# Patient Record
Sex: Male | Born: 1935
Health system: Southern US, Community
[De-identification: ages and names within clinical notes are randomized; demographics above are authoritative.]

## PROBLEM LIST (undated history)

## (undated) DIAGNOSIS — J449 Chronic obstructive pulmonary disease, unspecified: Secondary | ICD-10-CM

## (undated) DIAGNOSIS — I2510 Atherosclerotic heart disease of native coronary artery without angina pectoris: Secondary | ICD-10-CM

## (undated) DIAGNOSIS — N400 Enlarged prostate without lower urinary tract symptoms: Secondary | ICD-10-CM

## (undated) DIAGNOSIS — H9190 Unspecified hearing loss, unspecified ear: Secondary | ICD-10-CM

## (undated) DIAGNOSIS — C169 Malignant neoplasm of stomach, unspecified: Secondary | ICD-10-CM

## (undated) DIAGNOSIS — B0229 Other postherpetic nervous system involvement: Secondary | ICD-10-CM

## (undated) DIAGNOSIS — M629 Disorder of muscle, unspecified: Secondary | ICD-10-CM

## (undated) DIAGNOSIS — D649 Anemia, unspecified: Secondary | ICD-10-CM

## (undated) HISTORY — DX: Malignant neoplasm of stomach, unspecified: C16.9

## (undated) HISTORY — DX: Anemia, unspecified: D64.9

## (undated) HISTORY — DX: Other postherpetic nervous system involvement: B02.29

## (undated) HISTORY — DX: Benign prostatic hyperplasia without lower urinary tract symptoms: N40.0

## (undated) HISTORY — PX: STOMACH SURGERY: SHX791

## (undated) HISTORY — DX: Chronic obstructive pulmonary disease, unspecified: J44.9

## (undated) HISTORY — DX: Atherosclerotic heart disease of native coronary artery without angina pectoris: I25.10

## (undated) HISTORY — DX: Disorder of muscle, unspecified: M62.9

---

## 1998-10-07 ENCOUNTER — Ambulatory Visit (HOSPITAL_COMMUNITY): Admission: RE | Admit: 1998-10-07 | Discharge: 1998-10-07 | Payer: Self-pay | Admitting: Internal Medicine

## 1998-10-07 ENCOUNTER — Encounter: Payer: Self-pay | Admitting: Internal Medicine

## 2001-05-18 ENCOUNTER — Encounter: Admission: RE | Admit: 2001-05-18 | Discharge: 2001-05-18 | Payer: Self-pay | Admitting: *Deleted

## 2001-05-18 ENCOUNTER — Encounter: Payer: Self-pay | Admitting: Cardiology

## 2001-05-30 ENCOUNTER — Ambulatory Visit: Admission: RE | Admit: 2001-05-30 | Discharge: 2001-05-30 | Payer: Self-pay | Admitting: Cardiology

## 2006-05-13 ENCOUNTER — Ambulatory Visit: Payer: Self-pay | Admitting: Internal Medicine

## 2006-05-24 ENCOUNTER — Ambulatory Visit (HOSPITAL_COMMUNITY): Admission: RE | Admit: 2006-05-24 | Discharge: 2006-05-24 | Payer: Self-pay | Admitting: Internal Medicine

## 2006-05-27 ENCOUNTER — Ambulatory Visit: Payer: Self-pay

## 2007-12-18 ENCOUNTER — Ambulatory Visit: Payer: Self-pay | Admitting: Internal Medicine

## 2007-12-18 DIAGNOSIS — J439 Emphysema, unspecified: Secondary | ICD-10-CM | POA: Insufficient documentation

## 2007-12-19 ENCOUNTER — Encounter: Payer: Self-pay | Admitting: Internal Medicine

## 2007-12-19 LAB — CONVERTED CEMR LAB
Glucose, Bld: 93 mg/dL (ref 70–99)
PSA: 1.96 ng/mL (ref 0.10–4.00)

## 2008-05-27 ENCOUNTER — Ambulatory Visit: Payer: Self-pay | Admitting: Internal Medicine

## 2008-07-07 ENCOUNTER — Telehealth: Payer: Self-pay | Admitting: Internal Medicine

## 2008-07-22 ENCOUNTER — Ambulatory Visit: Payer: Self-pay | Admitting: Internal Medicine

## 2008-07-22 DIAGNOSIS — B0229 Other postherpetic nervous system involvement: Secondary | ICD-10-CM

## 2008-08-08 ENCOUNTER — Telehealth: Payer: Self-pay | Admitting: Internal Medicine

## 2008-08-09 ENCOUNTER — Ambulatory Visit: Payer: Self-pay | Admitting: Internal Medicine

## 2008-08-15 ENCOUNTER — Telehealth: Payer: Self-pay | Admitting: Internal Medicine

## 2008-08-26 ENCOUNTER — Telehealth: Payer: Self-pay | Admitting: Internal Medicine

## 2008-09-24 ENCOUNTER — Telehealth: Payer: Self-pay | Admitting: Internal Medicine

## 2008-10-21 ENCOUNTER — Ambulatory Visit: Payer: Self-pay | Admitting: Internal Medicine

## 2008-10-30 ENCOUNTER — Telehealth (INDEPENDENT_AMBULATORY_CARE_PROVIDER_SITE_OTHER): Payer: Self-pay | Admitting: *Deleted

## 2008-11-29 ENCOUNTER — Ambulatory Visit: Payer: Self-pay | Admitting: Internal Medicine

## 2008-12-24 ENCOUNTER — Ambulatory Visit: Payer: Self-pay | Admitting: Internal Medicine

## 2009-07-01 ENCOUNTER — Ambulatory Visit: Payer: Self-pay | Admitting: Internal Medicine

## 2009-07-02 LAB — CONVERTED CEMR LAB
Albumin: 4.3 g/dL (ref 3.5–5.2)
BUN: 14 mg/dL (ref 6–23)
Basophils Absolute: 0 10*3/uL (ref 0.0–0.1)
Basophils Relative: 0.3 % (ref 0.0–3.0)
CO2: 31 meq/L (ref 19–32)
Calcium: 9.4 mg/dL (ref 8.4–10.5)
Chloride: 101 meq/L (ref 96–112)
Creatinine, Ser: 1 mg/dL (ref 0.4–1.5)
Eosinophils Absolute: 0.1 10*3/uL (ref 0.0–0.7)
Eosinophils Relative: 1.1 % (ref 0.0–5.0)
Glucose, Bld: 98 mg/dL (ref 70–99)
HCT: 44.8 % (ref 39.0–52.0)
Hemoglobin: 15.3 g/dL (ref 13.0–17.0)
Lymphocytes Relative: 29.1 % (ref 12.0–46.0)
Lymphs Abs: 2.2 10*3/uL (ref 0.7–4.0)
MCHC: 34 g/dL (ref 30.0–36.0)
MCV: 88.9 fL (ref 78.0–100.0)
Monocytes Absolute: 0.5 10*3/uL (ref 0.1–1.0)
Monocytes Relative: 6.3 % (ref 3.0–12.0)
Neutro Abs: 4.6 10*3/uL (ref 1.4–7.7)
Neutrophils Relative %: 63.2 % (ref 43.0–77.0)
PSA: 1.36 ng/mL (ref 0.10–4.00)
Phosphorus: 4 mg/dL (ref 2.3–4.6)
Platelets: 187 10*3/uL (ref 150.0–400.0)
Potassium: 4.6 meq/L (ref 3.5–5.1)
RBC: 5.04 M/uL (ref 4.22–5.81)
RDW: 14 % (ref 11.5–14.6)
Sodium: 140 meq/L (ref 135–145)
TSH: 0.47 microintl units/mL (ref 0.35–5.50)
WBC: 7.4 10*3/uL (ref 4.5–10.5)

## 2009-07-23 ENCOUNTER — Encounter: Payer: Self-pay | Admitting: Internal Medicine

## 2009-07-31 ENCOUNTER — Encounter: Payer: Self-pay | Admitting: Internal Medicine

## 2009-09-02 ENCOUNTER — Encounter: Payer: Self-pay | Admitting: Internal Medicine

## 2009-10-14 ENCOUNTER — Encounter: Payer: Self-pay | Admitting: Internal Medicine

## 2010-01-06 ENCOUNTER — Encounter: Payer: Self-pay | Admitting: Internal Medicine

## 2010-07-15 ENCOUNTER — Ambulatory Visit: Payer: Self-pay | Admitting: Internal Medicine

## 2010-11-25 NOTE — Letter (Addendum)
Summary: [facility]  [facility]   Imported By: Lanelle Bal 01/22/2010 12:11:06  _____________________________________________________________________  External Attachment:    Type:   Image     Comment:   External Document  Appended Document: [facility] doing well after medial canthal procedure. Good cosmetic effect

## 2010-11-25 NOTE — Miscellaneous (Addendum)
Summary: Special Procedure  Special Procedure   Imported By: Lester Dennis 07/23/2010 09:32:38  _____________________________________________________________________  External Attachment:    Type:   Image     Comment:   External Document

## 2010-11-25 NOTE — Miscellaneous (Addendum)
Summary: med list update  Clinical Lists Changes  Medications: Added new medication of ATROVENT HFA 17 MCG/ACT  AERS (IPRATROPIUM BROMIDE HFA) 2 sprays 3-4 times a day - Signed Rx of ATROVENT HFA 17 MCG/ACT  AERS (IPRATROPIUM BROMIDE HFA) 2 sprays 3-4 times a day;  #1 x 0;  Signed;  Entered by: Lowella Petties;  Authorized by: Cindee Salt MD;  Method used: Telephoned to [facility]  [address], [place], [place], [place]  [address], Ph: [phone], Fax: [phone]    Prescriptions: ATROVENT HFA 17 MCG/ACT  AERS (IPRATROPIUM BROMIDE HFA) 2 sprays 3-4 times a day  #1 x 0   Entered by:   Lowella Petties   Authorized by:   Cindee Salt MD   Signed by:   Lowella Petties on 05/27/2008   Method used:   Telephoned to ...       [facility]  [address]       [address]       [place], [place]  [address]       Ph: [phone]       Fax: [phone]   RxID:   [id number]    Prior Medications: ASPIRIN EC 325 MG  TBEC (ASPIRIN) Take 1 tablet by mouth once a day VITAMIN B () Take 1 tablet by mouth once a day ATROVENT HFA 17 MCG/ACT  AERS (IPRATROPIUM BROMIDE HFA) 2 sprays 3-4 times a day Current Allergies: No known allergies

## 2010-11-25 NOTE — Assessment & Plan Note (Addendum)
Summary: CHECK LESION ON COLLAR BONE   Vital Signs:  Patient profile:   75 year old male Height:      66.5 inches Weight:      127 pounds BMI:     20.26 Temp:     97.9 degrees F oral Pulse rate:   84 / minute Pulse rhythm:   regular BP sitting:   122 / 60  (left arm) Cuff size:   regular  Vitals Entered By: Delilah Shan CMA Duncan Dull) (July 15, 2010 12:05 PM) CC: Check collar bone   History of Present Illness: Has mass on left collarbone that started about 3 weeks ago Growing very fast No pain No bleeding No injury  Also has wart on right 2nd finger  Foot pain along lateral right foot No known injury No new shoes no swelling just turned red  bite on inner left thigh using rubbing alcohol still red  Allergies: No Known Drug Allergies  Past History:  Past medical, surgical, family and social histories (including risk factors) reviewed for relevance to current acute and chronic problems.  Past Medical History: Reviewed history from 11/29/2008 and no changes required. COPD Post herpetic neuralgia  Past Surgical History: Reviewed history from 12/18/2007 and no changes required. Stomach surgery for ulcers--age 83  Family History: Reviewed history from 12/18/2007 and no changes required. Doesn't know family medical history 2 brothers  Social History: Reviewed history from 12/18/2007 and no changes required. 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 Quit about 2004. Heavy in past  Review of Systems       feels well otherwise no GI problems  Physical Exam  General:  alert and normal appearance.   Msk:  right foot without joint swelling Lifelong crossing of 5th toe over 4th--not tender slight redness over 5th MTP but not tender normal passive ROM Skin:  lobulated mass over medial left clavicle 1.5 x 1.5 x 0.5 cm broad base but doesn't seem to be deep under skin no  inflammation   Impression & Recommendations:  Problem # 1:  NEOPLASMS UNSPEC NATURE BONE SOFT TISSUE&SKIN (ICD-239.2) Assessment New  Procedure sterile prep 2% lido with epi-- 3cc for local anesthesia Shave excision---to path cautery with hyfrecator covered with bandaid discussed home care  Lesion did not seem to be infiltrating the dermis very far  Orders: Shave Skin Lesion 1.1-2.0 cm/trunk/arm/leg ([address])  Problem # 2:  PAIN IN JOINT, ANKLE AND FOOT (ICD-719.47) Assessment: New seems mechanical nothing to point to gout no new shoes discussed supportive care  Problem # 3:  WART, RIGHT HAND (ICD-078.10) Assessment: New  liquid nitrogen Rx for 40 seconds x 2 tolerated well discussed home care  Orders: Wart Destruct <14 ([address])  Complete Medication List: 1)  Aspirin Ec 325 Mg Tbec (Aspirin) .... Take 1 tablet by mouth once a day 2)  Magnebind 400 200-1-400 Mg Tabs (Magnesium-calcium-folic acid) .... Tke one tablet once daily 3)  Spiriva Handihaler 18 Mcg Caps (Tiotropium bromide monohydrate) .Marland Kitchen.. 1 inhalation daily  Patient Instructions: 1)  Please schedule a follow-up appointment as needed .   Current Allergies (reviewed today): No known allergies

## 2010-11-25 NOTE — Letter (Addendum)
Summary: [facility]  [facility]   Imported By: Lanelle Bal 11/22/2009 09:43:55  _____________________________________________________________________  External Attachment:    Type:   Image     Comment:   External Document  Appended Document: [facility] post op may need "flap thinning" as minor procedure to finish up

## 2011-01-06 ENCOUNTER — Ambulatory Visit (INDEPENDENT_AMBULATORY_CARE_PROVIDER_SITE_OTHER): Payer: Medicare Other | Admitting: Internal Medicine

## 2011-01-06 ENCOUNTER — Encounter: Payer: Self-pay | Admitting: Internal Medicine

## 2011-01-06 DIAGNOSIS — J449 Chronic obstructive pulmonary disease, unspecified: Secondary | ICD-10-CM

## 2011-01-12 NOTE — Assessment & Plan Note (Addendum)
Summary: Med refill   Vital Signs:  Patient profile:   76 year old male Weight:      133 pounds O2 Sat:      95 % on Room air Temp:     98.0 degrees F oral Pulse rate:   78 / minute Pulse rhythm:   regular BP sitting:   138 / 68  (left arm) Cuff size:   regular  Vitals Entered By: Mervin Hack CMA Duncan Dull) (January 06, 2011 10:18 AM)  O2 Flow:  Room air CC: med refill   History of Present Illness: Doing okay No new skin problems  still on spiriva Has finally completely stopped cigarettes  Very tough Variable amount---but could be up to 1/4 PPD  No arthritis problems  Preventive Screening-Counseling & Management  Alcohol-Tobacco     Smoking Status: quit  Allergies: No Known Drug Allergies  Past History:  Past medical, surgical, family and social histories (including risk factors) reviewed for relevance to current acute and chronic problems.  Past Medical History: Reviewed history from 11/29/2008 and no changes required. COPD Post herpetic neuralgia  Past Surgical History: Reviewed history from 12/18/2007 and no changes required. Stomach surgery for ulcers--age 72  Family History: Reviewed history from 12/18/2007 and no changes required. 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 Quit about 2004. Heavy in past Former Smoker--now completely off as of 2/12  Review of Systems       sleeps well Tries to stay active but no set exercise appetite is fine weight is stable Voids okay  Contraindications/Deferment of Procedures/Staging:    Treatment: Flu Shot    Contraindication: other     Treatment: Pneumovax    Contraindication: other   Physical Exam  General:  alert and normal appearance.   Neck:  supple, no masses, no carotid bruits, and no cervical lymphadenopathy.  Thyroid palpable but not enlarged Lungs:  normal respiratory effort, no intercostal retractions, no  accessory muscle use, no crackles, and no wheezes.  SLightly decreased breath sounds but clear Heart:  normal rate, regular rhythm, no murmur, and no gallop.   Msk:  no joint tenderness and no joint swelling.   Extremities:  no edema Psych:  normally interactive, good eye contact, not anxious appearing, and not depressed appearing.     Impression & Recommendations:  Problem # 1:  COPD (ICD-496) Assessment Unchanged stable on meds He is interested in trying without the med discussed trial without it  discussed pneumovax and flu vaccine---he will not take  His updated medication list for this problem includes:    Spiriva Handihaler 18 Mcg Caps (Tiotropium bromide monohydrate) .Marland Kitchen... 1 inhalation daily  Complete Medication List: 1)  Aspirin Ec 325 Mg Tbec (Aspirin) .... Take 1 tablet by mouth once a day 2)  Spiriva Handihaler 18 Mcg Caps (Tiotropium bromide monohydrate) .Marland Kitchen.. 1 inhalation daily  Patient Instructions: 1)  Please schedule a follow-up appointment in 1 year.  Prescriptions: SPIRIVA HANDIHALER 18 MCG CAPS (TIOTROPIUM BROMIDE MONOHYDRATE) 1 inhalation daily  #30 x 11   Entered and Authorized by:   Cindee Salt MD   Signed by:   Cindee Salt MD on 01/06/2011   Method used:   Electronically to        [facility]  [address] (retail)       [address], [address]       [place], [place]  [address]       Ph: [phone]       Fax: [phone]   RxID:   [id number]    Orders Added: 1)  Est. Patient Level III [84132]    Current Allergies (reviewed today): No known allergies

## 2011-03-12 NOTE — Assessment & Plan Note (Signed)
[facility]                                   ON-CALL NOTE   NAME:FIEGL, RUDOLPH                        MRN:          [id number]  DATE:05/14/2006                            DOB:          [birth year]    TELEPHONE TRIAGE NOTE:   TIME RECEIVED:  2:15 p.m.   PATIENT NAME:  Danny Page.  The caller is the same.  He sees Dr. Jonny Ruiz.  Telephone [phone].   The patient was seen in Dr. Raphael Gibney office yesterday for shoulder pain.  He  was to have had a prescription for hydrocodone called in to the pharmacy,  but apparently it was not.  The patient checked back this morning and they  had no record of such a prescription.  He has used hydrocodone many times in  the past.  He has no drug allergies.  My answer is to call in Vicodin 5/500  to use as needed for pain, #30, with no refills, to the [facility] in [place]  at [phone].                                   Tera Mater. Clent Ridges, MD   SAF/MedQ  DD:  05/14/2006  DT:  05/14/2006  Job #:  [id number]   cc:   Corwin Levins, MD

## 2012-01-10 ENCOUNTER — Ambulatory Visit: Payer: Medicare Other | Admitting: Internal Medicine

## 2012-01-25 ENCOUNTER — Encounter: Payer: Self-pay | Admitting: Internal Medicine

## 2012-01-26 ENCOUNTER — Encounter: Payer: Self-pay | Admitting: Internal Medicine

## 2012-01-26 ENCOUNTER — Ambulatory Visit (INDEPENDENT_AMBULATORY_CARE_PROVIDER_SITE_OTHER): Payer: Medicare Other | Admitting: Internal Medicine

## 2012-01-26 VITALS — BP 120/70 | HR 100 | Temp 98.2°F | Ht 66.0 in | Wt 154.0 lb

## 2012-01-26 DIAGNOSIS — J449 Chronic obstructive pulmonary disease, unspecified: Secondary | ICD-10-CM

## 2012-01-26 DIAGNOSIS — Z0000 Encounter for general adult medical examination without abnormal findings: Secondary | ICD-10-CM

## 2012-01-26 DIAGNOSIS — B0229 Other postherpetic nervous system involvement: Secondary | ICD-10-CM

## 2012-01-26 LAB — HEPATIC FUNCTION PANEL
AST: 26 U/L (ref 0–37)
Alkaline Phosphatase: 85 U/L (ref 39–117)
Bilirubin, Direct: 0 mg/dL (ref 0.0–0.3)
Total Bilirubin: 0.3 mg/dL (ref 0.3–1.2)

## 2012-01-26 LAB — BASIC METABOLIC PANEL
BUN: 16 mg/dL (ref 6–23)
Calcium: 9.6 mg/dL (ref 8.4–10.5)
Creatinine, Ser: 1 mg/dL (ref 0.4–1.5)
GFR: 77.53 mL/min (ref >60.00)
Glucose, Bld: 127 mg/dL — ABNORMAL HIGH (ref 70–99)

## 2012-01-26 LAB — CBC WITH DIFFERENTIAL/PLATELET
Basophils Absolute: 0.1 10*3/uL (ref 0.0–0.1)
Eosinophils Relative: 1.3 % (ref 0.0–5.0)
Lymphocytes Relative: 31.6 % (ref 12.0–46.0)
Lymphs Abs: 2.1 10*3/uL (ref 0.7–4.0)
Monocytes Relative: 7.3 % (ref 3.0–12.0)
Neutrophils Relative %: 58.4 % (ref 43.0–77.0)
Platelets: 225 10*3/uL (ref 150.0–400.0)
RDW: 17.2 % — ABNORMAL HIGH (ref 11.5–14.6)
WBC: 6.7 10*3/uL (ref 4.5–10.5)

## 2012-01-26 NOTE — Progress Notes (Signed)
Subjective:    Patient ID: Danny Page, male    DOB: [birth year], 77 y.o.   MRN: [id number]  HPI Here for physical Did stop smoking completely No falls, depression or anhedonia Reviewed advanced directives Doesn't want immunizations  Discussed spiriva He doesn't feel it does any good---he has tried to skip several days No regular cough Breathing is stable. Has to rest after walking only 100 feet  Current Outpatient Prescriptions on File Prior to Visit  Medication Sig Dispense Refill  . aspirin 325 MG tablet Take 325 mg by mouth daily.      Marland Kitchen tiotropium (SPIRIVA) 18 MCG inhalation capsule Place 18 mcg into inhaler and inhale daily.        No Known Allergies  Past Medical History  Diagnosis Date  . COPD (chronic obstructive pulmonary disease)   . Post herpetic neuralgia     Past Surgical History  Procedure Date  . Stomach surgery     for ulcers, age 7    No family history on file.  History   Social History  . Marital Status: Single    Spouse Name: N/A    Number of Children: N/A  . Years of Education: N/A   Occupational History  . retired, Art gallery manager    Social History Main Topics  . Smoking status: Former Smoker    Types: Cigarettes  . Smokeless tobacco: Never Used  . Alcohol Use: No     quit about 2004, heavy in the past  . Drug Use: No  . Sexually Active: Not on file   Other Topics Concern  . Not on file   Social History Narrative   No living willRequests wife as health care POAWould accept resuscitation attemptsWould probably accept feeding tube   Review of Systems  Constitutional: Negative for fatigue and unexpected weight change.       Tries to do some exercise--- but not much Wears seat belt  HENT: Negative for hearing loss, congestion, rhinorrhea, dental problem and tinnitus.        Regular with dentist  Eyes: Negative for visual disturbance.       No diplopia or unilateral vision loss  Respiratory: Positive for shortness of breath.  Negative for cough and chest tightness.   Cardiovascular: Negative for chest pain, palpitations and leg swelling.  Gastrointestinal: Negative for nausea, vomiting, abdominal pain, constipation and blood in stool.       No heartburn  Genitourinary: Negative for dysuria, urgency, frequency and difficulty urinating.       Only occ nocturia No sexual problems  Musculoskeletal: Negative for back pain, joint swelling and arthralgias.  Skin: Negative for pallor and rash.  Neurological: Negative for dizziness, syncope, weakness, light-headedness, numbness and headaches.       Still feels mild sensory changes from shingles behind left ear  Hematological: Negative for adenopathy. Does not bruise/bleed easily.  Psychiatric/Behavioral: Negative for sleep disturbance and dysphoric mood. The patient is not nervous/anxious.        Objective:   Physical Exam  Constitutional: He is oriented to person, place, and time. He appears well-developed and well-nourished. No distress.  HENT:  Head: Normocephalic and atraumatic.  Right Ear: External ear normal.  Left Ear: External ear normal.  Mouth/Throat: Oropharynx is clear and moist. No oropharyngeal exudate.  Eyes: Conjunctivae and EOM are normal. Pupils are equal, round, and reactive to light.  Neck: Normal range of motion. Neck supple. No thyromegaly present.  Cardiovascular: Normal rate, regular rhythm and normal heart sounds.  Exam reveals no gallop.   No murmur heard.      Very faint pedal pulses  Pulmonary/Chest: Effort normal. No respiratory distress. He has no wheezes. He has no rales.       Decreased breath sounds but clear  Abdominal: Soft. He exhibits no mass. There is no tenderness.  Musculoskeletal: Normal range of motion. He exhibits no edema and no tenderness.  Lymphadenopathy:    He has no cervical adenopathy.  Neurological: He is alert and oriented to person, place, and time.  Skin: No rash noted. No erythema.       Multiple seb  keratoses  Psychiatric: He has a normal mood and affect. His behavior is normal. Thought content normal.          Assessment & Plan:

## 2012-01-26 NOTE — Assessment & Plan Note (Signed)
Mild persistent pain but much better

## 2012-01-26 NOTE — Patient Instructions (Signed)
Try stopping the spiriva for 2 weeks or so. If you don't notice any difference, it would be okay to stay off it

## 2012-01-26 NOTE — Assessment & Plan Note (Signed)
Mild to moderate disability Not sure spiriva is helping---will try 2 weeks off as trial

## 2012-01-26 NOTE — Assessment & Plan Note (Signed)
Generally healthy Doesn't want immunizations Colon due 2015 No PSA after discussion Will check labs

## 2012-01-27 ENCOUNTER — Encounter: Payer: Self-pay | Admitting: Internal Medicine

## 2012-01-27 ENCOUNTER — Other Ambulatory Visit: Payer: Self-pay | Admitting: Internal Medicine

## 2012-01-27 DIAGNOSIS — D509 Iron deficiency anemia, unspecified: Secondary | ICD-10-CM | POA: Insufficient documentation

## 2012-02-03 ENCOUNTER — Other Ambulatory Visit: Payer: Self-pay | Admitting: Internal Medicine

## 2012-02-14 ENCOUNTER — Encounter: Payer: Self-pay | Admitting: Internal Medicine

## 2012-02-15 ENCOUNTER — Ambulatory Visit: Payer: Medicare Other | Admitting: Internal Medicine

## 2012-02-17 ENCOUNTER — Ambulatory Visit (INDEPENDENT_AMBULATORY_CARE_PROVIDER_SITE_OTHER): Payer: Medicare Other | Admitting: Internal Medicine

## 2012-02-17 ENCOUNTER — Encounter: Payer: Self-pay | Admitting: Internal Medicine

## 2012-02-17 ENCOUNTER — Other Ambulatory Visit (INDEPENDENT_AMBULATORY_CARE_PROVIDER_SITE_OTHER): Payer: Medicare Other

## 2012-02-17 VITALS — BP 142/70 | HR 68 | Ht 66.0 in | Wt 152.6 lb

## 2012-02-17 DIAGNOSIS — D509 Iron deficiency anemia, unspecified: Secondary | ICD-10-CM

## 2012-02-17 DIAGNOSIS — D649 Anemia, unspecified: Secondary | ICD-10-CM

## 2012-02-17 LAB — IBC PANEL: Iron: 16 ug/dL — ABNORMAL LOW (ref 42–165)

## 2012-02-17 LAB — FERRITIN: Ferritin: 4.1 ng/mL — ABNORMAL LOW (ref 22.0–322.0)

## 2012-02-17 MED ORDER — INTEGRA F 125-1 MG PO CAPS: 1.0000 | ORAL_CAPSULE | Freq: Every day | ORAL | Status: DC

## 2012-02-17 NOTE — Patient Instructions (Signed)
Dr. Rhea Belton highly recommends an EGD and a colonoscopy to give a  better diagnosis.  If you reconsider please call our office at [phone]  We have sent the following medications to your pharmacy for you to pick up at your convenience: Integra, please take as prescribed  Your physician has requested that you go to the basement for lab work before leaving today.

## 2012-02-17 NOTE — Progress Notes (Signed)
Subjective:    Patient ID: Danny Page, male    DOB: 1935/04/27, 77 y.o.   MRN: [id number]  HPI Danny Page is a 77 yo male with PMH of COPD in consultation at the request of Dr. Alphonsus Sias for evaluation of a new microcytic anemia. The patient has no GI complaints today. No abdominal pain. No nausea or vomiting. No anorexia or weight change. No heartburn. No dysphagia or odynophagia. No change in bowel habits. No blood in his stool or melena. No fevers or chills. He recalls a prior colonoscopy with Dr. Kinnie Scales, and approximately 2005. We have attempted to obtain this report, but it cannot be tracked down. No other sign of blood loss including no hematuria.  Review of Systems As per history of present illness, otherwise negative  Patient Active Problem List  Diagnoses  . POSTHERPETIC NEURALGIA  . COPD  . Routine general medical examination at a health care facility  . Iron deficiency anemia   Past Surgical History  Procedure Date  . Stomach surgery     for ulcers, age 89   Current Outpatient Prescriptions  Medication Sig Dispense Refill  . aspirin 325 MG tablet Take 325 mg by mouth daily.      Marland Kitchen SPIRIVA HANDIHALER 18 MCG inhalation capsule INHALE ONE DOSE EVERY DAY  30 each  11  . Fe Fum-FePoly-FA-Vit C-Vit B3 (INTEGRA F) 125-1 MG CAPS Take 1 capsule by mouth daily.  30 capsule  4   No Known Allergies  Family History  Problem Relation Age of Onset  . Heart disease Brother    History  Substance Use Topics  . Smoking status: Former Smoker    Types: Cigarettes  . Smokeless tobacco: Never Used  . Alcohol Use: No     quit about 2004, heavy in the past       Objective:   Physical Exam BP 142/70  Pulse 68  Ht 5\' 6"  (1.676 m)  Wt 152 lb 9.6 oz (69.219 kg)  BMI 24.63 kg/m2 Constitutional: Well-developed and well-nourished. No distress. HEENT: Normocephalic and atraumatic. Oropharynx is clear and moist. No oropharyngeal exudate. Conjunctivae are normal. Pupils are equal round  and reactive to light. No scleral icterus. Neck: Neck supple. Trachea midline. Cardiovascular: Normal rate, regular rhythm and intact distal pulses. No M/R/G Pulmonary/chest: Effort normal and breath sounds normal. No wheezing, rales or rhonchi. Abdominal: Soft, nontender, nondistended. Well-healed midline abdominal scar. Bowel sounds active throughout. There are no masses palpable. No hepatosplenomegaly. Extremities: no clubbing, cyanosis, or edema Lymphadenopathy: No cervical adenopathy noted. Neurological: Alert and oriented to person place and time. Skin: Skin is warm and dry. No rashes noted. Psychiatric: Normal mood and affect. Behavior is normal.  CBC    Component Value Date/Time   WBC 6.7 01/26/2012 1307   RBC 4.43 01/26/2012 1307   HGB 10.3* 01/26/2012 1307   HCT 32.6* 01/26/2012 1307   PLT 225.0 01/26/2012 1307   MCV 73.6* 01/26/2012 1307   MCHC 31.5 01/26/2012 1307   RDW 17.2* 01/26/2012 1307   LYMPHSABS 2.1 01/26/2012 1307   MONOABS 0.5 01/26/2012 1307   EOSABS 0.1 01/26/2012 1307   BASOSABS 0.1 01/26/2012 1307    CMP     Component Value Date/Time   NA 150* 01/26/2012 1307   K 4.8 01/26/2012 1307   CL 109 01/26/2012 1307   CO2 28 01/26/2012 1307   GLUCOSE 127* 01/26/2012 1307   BUN 16 01/26/2012 1307   CREATININE 1.0 01/26/2012 1307   CALCIUM 9.6 01/26/2012  1307   PROT 7.1 01/26/2012 1307   ALBUMIN 3.9 01/26/2012 1307   AST 26 01/26/2012 1307   ALT 16 01/26/2012 1307   ALKPHOS 85 01/26/2012 1307   BILITOT 0.3 01/26/2012 1307       Assessment & Plan:  77 yo male with PMH of COPD in consultation at the request of Dr. Alphonsus Sias for evaluation of a new microcytic anemia.   1. Microcytic anemia -- the patient's labs are consistent with an anemia, there is microcytosis with a high RDW all indicative of iron deficiency. I would like to perform iron studies along with a TSH and celiac panel today. I have recommended EGD and colonoscopy for evaluation of iron deficiency anemia and search for a source for possible  chronic GI blood loss. The patient declines these procedures, and would prefer a much more conservative approach. I discussed how this anemia may indicate an occult blood source including malignancy, and he understands this and still declines EGD and colonoscopy. With this in mind, I will start him on Integra 1 tablet daily. I like for you to take this for 2 months, and then I will see him back to repeat a CBC and recheck his iron stores at that time. Of course, should he have overt bleeding, he is instructed to call us or his PCP urgently.

## 2012-02-17 NOTE — Progress Notes (Signed)
Addended by: Adonis Housekeeper A on: 02/17/2012 03:29 PM   Modules accepted: Orders

## 2012-02-18 LAB — IRON AND TIBC
Iron: 15 ug/dL — ABNORMAL LOW (ref 42–165)
UIBC: 412 ug/dL — ABNORMAL HIGH (ref 125–400)

## 2012-03-06 ENCOUNTER — Ambulatory Visit (INDEPENDENT_AMBULATORY_CARE_PROVIDER_SITE_OTHER): Payer: Medicare Other | Admitting: Internal Medicine

## 2012-03-06 ENCOUNTER — Encounter: Payer: Self-pay | Admitting: Internal Medicine

## 2012-03-06 VITALS — BP 120/70 | HR 103 | Temp 97.7°F | Wt 148.0 lb

## 2012-03-06 DIAGNOSIS — K922 Gastrointestinal hemorrhage, unspecified: Secondary | ICD-10-CM

## 2012-03-06 DIAGNOSIS — R42 Dizziness and giddiness: Secondary | ICD-10-CM

## 2012-03-06 NOTE — Assessment & Plan Note (Signed)
No evidence of stroke Classic vestibular symptoms Discussed using meclizine prn Not sure it this occurred due to some GI event

## 2012-03-06 NOTE — Patient Instructions (Addendum)
Please try meclizine 25mg  up to three times a day if the vertigo comes back  Please stop the iron and your aspirin for now

## 2012-03-06 NOTE — Assessment & Plan Note (Signed)
Clearly had change in stool color 3 weeks after starting iron and it is heme positive I discussed that he now needs to be studied to see where the blood is coming from and he agrees Stop the iron for now

## 2012-03-06 NOTE — Progress Notes (Signed)
Subjective:    Patient ID: Danny Page, male    DOB: Oct 15, 1935, 77 y.o.   MRN: [id number]  HPI Went to bathroom yesterday AM When he got up, he developed rotatory vertigo, felt dizzy and scalp was prickly Had sense of shortness of breath with his COPD Had to rush to chair to sit down  Fell asleep on couch after he was able to get Awoke a couple of hours later Ate some food okay. Had to move very slowly Took it easy the rest of the day  Still noticed the vertigo this AM Has been easing up  This AM, noted dark black stool No visible blood This is first time for this color despite starting the iron ~3 weeks ago Some dizziness if he gets up too quick  Chronic tinnitus Hearing is okay  No problems with coordination or balance Chronic knee and back pain  Current Outpatient Prescriptions on File Prior to Visit  Medication Sig Dispense Refill  . aspirin 325 MG tablet Take 325 mg by mouth daily.      . Fe Fum-FePoly-FA-Vit C-Vit B3 (INTEGRA F) 125-1 MG CAPS Take 1 capsule by mouth daily.  30 capsule  4  . SPIRIVA HANDIHALER 18 MCG inhalation capsule INHALE ONE DOSE EVERY DAY  30 each  11    No Known Allergies  Past Medical History  Diagnosis Date  . COPD (chronic obstructive pulmonary disease)   . Post herpetic neuralgia     Past Surgical History  Procedure Date  . Stomach surgery     for ulcers, age 89    Family History  Problem Relation Age of Onset  . Heart disease Brother     History   Social History  . Marital Status: Single    Spouse Name: N/A    Number of Children: N/A  . Years of Education: N/A   Occupational History  . retired, Art gallery manager    Social History Main Topics  . Smoking status: Former Smoker    Types: Cigarettes  . Smokeless tobacco: Never Used  . Alcohol Use: No     quit about 2004, heavy in the past  . Drug Use: No  . Sexually Active: Not on file   Other Topics Concern  . Not on file   Social History Narrative   No living  willRequests wife as health care POAWould accept resuscitation attemptsWould probably accept feeding tube   Review of Systems No fever No sinus problems    Objective:   Physical Exam  Constitutional: He appears well-developed and well-nourished. No distress.  Eyes: Conjunctivae and EOM are normal. Pupils are equal, round, and reactive to light.       Cataracts  Limited fundus view on right, sharp on left  Neck: Normal range of motion. Neck supple. No thyromegaly present.  Cardiovascular: Normal rate, regular rhythm and normal heart sounds.  Exam reveals no gallop.   No murmur heard. Pulmonary/Chest: Effort normal and breath sounds normal. No respiratory distress. He has no wheezes. He has no rales.  Abdominal: Soft. There is no tenderness.  Genitourinary:       Dark black stool which is mildly heme positive  Musculoskeletal: He exhibits no edema and no tenderness.  Lymphadenopathy:    He has no cervical adenopathy.  Neurological: He is alert. He has normal strength. He displays no atrophy and no tremor. No cranial nerve deficit. He exhibits normal muscle tone. He displays a negative Romberg sign. Coordination and gait normal.  Finger to nose normal  Psychiatric: He has a normal mood and affect. His behavior is normal.          Assessment & Plan:

## 2012-03-07 ENCOUNTER — Telehealth: Payer: Self-pay | Admitting: Gastroenterology

## 2012-03-07 ENCOUNTER — Telehealth: Payer: Self-pay | Admitting: *Deleted

## 2012-03-07 NOTE — Telephone Encounter (Signed)
appt already made.

## 2012-03-07 NOTE — Telephone Encounter (Signed)
Spoke to Pt. About scheduling his Endo/Colon, he was hesitant at first then decided to go ahead with them. I offered him 03/09/2012 but he said he would prefer next week if possible. Pt is scheduled for 03/16/2012 @ 3pm with a pre-visit on 03/14/2012

## 2012-03-07 NOTE — Telephone Encounter (Signed)
Message copied by Florene Glen on Tue Mar 07, 2012  3:12 PM ------      Message from: Beverley Fiedler      Created: Mon Mar 06, 2012  4:02 PM       Rich      Yes, we will arrange.            Stacy,       Please arrange for EGD and colonoscopy, same day. Next available.            ----- Message -----         From: Karie Schwalbe, MD         Sent: 03/06/2012   3:14 PM           To: Beverley Fiedler, MD            Vonna Kotyk,            He had a black stool yesterday after a vertigo episode and now has heme positive stools            Can you please set up his endoscopies?            I have told him to stop aspirin and the iron for now            [employer]

## 2012-03-14 ENCOUNTER — Ambulatory Visit (AMBULATORY_SURGERY_CENTER): Payer: Medicare Other

## 2012-03-14 ENCOUNTER — Encounter: Payer: Self-pay | Admitting: Internal Medicine

## 2012-03-14 VITALS — Ht 66.0 in | Wt 150.3 lb

## 2012-03-14 DIAGNOSIS — Z860100 Personal history of colon polyps, unspecified: Secondary | ICD-10-CM

## 2012-03-14 DIAGNOSIS — Z1211 Encounter for screening for malignant neoplasm of colon: Secondary | ICD-10-CM

## 2012-03-14 DIAGNOSIS — K922 Gastrointestinal hemorrhage, unspecified: Secondary | ICD-10-CM

## 2012-03-14 DIAGNOSIS — K921 Melena: Secondary | ICD-10-CM

## 2012-03-14 DIAGNOSIS — R195 Other fecal abnormalities: Secondary | ICD-10-CM

## 2012-03-14 MED ORDER — NA SULFATE-K SULFATE-MG SULF 17.5-3.13-1.6 GM/177ML PO SOLN: 1.0000 | Freq: Once | ORAL | Status: DC

## 2012-03-14 NOTE — Progress Notes (Signed)
Pt came into office today for his pre-visit prior to this colonoscopy with Dr Rhea Belton on 03/16/12. Pt states he had a colonoscopy done 7-8 years ago with Dr Meadows(?first name) A medical release form was filled out and given to [facility]. Ulis Rias RN

## 2012-03-16 ENCOUNTER — Other Ambulatory Visit: Payer: Self-pay | Admitting: *Deleted

## 2012-03-16 ENCOUNTER — Telehealth: Payer: Self-pay | Admitting: Gastroenterology

## 2012-03-16 ENCOUNTER — Telehealth: Payer: Self-pay | Admitting: *Deleted

## 2012-03-16 ENCOUNTER — Ambulatory Visit (AMBULATORY_SURGERY_CENTER): Payer: Medicare Other | Admitting: Internal Medicine

## 2012-03-16 ENCOUNTER — Other Ambulatory Visit (INDEPENDENT_AMBULATORY_CARE_PROVIDER_SITE_OTHER): Payer: Medicare Other

## 2012-03-16 ENCOUNTER — Encounter: Payer: Self-pay | Admitting: Internal Medicine

## 2012-03-16 VITALS — BP 160/73 | HR 86 | Temp 98.0°F | Resp 16 | Ht 66.0 in | Wt 150.0 lb

## 2012-03-16 DIAGNOSIS — K3189 Other diseases of stomach and duodenum: Secondary | ICD-10-CM

## 2012-03-16 DIAGNOSIS — K922 Gastrointestinal hemorrhage, unspecified: Secondary | ICD-10-CM

## 2012-03-16 DIAGNOSIS — Z86010 Personal history of colonic polyps: Secondary | ICD-10-CM

## 2012-03-16 DIAGNOSIS — D509 Iron deficiency anemia, unspecified: Secondary | ICD-10-CM

## 2012-03-16 DIAGNOSIS — D649 Anemia, unspecified: Secondary | ICD-10-CM

## 2012-03-16 DIAGNOSIS — K319 Disease of stomach and duodenum, unspecified: Secondary | ICD-10-CM

## 2012-03-16 DIAGNOSIS — R195 Other fecal abnormalities: Secondary | ICD-10-CM

## 2012-03-16 DIAGNOSIS — Z1211 Encounter for screening for malignant neoplasm of colon: Secondary | ICD-10-CM

## 2012-03-16 LAB — CBC WITH DIFFERENTIAL/PLATELET
Basophils Absolute: 0.1 10*3/uL (ref 0.0–0.1)
Basophils Relative: 1.8 % (ref 0.0–3.0)
HCT: 24.7 % — ABNORMAL LOW (ref 39.0–52.0)
Hemoglobin: 7.8 g/dL — CL (ref 13.0–17.0)
Lymphs Abs: 1.7 10*3/uL (ref 0.7–4.0)
Monocytes Relative: 8.2 % (ref 3.0–12.0)
Neutro Abs: 4.1 10*3/uL (ref 1.4–7.7)
RBC: 3.34 Mil/uL — ABNORMAL LOW (ref 4.22–5.81)
RDW: 22.6 % — ABNORMAL HIGH (ref 11.5–14.6)

## 2012-03-16 LAB — COMPREHENSIVE METABOLIC PANEL
ALT: 14 U/L (ref 0–53)
BUN: 13 mg/dL (ref 6–23)
CO2: 26 mEq/L (ref 19–32)
Calcium: 8.7 mg/dL (ref 8.4–10.5)
Creatinine, Ser: 1 mg/dL (ref 0.4–1.5)
GFR: 79.33 mL/min (ref >60.00)
Glucose, Bld: 108 mg/dL — ABNORMAL HIGH (ref 70–99)
Total Bilirubin: 0.4 mg/dL (ref 0.3–1.2)

## 2012-03-16 MED ORDER — SODIUM CHLORIDE 0.9 % IV SOLN: 500.0000 mL | INTRAVENOUS | Status: DC

## 2012-03-16 MED ORDER — OMEPRAZOLE 40 MG PO CPDR: 40.0000 mg | DELAYED_RELEASE_CAPSULE | Freq: Two times a day (BID) | ORAL | Status: DC

## 2012-03-16 NOTE — Progress Notes (Signed)
Patient did not experience any of the following events: a burn prior to discharge; a fall within the facility; wrong site/side/patient/procedure/implant event; or a hospital transfer or hospital admission upon discharge from the facility. (G8907) Patient did not have preoperative order for IV antibiotic SSI prophylaxis. (G8918)  

## 2012-03-16 NOTE — Op Note (Signed)
[facility] [address] [place], [place]  [address]  ENDOSCOPY PROCEDURE REPORT  PATIENT:  Danny Page, Danny Page  MR#:  [id number] BIRTHDATE:  12-Jan-1935, 76 yrs. old  GENDER:  male ENDOSCOPIST:  Carie Caddy. Janayia Burggraf, MD Referred by:  Tillman Abide, M.D. PROCEDURE DATE:  03/16/2012 PROCEDURE:  EGD with biopsy, 43239 ASA CLASS:  Class III INDICATIONS:  iron deficiency anemia, melena MEDICATIONS:   MAC sedation, administered by CRNA, propofol (Diprivan) 150 mg IV TOPICAL ANESTHETIC:  Cetacaine Spray  DESCRIPTION OF PROCEDURE:   After the risks benefits and alternatives of the procedure were thoroughly explained, informed consent was obtained.  The LB GIF-H180 [id number] endoscope was introduced through the mouth and advanced to the proximal jejunum, without limitations.  The instrument was slowly withdrawn as the mucosa was fully examined. <<PROCEDUREIMAGES>>  The esophagus and gastroesophageal junction were completely normal in appearance.  S/P partial gastrectomy with Billroth II anatomy was found. The gastrojejunal anastomosis was intact.  There were a few shallow erosions near the anastomosis. A mass, firm and friable, was found in the body of the stomach, extending from the anastomosis into the gastric cardia. Multiple biopsies were obtained and sent to pathology.    Retroflexed views revealed findings as previously described and a hiatal hernia.    The scope was then withdrawn from the patient and the procedure completed.  COMPLICATIONS:  None  ENDOSCOPIC IMPRESSION: 1) Normal esophagus 2) S/p partial gastrectomy with Billroth II anatomy. 3) Mass in the body of the stomach, extending from near the anastomosis into the gastric cardia.  Multiple biopsies taken. 4) A few gastric erosions 5) Small hiatus hernia  RECOMMENDATIONS: 1) Await pathology results, sent rush. 2) Avoid NSAIDs 3) CBC today 4) Begin omeprazole 40 mg twice daily.  Carie Caddy. Rhea Belton, MD  CC:  The  Patient Karie Schwalbe, MD  n. Rosalie DoctorCarie Caddy. Jahseh Lucchese at 03/16/2012 03:20 PM  Vella Kohler, [phone]

## 2012-03-16 NOTE — Op Note (Signed)
[facility] [address] [place], [place]  [address]  COLONOSCOPY PROCEDURE REPORT  PATIENT:  Danny, Page  MR#:  [id number] BIRTHDATE:  September 06, 1935, 76 yrs. old  GENDER:  male ENDOSCOPIST:  Carie Caddy. Amiliana Foutz, MD REF. BY:  Tillman Abide, M.D. PROCEDURE DATE:  03/16/2012 PROCEDURE:  Incomplete colonoscopy ASA CLASS:  Class III INDICATIONS:  melena, Iron deficiency anemia MEDICATIONS:   MAC sedation, administered by CRNA, propofol (Diprivan) 50 mg IV  DESCRIPTION OF PROCEDURE:   After the risks benefits and alternatives of the procedure were thoroughly explained, informed consent was obtained.  Digital rectal exam was performed and revealed Stool palpable on digital rectal examination and no rectal masses.   The LB CF-H180AL [id number] endoscope was introduced through the anus and advanced to the sigmoid colon, limited by poor preparation.    The quality of the prep was Suprep poor.  The instrument was then slowly withdrawn as the colon was fully examined. <<PROCEDUREIMAGES>>  FINDINGS:  Poor prep limited this examination and exam aborted in distal sigmoid due to inadequate bowel preparation.   Retroflexion was nor performed. The scope was then withdrawn from the sigmoid and the procedure completed.  COMPLICATIONS:  None  ENDOSCOPIC IMPRESSION: 1) Poor prep with thick black stool in the rectum and distal sigmoid.  RECOMMENDATIONS: 1) Avoid all NSAIDS. 2) CBC today 3) Await pathology from EGD. Repeat colonoscopy with better preparation.  Carie Caddy. Rhea Belton, MD  CC:  Karie Schwalbe, MD The Patient  n. eSIGNED:   Carie Caddy. Felipe Cabell at 03/16/2012 03:25 PM  Vella Kohler, [phone]

## 2012-03-16 NOTE — Telephone Encounter (Signed)
[place] lab called said pt's Hemoglobin is 7.8  Hematocrit is 24.7, I have made Dr. Rhea Belton aware.

## 2012-03-16 NOTE — Telephone Encounter (Signed)
Ordered CMET for CT scan tomorrow at [facility] at 09:30am. Instructions left with [facility] nurse.

## 2012-03-16 NOTE — Progress Notes (Signed)
1510 INCOMPLETE EXAM RELATED TO POOR PREP.

## 2012-03-16 NOTE — Patient Instructions (Signed)
Discharge instructions given with verbal understanding. Handouts on hiatal hernia given. Resume previous medications. Labs ordered for today. Dr. Margretta Sidle office will schedule repeat colonoscopy after biopsies result.YOU HAD AN ENDOSCOPIC PROCEDURE TODAY AT THE [facility]: Refer to the procedure report that was given to you for any specific questions about what was found during the examination.  If the procedure report does not answer your questions, please call your gastroenterologist to clarify.  If you requested that your care partner not be given the details of your procedure findings, then the procedure report has been included in a sealed envelope for you to review at your convenience later.  YOU SHOULD EXPECT: Some feelings of bloating in the abdomen. Passage of more gas than usual.  Walking can help get rid of the air that was put into your GI tract during the procedure and reduce the bloating. If you had a lower endoscopy (such as a colonoscopy or flexible sigmoidoscopy) you may notice spotting of blood in your stool or on the toilet paper. If you underwent a bowel prep for your procedure, then you may not have a normal bowel movement for a few days.  DIET: Your first meal following the procedure should be a light meal and then it is ok to progress to your normal diet.  A half-sandwich or bowl of soup is an example of a good first meal.  Heavy or fried foods are harder to digest and may make you feel nauseous or bloated.  Likewise meals heavy in dairy and vegetables can cause extra gas to form and this can also increase the bloating.  Drink plenty of fluids but you should avoid alcoholic beverages for 24 hours.  ACTIVITY: Your care partner should take you home directly after the procedure.  You should plan to take it easy, moving slowly for the rest of the day.  You can resume normal activity the day after the procedure however you should NOT DRIVE or use heavy machinery for 24 hours  (because of the sedation medicines used during the test).    SYMPTOMS TO REPORT IMMEDIATELY: A gastroenterologist can be reached at any hour.  During normal business hours, 8:30 AM to 5:00 PM Monday through Friday, call [phone].  After hours and on weekends, please call the GI answering service at [phone] who will take a message and have the physician on call contact you.   Following lower endoscopy (colonoscopy or flexible sigmoidoscopy):  Excessive amounts of blood in the stool  Significant tenderness or worsening of abdominal pains  Swelling of the abdomen that is new, acute  Fever of 100F or higher  Following upper endoscopy (EGD)  Vomiting of blood or coffee ground material  New chest pain or pain under the shoulder blades  Painful or persistently difficult swallowing  New shortness of breath  Fever of 100F or higher  Black, tarry-looking stools  FOLLOW UP: If any biopsies were taken you will be contacted by phone or by letter within the next 1-3 weeks.  Call your gastroenterologist if you have not heard about the biopsies in 3 weeks.  Our staff will call the home number listed on your records the next business day following your procedure to check on you and address any questions or concerns that you may have at that time regarding the information given to you following your procedure. This is a courtesy call and so if there is no answer at the home number and we have not heard from  you through the emergency physician on call, we will assume that you have returned to your regular daily activities without incident.  SIGNATURES/CONFIDENTIALITY: You and/or your care partner have signed paperwork which will be entered into your electronic medical record.  These signatures attest to the fact that that the information above on your After Visit Summary has been reviewed and is understood.  Full responsibility of the confidentiality of this discharge information lies with you  and/or your care-partner.

## 2012-03-17 ENCOUNTER — Telehealth: Payer: Self-pay | Admitting: *Deleted

## 2012-03-17 ENCOUNTER — Other Ambulatory Visit: Payer: Self-pay | Admitting: Internal Medicine

## 2012-03-17 ENCOUNTER — Inpatient Hospital Stay (HOSPITAL_COMMUNITY)
Admission: AD | Admit: 2012-03-17 | Discharge: 2012-03-17 | DRG: 812 | Disposition: A | Discharge status: Home / Self Care | Payer: Medicare Other | Source: Ambulatory Visit | Attending: Internal Medicine | Admitting: Internal Medicine

## 2012-03-17 ENCOUNTER — Encounter (HOSPITAL_COMMUNITY): Payer: Self-pay | Admitting: Nurse Practitioner

## 2012-03-17 ENCOUNTER — Ambulatory Visit (INDEPENDENT_AMBULATORY_CARE_PROVIDER_SITE_OTHER)
Admission: RE | Admit: 2012-03-17 | Discharge: 2012-03-17 | Disposition: A | Discharge status: Home / Self Care | Payer: Medicare Other | Source: Ambulatory Visit | Attending: Internal Medicine | Admitting: Internal Medicine

## 2012-03-17 DIAGNOSIS — J4489 Other specified chronic obstructive pulmonary disease: Secondary | ICD-10-CM | POA: Diagnosis present

## 2012-03-17 DIAGNOSIS — Z7982 Long term (current) use of aspirin: Secondary | ICD-10-CM

## 2012-03-17 DIAGNOSIS — D509 Iron deficiency anemia, unspecified: Principal | ICD-10-CM | POA: Diagnosis present

## 2012-03-17 DIAGNOSIS — K922 Gastrointestinal hemorrhage, unspecified: Secondary | ICD-10-CM

## 2012-03-17 DIAGNOSIS — J449 Chronic obstructive pulmonary disease, unspecified: Secondary | ICD-10-CM | POA: Diagnosis present

## 2012-03-17 DIAGNOSIS — C162 Malignant neoplasm of body of stomach: Secondary | ICD-10-CM | POA: Diagnosis present

## 2012-03-17 DIAGNOSIS — Z903 Acquired absence of stomach [part of]: Secondary | ICD-10-CM

## 2012-03-17 DIAGNOSIS — Z8711 Personal history of peptic ulcer disease: Secondary | ICD-10-CM

## 2012-03-17 DIAGNOSIS — D649 Anemia, unspecified: Secondary | ICD-10-CM

## 2012-03-17 DIAGNOSIS — C169 Malignant neoplasm of stomach, unspecified: Secondary | ICD-10-CM

## 2012-03-17 DIAGNOSIS — B0229 Other postherpetic nervous system involvement: Secondary | ICD-10-CM | POA: Diagnosis present

## 2012-03-17 LAB — ABO/RH: ABO/RH(D): A POS

## 2012-03-17 LAB — COMPREHENSIVE METABOLIC PANEL
AST: 13 U/L (ref 0–37)
Albumin: 3.4 g/dL — ABNORMAL LOW (ref 3.5–5.2)
Calcium: 8.1 mg/dL — ABNORMAL LOW (ref 8.4–10.5)
Creatinine, Ser: 1 mg/dL (ref 0.50–1.35)
Sodium: 134 mEq/L — ABNORMAL LOW (ref 135–145)
Total Protein: 6.4 g/dL (ref 6.0–8.3)

## 2012-03-17 LAB — CBC
MCV: 74.5 fL — ABNORMAL LOW (ref 78.0–100.0)
Platelets: 331 10*3/uL (ref 150–400)
RDW: 19.8 % — ABNORMAL HIGH (ref 11.5–15.5)
WBC: 6.7 10*3/uL (ref 4.0–10.5)

## 2012-03-17 LAB — PREPARE RBC (CROSSMATCH)

## 2012-03-17 LAB — DIFFERENTIAL
Basophils Absolute: 0.1 10*3/uL (ref 0.0–0.1)
Eosinophils Relative: 1 % (ref 0–5)
Lymphocytes Relative: 25 % (ref 12–46)

## 2012-03-17 MED ORDER — ONDANSETRON HCL 4 MG PO TABS: 4.0000 mg | ORAL_TABLET | Freq: Four times a day (QID) | ORAL | Status: DC | PRN

## 2012-03-17 MED ORDER — IOHEXOL 300 MG/ML  SOLN
100.0000 mL | Freq: Once | INTRAMUSCULAR | Status: AC | PRN
  Administered 2012-03-17: 100 mL via INTRAVENOUS

## 2012-03-17 MED ORDER — SODIUM CHLORIDE 0.9 % IJ SOLN: 3.0000 mL | Freq: Two times a day (BID) | INTRAMUSCULAR | Status: DC

## 2012-03-17 MED ORDER — SODIUM CHLORIDE 0.9 % IJ SOLN: 3.0000 mL | INTRAMUSCULAR | Status: DC | PRN

## 2012-03-17 MED ORDER — SODIUM CHLORIDE 0.9 % IV SOLN: 250.0000 mL | INTRAVENOUS | Status: DC | PRN

## 2012-03-17 MED ORDER — ONDANSETRON HCL 4 MG/2ML IJ SOLN: 4.0000 mg | Freq: Four times a day (QID) | INTRAMUSCULAR | Status: DC | PRN

## 2012-03-17 NOTE — Telephone Encounter (Signed)
Spoke with Danny Page this am to inform him he needs a blood transfusion. No room at medical day at [facility]. Called [facility] admitting who states there shouldn't be a problem with 24hr obs bed. Danny Page informed and if he has left for his CT, I'm to call his wife's cell, [phone].  Kim at [facility] admitting called back with Room 1502 at [facility], informed Danny Page's wife and Willette Cluster, NP. Transfusion orders in.

## 2012-03-17 NOTE — Telephone Encounter (Signed)
Notified wife of referral to [facility] and the appt. Also informed her someone from the cancer center will be calling for an appt; she stated understanding.

## 2012-03-17 NOTE — H&P (Signed)
Primary Care Physician:  Tillman Abide, MD, MD Primary Gastroenterologist:  Erick Blinks, MD  CHIEF COMPLAINT:  anemia  HPI: Danny Page is a 77 y.o. male seen by Dr. Rhea Belton late April for evaluation of anemia.  evaluation of black stools. Stools were black, heme positive. He subsequently underwent EGD and colonoscopy. He had thick black stool in colon. On EGD he had a firm mass extending from anastomosis into cardia. His anemia has progressed. Hemoglobin 7.8 yesterday, down from 10.3 one month ago. He is being admitted for observation to get a blood transfusion. Patient denies nausea, abdominal pain, or significant weight loss.   Past Medical History  Diagnosis Date  . COPD (chronic obstructive pulmonary disease)   . Post herpetic neuralgia   . Hemorrhoids     Past Surgical History  Procedure Date  . Bilroth II     for ulcers, age 33    Prior to Admission medications   Medication Sig Start Date End Date Taking? Authorizing Provider  aspirin 325 MG tablet Take 325 mg by mouth daily.    Historical Provider, MD  Fe Fum-FePoly-FA-Vit C-Vit B3 (INTEGRA F) 125-1 MG CAPS Take 1 capsule by mouth daily. 02/17/12   Beverley Fiedler, MD  omeprazole (PRILOSEC) 40 MG capsule Take 1 capsule (40 mg total) by mouth 2 (two) times daily. Please take 30 minutes before your first and last meal of the day. 03/16/12 03/16/13  Beverley Fiedler, MD  SPIRIVA HANDIHALER 18 MCG inhalation capsule INHALE ONE DOSE EVERY DAY 02/03/12   Karie Schwalbe, MD    Current Facility-Administered Medications  Medication Dose Route Frequency Provider Last Rate Last Dose  . ondansetron (ZOFRAN) tablet 4 mg  4 mg Oral Q6H PRN Meredith Pel, NP       Or  . ondansetron [facility]) injection 4 mg  4 mg Intravenous Q6H PRN Meredith Pel, NP      . DISCONTD: 0.9 %  sodium chloride infusion  250 mL Intravenous PRN Meredith Pel, NP      . DISCONTD: ondansetron (ZOFRAN) injection 4 mg  4 mg Intravenous Q6H PRN Meredith Pel, NP       . DISCONTD: ondansetron (ZOFRAN) tablet 4 mg  4 mg Oral Q6H PRN Meredith Pel, NP      . DISCONTD: sodium chloride 0.9 % injection 3 mL  3 mL Intravenous Q12H Meredith Pel, NP      . DISCONTD: sodium chloride 0.9 % injection 3 mL  3 mL Intravenous PRN Meredith Pel, NP       Facility-Administered Medications Ordered in Other Encounters  Medication Dose Route Frequency Provider Last Rate Last Dose  . iohexol (OMNIPAQUE) 300 MG/ML solution 100 mL  100 mL Intravenous Once PRN Medication Radiologist, MD   100 mL at 03/17/12 1003    Allergies as of 03/17/2012  . (No Known Allergies)    Family History  Problem Relation Age of Onset  . Heart disease Brother     History   Social History  . Marital Status: Married    Spouse Name: N/A    Number of Children: N/A  . Years of Education: N/A   Occupational History  . retired, Art gallery manager    Social History Main Topics  . Smoking status: Former Smoker -- 45 years    Types: Cigarettes    Quit date: 07/14/2008  . Smokeless tobacco: Never Used  . Alcohol Use: No     quit about 2004,  heavy in the past  . Drug Use: No  . Sexually Active: Not on file    Social History Narrative   No living willRequests wife as health care POAWould accept resuscitation attemptsWould probably accept feeding tube    Review of Systems: Positive for dizziness. All other systems reviewed and negative except where noted in HPI  Physical Exam: Vital signs in last 24 hours: Temp:  [97.5 F (36.4 C)-98 F (36.7 C)] 97.5 F (36.4 C) (05/24 1128) Pulse Rate:  [86-93] 93  (05/24 1128) Resp:  [16-17] 17  (05/24 1128) BP: (138-160)/(68-73) 138/68 mmHg (05/24 1128) SpO2:  [99 %] 99 % (05/24 1128) Weight:  [146 lb 6.2 oz (66.4 kg)-150 lb (68.04 kg)] 146 lb 6.2 oz (66.4 kg) (05/24 1128)   General:   Well-developed,white male in NAD Head:  Normocephalic and atraumatic. Eyes:  Sclera clear, no icterus.   Conjunctiva pale. Neck:  Supple; no  masses. Lungs:  Clear throughout to auscultation.   No wheezes, crackles, or rhonchi. No acute distress. Heart:  Regular rate and rhythm Abdomen:  Soft, nontender and nondistended. No masses, hepatosplenomegaly or hernias noted. Normal bowel sounds, without guarding, and without rebound.   Rectal:  Not done.   Msk:  Symmetrical without gross deformities. Pulses:  Normal pulses noted. Extremities:  Without clubbing or edema. Neurologic:  Alert and  oriented;  grossly normal neurologically. Skin:  Intact without significant lesions or rashes. Cervical Nodes:  No significant cervical adenopathy. Psych:  Alert and cooperative. Normal mood and affect.  Lab Results:  [facility] 03/16/12 1603  WBC 6.5  HGB 7.8 cL*  HCT 24.7 aL*  PLT 371.0   BMET  Basename 03/16/12 1603  NA 137  K 5.0  CL 102  CO2 26  GLUCOSE 108*  BUN 13  CREATININE 1.0  CALCIUM 8.7   LFT  Basename 03/16/12 1603  PROT 6.7  ALBUMIN 3.7  AST 18  ALT 14  ALKPHOS 76  BILITOT 0.4  BILIDIR --  IBILI --    Impression / Plan:   21. 77 year old male with newly diagnosed gastric cancer (just spoke with pathologist). Our office is arranging outpatient consultations with surgery and oncology. I did discuss diagnosis and management plans with patient and his wife.   2. Progressive iron deficiency anemia. Hemoglobin down almost 3 grams in one month. Patient to get transfused. No availability in short stay so will be admitted for observational stay. Hopefully home following transfusion.  3. History of Bilroth II age 65 for ulcer disease.   4. COPD, mild to moderate per PCP.    LOS: 0 days   Willette Cluster  03/17/2012, 11:52 AM

## 2012-03-17 NOTE — Telephone Encounter (Signed)
Message copied by Florene Glen on Fri Mar 17, 2012  2:49 PM ------      Message from: Marnette Burgess      Created: Fri Mar 17, 2012  2:17 PM      Regarding: Referral       Patient is scheduled for 03/21/12 @ 4:00pm, arrive @ 3:30pm.  Patient has been notified via voicemail.  If you have any questions please call [phone].            Thank You,      Elane Fritz      ----- Message -----         From: Linna Hoff, RN         Sent: 03/17/2012   1:59 PM           To: Rollen Sox, can you look at referral and give me an appt please. He has had a partial gastrectomy and Billroth II procedure if that makes any difference. Thanks.

## 2012-03-17 NOTE — Progress Notes (Signed)
UR completed 

## 2012-03-17 NOTE — Telephone Encounter (Signed)
No answer at telephone number given during admission. No answering machine.

## 2012-03-17 NOTE — Telephone Encounter (Signed)
Made referrals to [facility] and [facility] per Dr Rhea Belton

## 2012-03-17 NOTE — H&P (Signed)
Chart was reviewed and patient was examined. X-rays were reviewed.    I agree with management and plans.  Star Cheese D. Lawayne Hartig, M.D., FACG  

## 2012-03-18 LAB — TYPE AND SCREEN
Antibody Screen: NEGATIVE
Unit division: 0

## 2012-03-21 ENCOUNTER — Ambulatory Visit (INDEPENDENT_AMBULATORY_CARE_PROVIDER_SITE_OTHER): Payer: Medicare Other | Admitting: Surgery

## 2012-03-21 ENCOUNTER — Telehealth: Payer: Self-pay | Admitting: Oncology

## 2012-03-21 ENCOUNTER — Telehealth: Payer: Self-pay | Admitting: *Deleted

## 2012-03-21 ENCOUNTER — Encounter (INDEPENDENT_AMBULATORY_CARE_PROVIDER_SITE_OTHER): Payer: Self-pay | Admitting: Surgery

## 2012-03-21 VITALS — BP 108/62 | HR 88 | Temp 97.4°F | Resp 14 | Ht 66.0 in | Wt 147.4 lb

## 2012-03-21 DIAGNOSIS — C169 Malignant neoplasm of stomach, unspecified: Secondary | ICD-10-CM

## 2012-03-21 NOTE — Telephone Encounter (Signed)
lmonvm adviisng the pt of a new pt appt with dr Truett Perna for this Friday if he is available. Pt will need to call back to confirm

## 2012-03-21 NOTE — Progress Notes (Signed)
Patient ID: Danny Page, male   DOB: 06/11/1935, 77 y.o.   MRN: [id number]  Chief Complaint  Patient presents with  . New Evaluation    New Pt.     HPI Danny Page is a 77 y.o. male.   HPIPatient sent request of Dr. Rhea Belton for gastric cancer. He was found to have anemia and upper endoscopy showed a large mass in the cardia of the stomach which extended down to a Billroth II anastomosis. He had an ulcer he was 77 years old and this was done in [place] post World War II.  He denies any nausea, or vomiting or abdominal pain. Anemia was the only symptom.  Past Medical History  Diagnosis Date  . COPD (chronic obstructive pulmonary disease)   . Post herpetic neuralgia   . Hemorrhoids     Past Surgical History  Procedure Date  . Stomach surgery     for ulcers, age 27    Family History  Problem Relation Age of Onset  . Heart disease Brother     Social History History  Substance Use Topics  . Smoking status: Former Smoker -- 45 years    Types: Cigarettes    Quit date: 07/14/2008  . Smokeless tobacco: Never Used  . Alcohol Use: No     quit about 2004, heavy in the past    No Known Allergies  Current Outpatient Prescriptions  Medication Sig Dispense Refill  . SPIRIVA HANDIHALER 18 MCG inhalation capsule INHALE ONE DOSE EVERY DAY  30 each  11    Review of Systems Review of Systems  Constitutional: Negative.   HENT: Negative.   Eyes: Negative.   Respiratory: Negative.   Cardiovascular: Negative.   Gastrointestinal: Negative.   Genitourinary: Negative.   Musculoskeletal: Negative.   Neurological: Negative.   Hematological: Negative.   Psychiatric/Behavioral: Negative.     Blood pressure 108/62, pulse 88, temperature 97.4 F (36.3 C), temperature source Temporal, resp. rate 14, height 5\' 6"  (1.676 m), weight 147 lb 6.4 oz (66.86 kg).  Physical Exam Physical Exam  Constitutional: He is oriented to person, place, and time. He appears well-developed and  well-nourished.  HENT:  Head: Normocephalic and atraumatic.  Eyes: EOM are normal. Pupils are equal, round, and reactive to light.  Neck: Normal range of motion. Neck supple.  Cardiovascular: Normal rate and regular rhythm.   Pulmonary/Chest: Effort normal. He has wheezes.  Abdominal: Soft. Bowel sounds are normal. There is no tenderness.    Musculoskeletal: Normal range of motion.  Neurological: He is alert and oriented to person, place, and time.  Skin: Skin is warm and dry.  Psychiatric: He has a normal mood and affect. His behavior is normal. Judgment and thought content normal.    Data Reviewed CT scan  Abdomen pelvis no evidence of metastatic disease Endoscopy   Tumor from B2 to cardia of stomach.  Path invasive adenocarcinoma   Assessment    Gastric cancer Anemia Previous Bilroth 2 reconstruction.    Plan    I have asked Dr Donell Beers to see him due to location of tumor and he will probably  need completion gastrectomy and complex reconstruction.  He is not convinced he needs a tototal gastrectomy.  I drew him a picture to help explain the process.        Danny Page A. 03/21/2012, 4:46 PM

## 2012-03-21 NOTE — Telephone Encounter (Signed)
Received Oncology referral today.  Phone call to patient to introduce RN navigator role and give contact phone number.  Patient should receive call soon re: appointment with Oncology for this week.  Patient verbalized understanding.

## 2012-03-21 NOTE — Patient Instructions (Signed)
Gastric Cancer   Gastric cancer is a tumor which starts as a growth in your stomach. Cancer is a group of many related diseases that begin in cells, the building blocks of the body. Normally, cells grow and divide to produce more cells only when the body needs them. Sometimes, cells keep dividing when new cells are not needed. These extra cells may form a mass of tissue called a growth or tumor. Tumors can be either benign (not cancerous) or malignant (cancerous). Cancer can begin in any organ or tissue of the body. The original tumor (where the tumor started out) is called the primary cancer and is usually named for where it begins.   Several types of cancer can occur in the stomach. Adenocarcinoma is the most common, accounting for about 95% of gastric tumors. Other cancer types include carcinoid tumors, lymphoma or gastrointestinal stromal cell tumors (GISTs).   CAUSES   Though the exact cause of gastric cancer is not known, there are several known risk factors:   Age over 72.   Male sex.   Race: more common in Asian, Pacific Islander, Hispanic and African American people.   Diet high in smoked, salted or pickled foods.   Tobacco and alcohol use.   History of stomach surgery, chronic gastritis, gastric polyps or pernicious anemia.   Stomach infection with H. pylori bacteria (which also increases risk for ulcers).   Genetic factors including family history and blood type A.  Note that very few people with risk factors actually develop gastric cancer.  SYMPTOMS    Pain.   Loss of appetite.   Problems swallowing.   Nausea and vomiting.   Vomiting blood.   Abdominal pain.   Excessive gas or belching.   Weight loss.   General health problems.  DIAGNOSIS   Your caregiver may suspect gastric cancer based on your symptoms and your physical exam. Further testing can diagnose gastric cancer. This may include looking for blood in your stool. Gastroscopy (looking at your stomach through an instrument like a  thin flexible telescope; also called endoscopy) may also be done. Biopsies can be done if an abnormal growth is found. This is the removal of a small piece of tissue from your stomach if your caregiver notices abnormalities or growths there. The biopsy is looked at under a microscope by a specialist who can tell if cancer is present. If cancer is confirmed, other tests may be needed to see if the cancer has spread beyond the stomach.  TREATMENT    Surgical removal of the stomach (gastrectomy) is the only curative treatment. Sometimes only part of the stomach needs to be removed, depending on location of the cancer. Gastrectomy can be done if the cancer is found before it has spread beyond the stomach.   Radiation therapy and chemotherapy may be helpful. Chemotherapy and radiation therapy given after surgery may improve cure rates or make you feel better.   Antibiotics are sometimes used for H.pylori infection.   Advanced techniques to remove or destroy cancer without surgery are being researched.   Feeding tubes or bypass surgery may help if food becomes blocked.  The possibility of curing your gastric cancer depends on the type of tumor, the location of the tumor, and whether or not the tumor has spread beyond the stomach. If your cancer cannot be cured, treatment may slow the progression of the disease. Treatments are also available to address pain or other symptoms of cancer.  HOME CARE INSTRUCTIONS      Your caregiver may prescribe a specific type of diet. If you have had surgery, then a dietician may help you with an eating plan. Avoid red meats, processed meats, and salty, smoked or pickled foods.   Take any prescribed medications as directed. Do not use more pain medication than directed.   You do not need to limit your activity unless instructed by your caregiver.   Avoid alcohol and tobacco use.   Keep appointments for tests and with your caregiver or specialists.  SEEK MEDICAL CARE IF:    You have  problems eating.   You have problems tolerating your medications.   You continue to lose weight despite your treatments.  SEEK IMMEDIATE MEDICAL CARE IF:    You have uncontrolled nausea, vomiting or diarrhea.   You have vomiting with blood or coffee-grounds type material.   You have had chemotherapy and you have a fever.   You have uncontrolled pain.  Document Released: 07/15/2004 Document Revised: 09/30/2011 Document Reviewed: 10/22/2008  Exit[employer] Patient Information 2012 [employer].

## 2012-03-22 ENCOUNTER — Telehealth: Payer: Self-pay | Admitting: Oncology

## 2012-03-22 NOTE — Telephone Encounter (Signed)
S/w the pt and he is aware of his new pt appt with dr Truett Perna on 03/24/2012

## 2012-03-24 ENCOUNTER — Ambulatory Visit (HOSPITAL_BASED_OUTPATIENT_CLINIC_OR_DEPARTMENT_OTHER): Payer: Medicare Other | Admitting: Oncology

## 2012-03-24 ENCOUNTER — Ambulatory Visit: Payer: Medicare Other

## 2012-03-24 ENCOUNTER — Telehealth: Payer: Self-pay | Admitting: Oncology

## 2012-03-24 ENCOUNTER — Encounter: Payer: Self-pay | Admitting: Oncology

## 2012-03-24 ENCOUNTER — Ambulatory Visit: Payer: Medicare Other | Admitting: Oncology

## 2012-03-24 VITALS — BP 123/68 | HR 78 | Temp 97.0°F | Ht 66.0 in | Wt 146.2 lb

## 2012-03-24 DIAGNOSIS — D509 Iron deficiency anemia, unspecified: Secondary | ICD-10-CM

## 2012-03-24 DIAGNOSIS — C160 Malignant neoplasm of cardia: Secondary | ICD-10-CM

## 2012-03-24 DIAGNOSIS — L98499 Non-pressure chronic ulcer of skin of other sites with unspecified severity: Secondary | ICD-10-CM

## 2012-03-24 DIAGNOSIS — C169 Malignant neoplasm of stomach, unspecified: Secondary | ICD-10-CM

## 2012-03-24 DIAGNOSIS — J4489 Other specified chronic obstructive pulmonary disease: Secondary | ICD-10-CM

## 2012-03-24 DIAGNOSIS — J449 Chronic obstructive pulmonary disease, unspecified: Secondary | ICD-10-CM

## 2012-03-24 NOTE — Progress Notes (Signed)
[facility] New Patient Consult   Referring MD: Danny Page   Danny Page 76 y.o.  09/07/1935    Reason for Referral: Gastric cancer     HPI: He saw Dr. Letvak for evaluation of malaise and "dizziness ". A CBC on 01/26/2012 was remarkable for a hemoglobin of 10.3 with an MCV of 73.6.  He was referred to Dr. Pyrtle and underwent an upper endoscopy on may 23rd 2013. The esophagus and gastroesophageal junction appeared normal. He is status post a partial gastrectomy with a Billroth II anatomy. The gastrojejunal anastomosis was intact. A few shallow erosions were noted near the anastomosis. A firm mass was found in the body of the stomach extending from the anastomosis into the gastric cardia. Multiple biopsies were obtained. There was a small hiatal hernia. A colonoscopy on the same day was limited by poor preparation.  The biopsy from the stomach mass was positive for adenocarcinoma.  A CT of the abdomen and pelvis on 03/17/2012 revealed an enhancing mass at the medial wall of the stomach extending to the gastrojejunal anastomosis. No findings to suggest involvement of the small bowel or perigastric fat. No enlarged gastrohepatic or celiac nodes. No evidence of hepatic metastatic disease. The pancreas and spleen appeared normal. Small bilateral adrenal nodules were felt to be adenomatous. No mesenteric or retroperitoneal mass or adenopathy. The small bowel and colon were unremarkable. The lung bases were clear.  He was referred to Dr. Cornett. Dr. Cornett has referred the patient to Dr. Byerly for surgical oncology care.  The hemoglobin returned at 7.2 on may 24th 2013. He reports being transfused with 2 units of packed red blood cells. The fatigue and "dizziness "resolved following the transfusion.  Past Medical History  Diagnosis Date  . COPD (chronic obstructive pulmonary disease)       . Hemorrhoids   . Anemia-iron deficiency anemia   April 2013   . Stomach  cancer  may 23rd 2013    .    History of colon polyps  .    Zoster rash over the left face and neck 2-3 years ago Past Surgical History  Procedure Date  . Stomach surgery     for ulcers, age 18   .    Right knee surgery following trauma  .    Hemorrhoid surgery 10-15 years ago  Family History  Problem Relation Age of Onset  . Heart disease Brother     .   Breast cancer                                                                             Mother  Current outpatient prescriptions:SPIRIVA HANDIHALER 18 MCG inhalation capsule, INHALE ONE DOSE EVERY DAY, Disp: 30 each, Rfl: 11  Allergies: No Known Allergies  Social History: He lives in [place]. He previously worked as an engineer. No transfusion history prior to the May 2013 red cell transfusion. He was in the [employer], he denies risk factors for HIV and hepatitis    History  Alcohol Use No    quit about 2004, heavy in the past    History  Smoking status  . Former Smoker -- 45 years  .   Types: Cigarettes  . Quit date: 07/14/2008  Smokeless tobacco  . Never Used     ROS:   Positives include: Chronic exertional dyspnea, fatigue and dizziness prior to the red blood cell transfusion  A complete ROS was otherwise negative.  Physical Exam:  Blood pressure 123/68, pulse 78, temperature 97 F (36.1 C), temperature source Oral, height 5' 6" (1.676 m), weight 146 lb 3.2 oz (66.316 kg).  HEENT: Oropharynx without visible mass, neck without mass Lungs: And inspiratory rhonchi at the right base. No respiratory distress Cardiac: Regular rate and rhythm Abdomen: No mass, no hepatosplenomegaly, nontender, no apparent ascites GU: Testes without mass  Vascular: No leg edema Lymph nodes: No cervical, supraclavicular, axillary, or inguinal nodes Neurologic: Alert and oriented, the motor exam appears intact throughout Skin: Multiple benign appearing moles over the trunk Musculoskeletal: No spine  tenderness   LAB:  CBC  Lab Results  Component Value Date   WBC 6.7 03/17/2012   HGB 7.2* 03/17/2012   HCT 23.1* 03/17/2012   MCV 74.5* 03/17/2012   PLT 331 03/17/2012     CMP      Component Value Date/Time   NA 134* 03/17/2012 1209   K 4.3 03/17/2012 1209   CL 101 03/17/2012 1209   CO2 25 03/17/2012 1209   GLUCOSE 104* 03/17/2012 1209   BUN 15 03/17/2012 1209   CREATININE 1.00 03/17/2012 1209   CALCIUM 8.1* 03/17/2012 1209   PROT 6.4 03/17/2012 1209   ALBUMIN 3.4* 03/17/2012 1209   AST 13 03/17/2012 1209   ALT 8 03/17/2012 1209   ALKPHOS 76 03/17/2012 1209   BILITOT 0.2* 03/17/2012 1209   GFRNONAA 72* 03/17/2012 1209   GFRAA 83* 03/17/2012 1209     Radiology: As per history of present illness    Assessment/Plan:   1. Adenocarcinoma of the stomach-adenocarcinoma found in the gastric remnant, status post a previous partial gastrectomy/Billroth II anastomosis. No evidence of metastatic disease by physical exam or CT of the abdomen/pelvis  2. Remote partial gastrectomy/Billroth II anastomosis secondary to ulcer disease  3. Iron deficiency anemia secondary to the gastric mass, status post a red cell transfusion may 24th 2013  4. COPD  5. History of colon polyps     Disposition:   Danny Page has been diagnosed with adenocarcinoma of the stomach. I discussed the diagnosis, prognosis, and treatment options with the patient and his wife. He understands that most patients with "resectable "gastric cancer are treated with surgery, chemotherapy, and radiation. The sequencing of these modalities varies based on the clinical stage at presentation.   Surgery would potentially require a complete gastrectomy in his case. He stated that he is not interested in surgery if he has to undergo a complete gastrectomy. He has been referred to Dr. Byerly to make this determination.  We will make a decision on primary chemotherapy/radiation, neoadjuvant chemotherapy/radiation, or upfront surgery  after he is evaluated by Dr. Byerly and discussed at the GI tumor conference.  I referred him to Dr. Jacobs for a staging endoscopic ultrasound. He will also be scheduled for a CT of the chest.  Danny Page will return for an office visit in 2 weeks. I will recommend he begin iron replacement therapy.   Lydie Stammen 03/24/2012, 6:25 PM     

## 2012-03-24 NOTE — Telephone Encounter (Signed)
lmonvm adviisng the pt of his nutrition appt on 03/31/2012

## 2012-03-27 ENCOUNTER — Telehealth: Payer: Self-pay

## 2012-03-27 ENCOUNTER — Telehealth: Payer: Self-pay | Admitting: *Deleted

## 2012-03-27 ENCOUNTER — Other Ambulatory Visit: Payer: Self-pay

## 2012-03-27 ENCOUNTER — Telehealth (INDEPENDENT_AMBULATORY_CARE_PROVIDER_SITE_OTHER): Payer: Self-pay

## 2012-03-27 DIAGNOSIS — C169 Malignant neoplasm of stomach, unspecified: Secondary | ICD-10-CM

## 2012-03-27 NOTE — Telephone Encounter (Signed)
  patty Okay, let's set him up with upper endoscopic ultrasound radial plus minus linear scope. 60 minutes. Diagnosis gastric cancer staging. This Thursday morning. Does not need propofol.   Juleen Starr set this up. thanks

## 2012-03-27 NOTE — Telephone Encounter (Signed)
Danny Page from the cancer center called and advised that Dr Truett Perna the pt needs to have EUS this week if possible surgeon appt on Friday of this week.

## 2012-03-27 NOTE — Telephone Encounter (Signed)
Phone call to patient per request of Dr.Sherrill.  Dr. Truett Perna would like patient to begin Ferrous Sulfate 325 mg three times a day by mouth if not taking Iron already.  Patient stated he was not taking Ferrous Sulfate at present time.  Patient verbalized comprehension to begin the Ferrous Sulfate 325 mg three times a day.

## 2012-03-27 NOTE — Telephone Encounter (Signed)
Pt has been instructed and meds reviewed Instructions mailed to the home All the pts questions were answered

## 2012-03-27 NOTE — Telephone Encounter (Signed)
Pt on schedule to see Dr. Donell Beers 03/31/12 at 8:30 am.  Unable to reach him.  Will try again.

## 2012-03-28 ENCOUNTER — Telehealth: Payer: Self-pay | Admitting: Oncology

## 2012-03-28 NOTE — Telephone Encounter (Signed)
called pt and provided appt for ct scan on 06/06 @ [facility] and nutri and md vists for 06/12.

## 2012-03-28 NOTE — Discharge Summary (Signed)
[facility] Discharge Summary  Name: Danny Page MRN: [id number] DOB: 21-Apr-1935 77 y.o. PCP:  Tillman Abide, MD, MD  Date of Admission: 03/17/2012 10:42 AM Date of Discharge: 03/17/2012 Attending Physician: Melvia Heaps, MD Primary Gastroenterologist:  Erick Blinks, MD  Discharge Diagnosis: 1. Iron deficiency anemia 2. Gastric cancer, newly diagnosed 3. COPD  Consultations:  None  Procedures Performed:  Ct Abdomen Pelvis W Contrast  03/17/2012  *RADIOLOGY REPORT*  Clinical Data: GI bleed.  Gastric mass at endoscopy.  CT ABDOMEN AND PELVIS WITH CONTRAST  Technique:  Multidetector CT imaging of the abdomen and pelvis was performed following the standard protocol during bolus administration of intravenous contrast.  Contrast: OMNIPAQUE IOHEXOL 300 MG/ML  SOLN  Comparison: Recent endoscopy.  Findings: There is a enhancing mass along the medial wall of the stomach beginning just below the GE junction and extending down to the gastroc jejunostomy anastomoses.  It measures approximately 4.7 x 4.5 x 2.2 cm.  I do not see any findings to suggest involvement of the small bowel or perigastric fat.  There are no enlarged gastrohepatic were celiac axis lymph nodes.  No findings for hepatic metastatic disease.  Diffuse fatty infiltration of the liver is noted.  The pancreas is normal.  The spleen is normal.  Small bilateral adrenal gland nodules are likely benign adenomas.  No mesenteric or retroperitoneal mass or adenopathy.  The aorta demonstrates moderate atherosclerotic calcifications but no dissection or aneurysm.  The gallbladder is normal.  No common bile duct dilatation.  The small bowel and colon demonstrate no significant findings.  No mass lesions or inflammatory changes.  Scattered colonic diverticuli.  The appendix is normal.  The bladder, prostate gland and seminal vesicles are normal.  No pelvic mass, adenopathy or free pelvic fluid collections.  No inguinal mass or hernia.   The bony structures are intact.  The lung bases are clear.  IMPRESSION:  1.  5 cm gastric mass without findings for locally invasive disease, adenopathy or metastasis. 2.  Surgical changes from a partial gastrectomy and Billroth II. 3.  Diffuse fatty infiltration of the liver. 4.  Small bilateral adrenal gland nodules are likely benign adenomas. 5.  Atherosclerotic calcifications involving the aorta and branch vessels but no aneurysm.  Original Report Authenticated By: P. Loralie Champagne, M.D.    GI Procedures: none. EGD and colonoscopy done day prior to admission  History/Physical Exam:  See Admission H&P  Admission HPI:  Danny Page is a 77 y.o. male seen by Dr. Rhea Belton late April for evaluation of anemia. evaluation of black stools. Stools were black, heme positive. He subsequently underwent EGD and colonoscopy. He had thick black stool in colon. On EGD he had a firm mass extending from anastomosis into cardia. His anemia has progressed. Hemoglobin 7.8 yesterday, down from 10.3 one month ago. He is being admitted for observation to get a blood transfusion. Patient denies nausea, abdominal pain, or significant weight loss   Hospital Course by problem list: 1. Iron deficiency anemia secondary to #2.Marland Kitchen Hemoglobin 7.2 on admission, down from 10.3 in April. MCV 74. No availability in Short Stay, patient therefore admitted to medical floor blood transfusion. Patient was uneventfully transfused 2 units of PRBCs. He was discharged home on the evening of admission.   2. Gastric adenocarcinoma, newly diagnosed on EGD with biopsy 03/16/12. Dr.Pyrtle, patient's primary GI physician, is arranging for outpatient evaluations with surgery and oncology. CTscan of abdomen and pelvis this admission done to look for metastatic disease.  There was no evidence for locally invasive disease, adenopathy or metastasis.    3. COPD, no active issues this admission   Discharge Vitals:  BP 132/60  Pulse 88  Temp(Src) 98.6  F (37 C) (Oral)  Resp 18  Ht 5\' 6"  (1.676 m)  Wt 146 lb 6.2 oz (66.4 kg)  BMI 23.63 kg/m2  SpO2 96%  Discharge Labs: No results found for this or any previous visit (from the past 24 hour(s)).  Disposition and follow-up:   Danny Page was discharged from [facility] in stable condition.    Follow-up Appointments: Discharge Orders    Future Appointments: Provider: Department: Dept Phone: Center:   03/30/2012 2:30 PM [facility] [phone] [place]   03/31/2012 8:30 AM Almond Lint, MD [facility] [place] [phone] None   04/05/2012 11:15 AM Anabel Bene, RD [facility] [phone] None   04/05/2012 12:00 PM Ladene Artist, MD [facility] [phone] None   04/17/2012 11:00 AM Beverley Fiedler, MD [facility] Office [phone] [facility]      Discharge Medications: Medication List  As of 03/28/2012  3:28 PM   ASK your doctor about these medications         SPIRIVA HANDIHALER 18 MCG inhalation capsule   Generic drug: tiotropium   INHALE ONE DOSE EVERY DAY            Signed: Willette Cluster 03/28/2012, 3:28 PM

## 2012-03-28 NOTE — Telephone Encounter (Signed)
called pt and informed him that his appt on 06/07 was moved to 06/10

## 2012-03-30 ENCOUNTER — Ambulatory Visit (HOSPITAL_COMMUNITY)
Admission: RE | Admit: 2012-03-30 | Discharge: 2012-03-30 | Disposition: A | Discharge status: Home / Self Care | Payer: Medicare Other | Source: Ambulatory Visit | Attending: Oncology | Admitting: Oncology

## 2012-03-30 ENCOUNTER — Encounter (HOSPITAL_COMMUNITY): Payer: Self-pay | Admitting: *Deleted

## 2012-03-30 ENCOUNTER — Ambulatory Visit (HOSPITAL_COMMUNITY)
Admission: RE | Admit: 2012-03-30 | Discharge: 2012-03-30 | Disposition: A | Discharge status: Home / Self Care | Payer: Medicare Other | Source: Ambulatory Visit | Attending: Gastroenterology | Admitting: Gastroenterology

## 2012-03-30 ENCOUNTER — Encounter (HOSPITAL_COMMUNITY)
Admission: RE | Disposition: A | Discharge status: Home / Self Care | Payer: Self-pay | Source: Ambulatory Visit | Attending: Gastroenterology

## 2012-03-30 DIAGNOSIS — J4489 Other specified chronic obstructive pulmonary disease: Secondary | ICD-10-CM | POA: Insufficient documentation

## 2012-03-30 DIAGNOSIS — Z860100 Personal history of colon polyps, unspecified: Secondary | ICD-10-CM | POA: Insufficient documentation

## 2012-03-30 DIAGNOSIS — J449 Chronic obstructive pulmonary disease, unspecified: Secondary | ICD-10-CM | POA: Insufficient documentation

## 2012-03-30 DIAGNOSIS — C169 Malignant neoplasm of stomach, unspecified: Secondary | ICD-10-CM

## 2012-03-30 DIAGNOSIS — Z980 Intestinal bypass and anastomosis status: Secondary | ICD-10-CM | POA: Insufficient documentation

## 2012-03-30 DIAGNOSIS — D509 Iron deficiency anemia, unspecified: Secondary | ICD-10-CM | POA: Insufficient documentation

## 2012-03-30 DIAGNOSIS — Z8711 Personal history of peptic ulcer disease: Secondary | ICD-10-CM | POA: Insufficient documentation

## 2012-03-30 HISTORY — PX: EUS: SHX5427

## 2012-03-30 SURGERY — UPPER ENDOSCOPIC ULTRASOUND (EUS) LINEAR
Anesthesia: Moderate Sedation

## 2012-03-30 MED ORDER — SODIUM CHLORIDE 0.9 % IV SOLN
INTRAVENOUS | Status: DC
  Administered 2012-03-30: 500 mL via INTRAVENOUS

## 2012-03-30 MED ORDER — MIDAZOLAM HCL 10 MG/2ML IJ SOLN
INTRAMUSCULAR | Status: AC
  Filled 2012-03-30: qty 4

## 2012-03-30 MED ORDER — MIDAZOLAM HCL 10 MG/2ML IJ SOLN
INTRAMUSCULAR | Status: DC | PRN
  Administered 2012-03-30 (×2): 1 mg via INTRAVENOUS
  Administered 2012-03-30: 2 mg via INTRAVENOUS
  Administered 2012-03-30: 1 mg via INTRAVENOUS
  Administered 2012-03-30: 2 mg via INTRAVENOUS

## 2012-03-30 MED ORDER — DIPHENHYDRAMINE HCL 50 MG/ML IJ SOLN
INTRAMUSCULAR | Status: AC
  Filled 2012-03-30: qty 1

## 2012-03-30 MED ORDER — FENTANYL CITRATE 0.05 MG/ML IJ SOLN
INTRAMUSCULAR | Status: DC | PRN
  Administered 2012-03-30 (×3): 25 ug via INTRAVENOUS

## 2012-03-30 MED ORDER — FENTANYL CITRATE 0.05 MG/ML IJ SOLN
INTRAMUSCULAR | Status: AC
  Filled 2012-03-30: qty 4

## 2012-03-30 NOTE — Discharge Instructions (Signed)

## 2012-03-30 NOTE — Op Note (Signed)
[facility] [address] [place], [place]  [address]  ENDOSCOPIC ULTRASOUND PROCEDURE REPORT  PATIENT:  Danny Page, Danny Page  MR#:  [id number] BIRTHDATE:  1935-04-30  GENDER:  male ENDOSCOPIST:  Rachael Fee, MD REFERRED BY:  Lavada Mesi. Truett Perna, M.D. PROCEDURE DATE:  03/30/2012 PROCEDURE:  Upper EUS ASA CLASS:  Class III INDICATIONS:  recently diagnosed gastric adenocarcinoma, remote Bilroth I for ulcer disease ([place] 1950s) MEDICATIONS:   Fentanyl 75 mcg IV, These medications were titrated to patient response per physician's verbal order, Versed 7 mg IV  DESCRIPTION OF PROCEDURE:   After the risks benefits and alternatives of the procedure were  explained, informed consent was obtained. The patient was then placed in the left, lateral, decubitus postion and IV sedation was administered. Throughout the procedure, the patient's blood pressure, pulse and oxygen saturations were monitored continuously.  Under direct visualization, the 110036 endoscope was introduced through the mouth and advanced to the proximal jejunum.  Water was used as necessary to provide an acoustic interface.  Upon completion of the imaging, water was removed and the patient was sent to the recovery room in satisfactory condition. <<PROCEDUREIMAGES>>  Endoscopic findings: 1. Normal esophagus 2. Malignant mass in remnant stomach along posterior wall. The mass is approximately 4cm endoscopically, non-circumferential, non-obstrucing, extends from just below the GE junction to within 1-2cm of Bilroth I anastomosis.  EUS findings: 1. The mass above correlated with a 4.2cm length, 9mm thick, hypochoic mass that clearly passes into and through the muscularis propria layer of the gastric wall (T3). 2.  No perigastric adenopathy (N0)  Impression: uT3N0 (stage IIa) 4.2cm long, 9mm thick gastric adenocarcinoma in remnant stomach following remote Bilroth I surgery for ulcer disease ([place] in 48s).   He understands that best chance for cure is a completion gastrectomy  (perhaps after neoadjuvant chemo/XRT?) but is not sure if he wants to undergo the surgery due to likely morbidities.  He is meeting with Dr. Donell Beers tomorrow to discuss further.  ______________________________ Rachael Fee, MD  n. eSIGNED:   Rachael Fee at 03/30/2012 10:08 AM  Vella Kohler, [phone]

## 2012-03-30 NOTE — H&P (View-Only) (Signed)
[facility] New Patient Consult   Referring MD: Bing Duffey 77 y.o.  06/29/1935    Reason for Referral: Gastric cancer     HPI: He saw Dr. Alphonsus Sias for evaluation of malaise and "dizziness ". A CBC on 01/26/2012 was remarkable for a hemoglobin of 10.3 with an MCV of 73.6.  He was referred to Dr. Rhea Belton and underwent an upper endoscopy on may 23rd 2013. The esophagus and gastroesophageal junction appeared normal. He is status post a partial gastrectomy with a Billroth II anatomy. The gastrojejunal anastomosis was intact. A few shallow erosions were noted near the anastomosis. A firm mass was found in the body of the stomach extending from the anastomosis into the gastric cardia. Multiple biopsies were obtained. There was a small hiatal hernia. A colonoscopy on the same day was limited by poor preparation.  The biopsy from the stomach mass was positive for adenocarcinoma.  A CT of the abdomen and pelvis on 03/17/2012 revealed an enhancing mass at the medial wall of the stomach extending to the gastrojejunal anastomosis. No findings to suggest involvement of the small bowel or perigastric fat. No enlarged gastrohepatic or celiac nodes. No evidence of hepatic metastatic disease. The pancreas and spleen appeared normal. Small bilateral adrenal nodules were felt to be adenomatous. No mesenteric or retroperitoneal mass or adenopathy. The small bowel and colon were unremarkable. The lung bases were clear.  He was referred to Dr. Luisa Hart. Dr. Luisa Hart has referred the patient to Dr. Donell Beers for surgical oncology care.  The hemoglobin returned at 7.2 on may 24th 2013. He reports being transfused with 2 units of packed red blood cells. The fatigue and "dizziness "resolved following the transfusion.  Past Medical History  Diagnosis Date  . COPD (chronic obstructive pulmonary disease)       . Hemorrhoids   . Anemia-iron deficiency anemia   April 2013   . Stomach  cancer  may 23rd 2013    .    History of colon polyps  .    Zoster rash over the left face and neck 2-3 years ago Past Surgical History  Procedure Date  . Stomach surgery     for ulcers, age 84   .    Right knee surgery following trauma  .    Hemorrhoid surgery 10-15 years ago  Family History  Problem Relation Age of Onset  . Heart disease Brother     .   Breast cancer                                                                             Mother  Current outpatient prescriptions:SPIRIVA HANDIHALER 18 MCG inhalation capsule, INHALE ONE DOSE EVERY DAY, Disp: 30 each, Rfl: 11  Allergies: No Known Allergies  Social History: He lives in [place]. He previously worked as an Art gallery manager. No transfusion history prior to the May 2013 red cell transfusion. He was in the [employer], he denies risk factors for HIV and hepatitis    History  Alcohol Use No    quit about 2004, heavy in the past    History  Smoking status  . Former Smoker -- 45 years  .  Types: Cigarettes  . Quit date: 07/14/2008  Smokeless tobacco  . Never Used     ROS:   Positives include: Chronic exertional dyspnea, fatigue and dizziness prior to the red blood cell transfusion  A complete ROS was otherwise negative.  Physical Exam:  Blood pressure 123/68, pulse 78, temperature 97 F (36.1 C), temperature source Oral, height 5\' 6"  (1.676 m), weight 146 lb 3.2 oz (66.316 kg).  HEENT: Oropharynx without visible mass, neck without mass Lungs: And inspiratory rhonchi at the right base. No respiratory distress Cardiac: Regular rate and rhythm Abdomen: No mass, no hepatosplenomegaly, nontender, no apparent ascites GU: Testes without mass  Vascular: No leg edema Lymph nodes: No cervical, supraclavicular, axillary, or inguinal nodes Neurologic: Alert and oriented, the motor exam appears intact throughout Skin: Multiple benign appearing moles over the trunk Musculoskeletal: No spine  tenderness   LAB:  CBC  Lab Results  Component Value Date   WBC 6.7 03/17/2012   HGB 7.2* 03/17/2012   HCT 23.1* 03/17/2012   MCV 74.5* 03/17/2012   PLT 331 03/17/2012     CMP      Component Value Date/Time   NA 134* 03/17/2012 1209   K 4.3 03/17/2012 1209   CL 101 03/17/2012 1209   CO2 25 03/17/2012 1209   GLUCOSE 104* 03/17/2012 1209   BUN 15 03/17/2012 1209   CREATININE 1.00 03/17/2012 1209   CALCIUM 8.1* 03/17/2012 1209   PROT 6.4 03/17/2012 1209   ALBUMIN 3.4* 03/17/2012 1209   AST 13 03/17/2012 1209   ALT 8 03/17/2012 1209   ALKPHOS 76 03/17/2012 1209   BILITOT 0.2* 03/17/2012 1209   GFRNONAA 72* 03/17/2012 1209   GFRAA 83* 03/17/2012 1209     Radiology: As per history of present illness    Assessment/Plan:   1. Adenocarcinoma of the stomach-adenocarcinoma found in the gastric remnant, status post a previous partial gastrectomy/Billroth II anastomosis. No evidence of metastatic disease by physical exam or CT of the abdomen/pelvis  2. Remote partial gastrectomy/Billroth II anastomosis secondary to ulcer disease  3. Iron deficiency anemia secondary to the gastric mass, status post a red cell transfusion may 24th 2013  4. COPD  5. History of colon polyps     Disposition:   Mr. Thayne has been diagnosed with adenocarcinoma of the stomach. I discussed the diagnosis, prognosis, and treatment options with the patient and his wife. He understands that most patients with "resectable "gastric cancer are treated with surgery, chemotherapy, and radiation. The sequencing of these modalities varies based on the clinical stage at presentation.   Surgery would potentially require a complete gastrectomy in his case. He stated that he is not interested in surgery if he has to undergo a complete gastrectomy. He has been referred to Dr. Donell Beers to make this determination.  We will make a decision on primary chemotherapy/radiation, neoadjuvant chemotherapy/radiation, or upfront surgery  after he is evaluated by Dr. Donell Beers and discussed at the GI tumor conference.  I referred him to Dr. Christella Hartigan for a staging endoscopic ultrasound. He will also be scheduled for a CT of the chest.  Mr.Louischarles will return for an office visit in 2 weeks. I will recommend he begin iron replacement therapy.   Theophil Thivierge 03/24/2012, 6:25 PM

## 2012-03-30 NOTE — Interval H&P Note (Signed)
History and Physical Interval Note:  03/30/2012 8:24 AM  Danny Page  has presented today for surgery, with the diagnosis of Gastric cancer [151.9]  The various methods of treatment have been discussed with the patient and family. After consideration of risks, benefits and other options for treatment, the patient has consented to  Procedure(s) (LRB): UPPER ENDOSCOPIC ULTRASOUND (EUS) LINEAR (N/A) as a surgical intervention .  The patients' history has been reviewed, patient examined, no change in status, stable for surgery.  I have reviewed the patients' chart and labs.  Questions were answered to the patient's satisfaction.     Rob Bunting

## 2012-03-31 ENCOUNTER — Encounter (HOSPITAL_COMMUNITY): Payer: Self-pay | Admitting: Gastroenterology

## 2012-03-31 ENCOUNTER — Ambulatory Visit (INDEPENDENT_AMBULATORY_CARE_PROVIDER_SITE_OTHER): Payer: Medicare Other | Admitting: General Surgery

## 2012-03-31 ENCOUNTER — Encounter: Payer: Medicare Other | Admitting: Nutrition

## 2012-03-31 VITALS — BP 132/68 | HR 103 | Temp 98.3°F | Ht 66.0 in | Wt 146.2 lb

## 2012-03-31 DIAGNOSIS — C169 Malignant neoplasm of stomach, unspecified: Secondary | ICD-10-CM

## 2012-03-31 NOTE — Progress Notes (Signed)
Chief Complaint  Patient presents with  . Pre-op Exam    eval stomach cancer    HISTORY: Patient is a 77 year old male who presents with a new diagnosis of gastric cancer. He presented to his primary care physician, Dr. Alphonsus Sias, with anemia. He was sent to Dr. Rhea Belton for a colonoscopy and EGD. He was unable to complete all the colonoscopy prep. He started having significant vomiting and diarrhea. However, the EGD was positive for a large gastric mass on once her curve. This was biopsied as adenocarcinoma. Of note, he had a antrectomy with Billroth II anastomosis in the 1950s in [place] for ulcer disease.  Overall he feels in good health. He has shortness of breath with significant exertion but is able to do his daily activities and moderate exercise without significant difficulty. He takes one inhaler for his COPD. He gets around reasonably well.  He has not noticed any early satiety, weight loss, nausea, vomiting, or blood in his stool. He was referred to me for consideration of completion gastrectomy.  Past Medical History  Diagnosis Date  . COPD (chronic obstructive pulmonary disease)   . Post herpetic neuralgia   . Hemorrhoids   . Anemia   . Stomach cancer     Past Surgical History  Procedure Date  . Stomach surgery     for ulcers, age 77  . Eus 03/30/2012    Procedure: UPPER ENDOSCOPIC ULTRASOUND (EUS) LINEAR;  Surgeon: Rachael Fee, MD;  Location: [facility];  Service: Endoscopy;  Laterality: N/A;    Current Outpatient Prescriptions  Medication Sig Dispense Refill  . SPIRIVA HANDIHALER 18 MCG inhalation capsule INHALE ONE DOSE EVERY DAY  30 each  11   No current facility-administered medications for this visit.   Facility-Administered Medications Ordered in Other Visits  Medication Dose Route Frequency Provider Last Rate Last Dose  . DISCONTD: 0.9 %  sodium chloride infusion   Intravenous Continuous Rachael Fee, MD 20 mL/hr at 03/30/12 0838 500 mL at 03/30/12 0838    . DISCONTD: fentaNYL (SUBLIMAZE) injection    PRN Rachael Fee, MD   25 mcg at 03/30/12 [phone]  . DISCONTD: midazolam (VERSED) injection    PRN Rachael Fee, MD   1 mg at 03/30/12 0948     No Known Allergies   Family History  Problem Relation Age of Onset  . Heart disease Brother      History   Social History  . Marital Status: Married    Spouse Name: N/A    Number of Children: N/A  . Years of Education: N/A   Occupational History  . retired, Art gallery manager    Social History Main Topics  . Smoking status: Former Smoker -- 45 years    Types: Cigarettes    Quit date: 07/14/2008  . Smokeless tobacco: Never Used  . Alcohol Use: No     quit about 2004, heavy in the past  . Drug Use: No  . Sexually Active: None   Other Topics Concern  . None   Social History Narrative   No living willRequests wife as health care POAWould accept resuscitation attemptsWould probably accept feeding tube     REVIEW OF SYSTEMS - PERTINENT POSITIVES ONLY: 12 point review of systems negative other than HPI and PMH except for HPI  EXAM: Filed Vitals:   03/31/12 0842  BP: 132/68  Pulse: 103  Temp: 98.3 F (36.8 C)    Gen:  No acute distress.  Well nourished and  well groomed.  Thin.   Neurological: Alert and oriented to person, place, and time. Coordination normal.  Head: Normocephalic and atraumatic.  Eyes: Conjunctivae are normal. Pupils are equal, round, and reactive to light. No scleral icterus.  Neck: Normal range of motion. Neck supple. No tracheal deviation or thyromegaly present.  Cardiovascular: Normal rate, regular rhythm, normal heart sounds and intact distal pulses.  Exam reveals no gallop and no friction rub.  No murmur heard. Respiratory: Effort normal.  No respiratory distress. No chest wall tenderness. Breath sounds normal.  No wheezes, rales or rhonchi.  GI: Soft. Bowel sounds are normal. The abdomen is soft and nontender.  There is no rebound and no guarding.   Musculoskeletal: Normal range of motion. Extremities are nontender.  Lymphadenopathy: No cervical, preauricular, postauricular or axillary adenopathy is present Skin: Skin is warm and dry. No rash noted. No diaphoresis. No erythema. Pale. No clubbing, cyanosis, or edema.   Psychiatric: Normal mood and affect. Behavior is normal. Judgment and thought content normal.    LABORATORY RESULTS: LFTs normal, CBC with HCT 23.1 Pathology   Stomach, biopsy, mass - POSITIVE FOR ADENOCARCINOMA.   RADIOLOGY RESULTS: CT chest/abd/pelvis IMPRESSION:  1. No evidence of metastatic disease in the chest.  2. A few scattered tiny pulmonary nodules are nonspecific.  Continued attention on follow-up exams is warranted.  3. Clustered peribronchovascular nodularity in the lingula is  likely infectious or post infectious etiology.  4. Bilateral adrenal adenomas.     ASSESSMENT AND PLAN: Gastric cancer Patient does not desire to proceed with surgery for curative intent. For him, surgical treatment would require a total gastrectomy considering that he has already had an antrectomy with BII for ulcer disease. He does not wish to have surgery, recover from surgery, or deal with issues of a feeding tube at this point.  We discussed what surgery would entail. We discussed complications of surgery. We discussed the progression of gastric cancer without surgery. He expresses understanding. I will follow him up on an as-needed basis. Dr. Truett Perna I. me know if he needs a Port-A-Cath. I did briefly discuss Port-A-Cath placement in the event that he may need one.   30 minutes were spent in counseling with this patient.     Maudry Diego MD Surgical Oncology, General and Endocrine Surgery [facility]      Visit Diagnoses: 1. Gastric cancer     Primary Care Physician: Tillman Abide, MD, MD

## 2012-03-31 NOTE — Patient Instructions (Signed)
Follow up with Dr. Truett Perna as scheduled to discuss chemotherapy.  Follow up with me as needed.  Dr. Truett Perna will let us know if we need to put in port a cath placement for chemo.

## 2012-03-31 NOTE — Assessment & Plan Note (Signed)
Patient does not desire to proceed with surgery for curative intent. For him, surgical treatment would require a total gastrectomy considering that he has already had an antrectomy with BII for ulcer disease. He does not wish to have surgery, recover from surgery, or deal with issues of a feeding tube at this point.  We discussed what surgery would entail. We discussed complications of surgery. We discussed the progression of gastric cancer without surgery. He expresses understanding. I will follow him up on an as-needed basis. Dr. Truett Perna I. me know if he needs a Port-A-Cath. I did briefly discuss Port-A-Cath placement in the event that he may need one.

## 2012-04-03 ENCOUNTER — Encounter: Payer: Medicare Other | Admitting: Nutrition

## 2012-04-05 ENCOUNTER — Ambulatory Visit: Payer: Medicare Other | Admitting: Nutrition

## 2012-04-05 ENCOUNTER — Telehealth: Payer: Self-pay | Admitting: Oncology

## 2012-04-05 ENCOUNTER — Ambulatory Visit (HOSPITAL_BASED_OUTPATIENT_CLINIC_OR_DEPARTMENT_OTHER): Payer: Medicare Other | Admitting: Oncology

## 2012-04-05 VITALS — BP 124/73 | HR 78 | Temp 97.8°F | Ht 66.0 in | Wt 145.1 lb

## 2012-04-05 DIAGNOSIS — D509 Iron deficiency anemia, unspecified: Secondary | ICD-10-CM

## 2012-04-05 DIAGNOSIS — C169 Malignant neoplasm of stomach, unspecified: Secondary | ICD-10-CM

## 2012-04-05 DIAGNOSIS — J449 Chronic obstructive pulmonary disease, unspecified: Secondary | ICD-10-CM

## 2012-04-05 NOTE — Telephone Encounter (Signed)
Gave pt appt calendar for June , and July 2013 lab,ML and chemo class

## 2012-04-05 NOTE — Progress Notes (Signed)
   [facility]    OFFICE PROGRESS NOTE   INTERVAL HISTORY:   He returns as scheduled. He is not taking iron.  Mr. Wiechman saw Dr. Donell Beers. He has decided against surgery. His case was presented at the GI tumor conference last week. The surgeons felt a total gastrectomy would be required for curative surgery.  Dr. Christella Hartigan performed an endoscopic ultrasound on 03/30/2012. A mass was noted along the posterior wall of the remnant stomach measuring 4 cm. The mass was noted to pass into and through the muscular propria layer of the gastric wall. No perigastric adenopathy.  Objective:  Vital signs in last 24 hours:  Blood pressure 124/73, pulse 78, temperature 97.8 F (36.6 C), temperature source Oral, height 5\' 6"  (1.676 m), weight 145 lb 1.6 oz (65.817 kg).   Physical exam: Not performed today  Lab Results:  Lab Results  Component Value Date   WBC 6.7 03/17/2012   HGB 7.2* 03/17/2012   HCT 23.1* 03/17/2012   MCV 74.5* 03/17/2012   PLT 331 03/17/2012      Medications: I have reviewed the patient's current medications.  Assessment/Plan: 1. Adenocarcinoma of the stomach-adenocarcinoma found in the gastric remnant, status post a previous partial gastrectomy/Billroth II anastomosis. No evidence of metastatic disease by physical exam or CT of the abdomen/pelvis . Staging EUS on 03/30/2012 confirmed a uT3,uN0 tumor  2. Remote partial gastrectomy/Billroth II anastomosis secondary to ulcer disease   3. Iron deficiency anemia secondary to the gastric mass, status post a red cell transfusion may 24th 2013 -currently not taking iron  4. COPD   5. History of colon polyps  Disposition:  He has been diagnosed with gastric cancer. His case was presented at the GI tumor conference and he has been evaluated by Dr. Rowan Blase. Surgery for curative intent would require a complete gastrectomy. Mr. Standish has decided against surgery.  I discussed treatment options with the patient. I  recommend concurrent chemotherapy and radiation. We will make a referral to Dr. Roselind Messier. I recommend Xeloda chemotherapy to be given concurrent with radiation. He will be restaged at the completion of chemotherapy/radiation and we will then decide on further systemic therapy.  I reviewed the potential toxicities associated with Xeloda including a chance for nausea, mucositis, diarrhea, and hematologic toxicity. We discussed the skin hyperpigmentation, rash, and hand/foot syndrome associated with Xeloda. He will attend a chemotherapy teaching class.  The preliminary plan is to begin concurrent therapy on 04/17/2012 if Dr. Roselind Messier is in agreement.  He will begin iron therapy. We will check a CBC when he is here on June 17.  He will return for an office visit and CBC on 05/01/2012.   Thornton Papas, MD  04/05/2012  1:11 PM

## 2012-04-05 NOTE — Progress Notes (Signed)
Met with patient and introduced navigator role.  Resource numbers and contact information given to patient.  Patient declines assistance from SW at this time. Patient met with dietician today.   Encouraged patient to call for questions or assist.  Referral made to Radiation Oncology per MD request.  Will continue to follow.

## 2012-04-05 NOTE — Assessment & Plan Note (Signed)
Danny Page is a 77 year old male patient of Dr. Truett Perna diagnosed with gastric cancer.  MEDICAL HISTORY INCLUDES:  COPD, anemia, antrectomy with Billroth II anastomosis in the 1950s, alcohol and tobacco usage.  MEDICATIONS INCLUDE:  Spiriva.  LABS:  Sodium 134, glucose 104, albumin 3.4 on 05/21.  HEIGHT:  66 inches. WEIGHT:  146.2 pounds June 7th. USUAL BODY WEIGHT:  150 pounds. BMI:  23.61.  The patient reports that he generally eats 2 meals a day.  1st thing in the morning he drinks a pot of good coffee with cream and sugar.  His 1st meal of the day at lunch.  He eats dinner.  He does not snack between meals.  He reports he has eaten this way for the past 30 years. His weight is stable.  He is a moderate eater.  The patient has declined total gastrectomy with feeding tube.  He is here today prior to his appointment with his physician to determine what his plan of care will be.  NUTRITION DIAGNOSIS:  Food and nutrition related knowledge deficit related to new diagnosis of gastric cancer as evidenced by no prior need for nutrition related information.  INTERVENTION:  I have educated the patient on the importance of adequate calories and protein to promote weight maintenance throughout treatment. We have briefly discussed iron rich foods that he can add to his diet since he has a history of anemia.  I provided him with education on foods that are high in protein.  I have encouraged him to add a small snack mid morning to begin the process of trying to increase his meals and snacks to provide adequate nutrition.  I have answered their questions.  MONITORING/EVALUATION (GOALS):  The patient will tolerate oral diet to promote weight maintenance.  NEXT VISIT:  Patient will call with questions or concerns once treatment plan is decided.    ______________________________ Zenovia Jarred, RD, LDN Clinical Nutrition Specialist BN/MEDQ  D:  04/05/2012  T:  04/05/2012  Job:  [id number]

## 2012-04-10 ENCOUNTER — Encounter: Payer: Self-pay | Admitting: Radiation Oncology

## 2012-04-10 ENCOUNTER — Ambulatory Visit
Admission: RE | Admit: 2012-04-10 | Discharge: 2012-04-10 | Disposition: A | Discharge status: Home / Self Care | Payer: Medicare Other | Source: Ambulatory Visit | Attending: Radiation Oncology | Admitting: Radiation Oncology

## 2012-04-10 VITALS — BP 118/67 | HR 80 | Temp 97.1°F | Resp 18 | Ht 67.0 in | Wt 145.6 lb

## 2012-04-10 DIAGNOSIS — C169 Malignant neoplasm of stomach, unspecified: Secondary | ICD-10-CM

## 2012-04-10 DIAGNOSIS — Z79899 Other long term (current) drug therapy: Secondary | ICD-10-CM | POA: Insufficient documentation

## 2012-04-10 DIAGNOSIS — Z510 Encounter for antineoplastic radiation therapy: Secondary | ICD-10-CM | POA: Insufficient documentation

## 2012-04-10 DIAGNOSIS — J449 Chronic obstructive pulmonary disease, unspecified: Secondary | ICD-10-CM | POA: Insufficient documentation

## 2012-04-10 DIAGNOSIS — J4489 Other specified chronic obstructive pulmonary disease: Secondary | ICD-10-CM | POA: Insufficient documentation

## 2012-04-10 NOTE — Progress Notes (Signed)
Patient presents to the clinic today accompanied by his family member for consideration of radiation therapy in the treatment of gastric cancer. Patient is alert and oriented to person, place, and time. No distress noted. Steady gait noted. Pleasant affect noted. Patient denies pain at this time. Patient denies nausea, vomiting, headache, dizziness or diarrhea. Patient reports a formed normal daily bowel movement without blood. Patient reports he continues to eat very little saying he "has never had much of an appetite or been a big eater." patient reports a decreased energy level. Patient reports sleeping without difficulty. Reported all findings to Dr. Roselind Messier.

## 2012-04-10 NOTE — Progress Notes (Signed)
77 year old male.  Referred to Dr. Roselind Messier by Dr. Truett Perna for consideration of radiation therapy to treat gastric cancer. Dr. Truett Perna recommends concurrent Xeloda chemotherapy and radiation. Dr. Truett Perna plans to begin therapy 04/17/2012. Patient was seen by Dr. Donell Beers who feels the only curative surgery would require complete gastrectomy but, patient decided again surgery.   NKDA No indication of a pacemaker No hx of radiation therapy.

## 2012-04-10 NOTE — Progress Notes (Signed)
Complete PATIENT MEASURE OF DISTRESS worksheet without concerns submitted to social work. Also, complete NUTRITION RISK SCREEN worksheet without concerns submitted to Zenovia Jarred, RD. Patient reports a five pound weight loss in the past four months.

## 2012-04-10 NOTE — Progress Notes (Signed)
Radiation Oncology         [phone] ________________________________  Initial outpatient Consultation  Name: Danny Page MRN: [id number]  Date: 04/10/2012  DOB: 03-11-1935  WU:[id number] Alphonsus Sias, MD  Ladene Artist, MD   REFERRING PHYSICIAN: Ladene Artist, MD  DIAGNOSIS: The encounter diagnosis was Gastric cancer.  HISTORY OF PRESENT ILLNESS::Danny Page is a 77 y.o. male who is seen out of the courtesy of Dr. Truett Perna for an opinion concerning radiation therapy as part of management of patient's locally advanced gastric cancer. The patient was found to be anemic. This prompted the endoscopy where mass was noted in the residual stomach area. CT scan report as below showed approximately 5 cm mass without any local regional extension. Biopsy of this area revealed adenocarcinoma. Ultrasound revealed a UT3N0 lesion.  Patient was seen surgical oncology and he elected not to proceed with the surgery as this would require a total gastrectomy in light of his prior surgery.   PREVIOUS RADIATION THERAPY: No  PAST MEDICAL HISTORY:  has a past medical history of COPD (chronic obstructive pulmonary disease); Post herpetic neuralgia; Hemorrhoids; Anemia; and Stomach cancer.    PAST SURGICAL HISTORY: Past Surgical History  Procedure Date  . Stomach surgery     for ulcers, age 32  . Eus 03/30/2012    Procedure: UPPER ENDOSCOPIC ULTRASOUND (EUS) LINEAR;  Surgeon: Rachael Fee, MD;  Location: [facility];  Service: Endoscopy;  Laterality: N/A;    FAMILY HISTORY: family history includes Cancer (age of onset:73) in his mother and Heart disease in his brother.  SOCIAL HISTORY:  reports that he quit smoking about 8 years ago. His smoking use included Cigarettes. He quit after 45 years of use. He has never used smokeless tobacco. He reports that he does not drink alcohol or use illicit drugs.  ALLERGIES: Review of patient's allergies indicates no known allergies.  MEDICATIONS:  Current  Outpatient Prescriptions  Medication Sig Dispense Refill  . SPIRIVA HANDIHALER 18 MCG inhalation capsule INHALE ONE DOSE EVERY DAY  30 each  11    REVIEW OF SYSTEMS:  A 15 point review of systems is documented in the electronic medical record. This was obtained by the nursing staff. However, I reviewed this with the patient to discuss relevant findings and make appropriate changes.  He has noticed some increased flatulence. He denies any belching. Patient denies any nausea or hematemesis. Patient has limited intake but this has been present for some time in light of his prior stomach surgery.   PHYSICAL EXAM:  height is 5\' 7"  (1.702 m) and weight is 145 lb 9.6 oz (66.044 kg). His oral temperature is 97.1 F (36.2 C). His blood pressure is 118/67 and his pulse is 80. His respiration is 18.  the pupils are equal round and reactive to light. Extraocular eye movements are intact. The tongue is midline. There is no secondary infection noted more cavity or posterior pharynx. The neck is free of adenopathy.  There is no supraclavicular or axillary adenopathy. The lungs clear to auscultation. The heart has regular rhythm and rate. The abdomen is soft and nontender with normal bowel sounds. There is no palpable epigastric mass. Patient has a scar along the upper abdominal area from his prior gastric surgery. There is no rebound or guarding present area there is no obvious hepatosplenomegaly. Examination of extremities reveals the some mild clubbing. Peripheral pulses are good. On neurological examination motor strength is 5 out of 5 in the proximal and distal  muscle groups in the upper lower extremities.   LABORATORY DATA:  Lab Results  Component Value Date   WBC 6.7 03/17/2012   HGB 7.2* 03/17/2012   HCT 23.1* 03/17/2012   MCV 74.5* 03/17/2012   PLT 331 03/17/2012   Lab Results  Component Value Date   NA 134* 03/17/2012   K 4.3 03/17/2012   CL 101 03/17/2012   CO2 25 03/17/2012   Lab Results  Component  Value Date   ALT 8 03/17/2012   AST 13 03/17/2012   ALKPHOS 76 03/17/2012   BILITOT 0.2* 03/17/2012     RADIOGRAPHY: Ct Chest Wo Contrast  03/30/2012  **ADDENDUM** CREATED: 03/30/2012 11:40:06  Gastric mass is better appreciated on 03/17/2012.  **END ADDENDUM** SIGNED BY: Reyes Ivan, M.D.   03/30/2012  *RADIOLOGY REPORT*  Clinical Data: New diagnosis of gastric cancer.  Cough.  CT CHEST WITHOUT CONTRAST  Technique:  Multidetector CT imaging of the chest was performed following the standard protocol without IV contrast.  Comparison: CT abdomen pelvis 03/17/2012.  Findings: No pathologically enlarged mediastinal or axillary lymph nodes.  Hilar regions are difficult to definitively evaluate without IV contrast.  Atherosclerotic calcification of the arterial vasculature, including coronary arteries.  Heart size normal.  No pericardial effusion.  Emphysema.  A 6 mm subpleural left lower lobe nodule has a tag to the adjacent pleura.  A subpleural lymph node is favored. A few additional scattered small pulmonary nodules are seen bilaterally. There is clustered peribronchovascular nodularity in the lingula (example image 42).  Scattered scar and/or atelectasis bilaterally. No pleural fluid.  Airway is unremarkable.  Incidental imaging of the upper abdomen shows a 1.3 cm low density nodule in the right adrenal glands, measuring -15 Hounsfield units. Left adrenal nodule measures approximately 1.6 x 2.1 cm and -2 Hounsfield units.  No worrisome lytic or sclerotic lesions.  IMPRESSION:  1.  No evidence of metastatic disease in the chest. 2.  A few scattered tiny pulmonary nodules are nonspecific. Continued attention on follow-up exams is warranted. 3.  Clustered peribronchovascular nodularity in the lingula is likely infectious or post infectious etiology. 4.  Bilateral adrenal adenomas.  Original Report Authenticated By: Reyes Ivan, M.D.   Ct Abdomen Pelvis W Contrast  03/17/2012  *RADIOLOGY REPORT*  Clinical  Data: GI bleed.  Gastric mass at endoscopy.  CT ABDOMEN AND PELVIS WITH CONTRAST  Technique:  Multidetector CT imaging of the abdomen and pelvis was performed following the standard protocol during bolus administration of intravenous contrast.  Contrast: OMNIPAQUE IOHEXOL 300 MG/ML  SOLN  Comparison: Recent endoscopy.  Findings: There is a enhancing mass along the medial wall of the stomach beginning just below the GE junction and extending down to the gastroc jejunostomy anastomoses.  It measures approximately 4.7 x 4.5 x 2.2 cm.  I do not see any findings to suggest involvement of the small bowel or perigastric fat.  There are no enlarged gastrohepatic were celiac axis lymph nodes.  No findings for hepatic metastatic disease.  Diffuse fatty infiltration of the liver is noted.  The pancreas is normal.  The spleen is normal.  Small bilateral adrenal gland nodules are likely benign adenomas.  No mesenteric or retroperitoneal mass or adenopathy.  The aorta demonstrates moderate atherosclerotic calcifications but no dissection or aneurysm.  The gallbladder is normal.  No common bile duct dilatation.  The small bowel and colon demonstrate no significant findings.  No mass lesions or inflammatory changes.  Scattered colonic diverticuli.  The  appendix is normal.  The bladder, prostate gland and seminal vesicles are normal.  No pelvic mass, adenopathy or free pelvic fluid collections.  No inguinal mass or hernia.  The bony structures are intact.  The lung bases are clear.  IMPRESSION:  1.  5 cm gastric mass without findings for locally invasive disease, adenopathy or metastasis. 2.  Surgical changes from a partial gastrectomy and Billroth II. 3.  Diffuse fatty infiltration of the liver. 4.  Small bilateral adrenal gland nodules are likely benign adenomas. 5.  Atherosclerotic calcifications involving the aorta and branch vessels but no aneurysm.  Original Report Authenticated By: P. Loralie Champagne, M.D.        IMPRESSION: Locally advanced gastric cancer. As above the patient has elected not to proceed with surgery at as this would require a total gastrectomy. Patient would be a good candidate for radiation and radiosensitizing chemotherapy directed the gastric and perigastric region. I discussed the overall treatment course side effects and potential toxicities of radiation therapy in this situation with the patient and his wife. The patient appears to understand and wishes to proceed with treatment.  PLAN: Radiation planning tomorrow afternoon. Patient will tentatively start his radiation and radiosensitizing chemotherapy on June 24 25th or 26th.  I spent 60 minutes minutes face to face with the patient and more than 50% of that time was spent in counseling and/or coordination of care.   ------------------------------------------------   Billie Lade, PhD, MD

## 2012-04-10 NOTE — Progress Notes (Signed)
See progress note under physician encounter. 

## 2012-04-11 ENCOUNTER — Encounter: Payer: Self-pay | Admitting: *Deleted

## 2012-04-11 ENCOUNTER — Other Ambulatory Visit: Payer: Self-pay

## 2012-04-11 ENCOUNTER — Other Ambulatory Visit: Payer: Medicare Other

## 2012-04-11 ENCOUNTER — Ambulatory Visit: Payer: Medicare Other

## 2012-04-11 ENCOUNTER — Other Ambulatory Visit (HOSPITAL_BASED_OUTPATIENT_CLINIC_OR_DEPARTMENT_OTHER): Payer: Medicare Other | Admitting: Lab

## 2012-04-11 ENCOUNTER — Encounter: Payer: Self-pay | Admitting: Oncology

## 2012-04-11 ENCOUNTER — Ambulatory Visit
Admission: RE | Admit: 2012-04-11 | Discharge: 2012-04-11 | Disposition: A | Discharge status: Home / Self Care | Payer: Medicare Other | Source: Ambulatory Visit | Attending: Radiation Oncology | Admitting: Radiation Oncology

## 2012-04-11 DIAGNOSIS — C169 Malignant neoplasm of stomach, unspecified: Secondary | ICD-10-CM

## 2012-04-11 LAB — CBC WITH DIFFERENTIAL/PLATELET
BASO%: 2 % (ref 0.0–2.0)
Basophils Absolute: 0.1 10*3/uL (ref 0.0–0.1)
EOS%: 1.1 % (ref 0.0–7.0)
Eosinophils Absolute: 0.1 10*3/uL (ref 0.0–0.5)
HCT: 29.8 % — ABNORMAL LOW (ref 38.4–49.9)
HGB: 9.3 g/dL — ABNORMAL LOW (ref 13.0–17.1)
LYMPH%: 22.9 % (ref 14.0–49.0)
MCH: 23.9 pg — ABNORMAL LOW (ref 27.2–33.4)
MCHC: 31.2 g/dL — ABNORMAL LOW (ref 32.0–36.0)
MCV: 76.5 fL — ABNORMAL LOW (ref 79.3–98.0)
MONO#: 0.4 10*3/uL (ref 0.1–0.9)
MONO%: 6 % (ref 0.0–14.0)
NEUT#: 4.7 10*3/uL (ref 1.5–6.5)
NEUT%: 68 % (ref 39.0–75.0)
Platelets: 443 10*3/uL — ABNORMAL HIGH (ref 140–400)
RBC: 3.9 10*6/uL — ABNORMAL LOW (ref 4.20–5.82)
RDW: 22.3 % — ABNORMAL HIGH (ref 11.0–14.6)
WBC: 6.9 10*3/uL (ref 4.0–10.3)
lymph#: 1.6 10*3/uL (ref 0.9–3.3)

## 2012-04-11 MED ORDER — CAPECITABINE 500 MG PO TABS: ORAL_TABLET | ORAL | Status: DC

## 2012-04-11 NOTE — Progress Notes (Signed)
  Radiation Oncology         [phone]) [phone] ________________________________  Name: Danny Page MRN: [id number]  Date: 04/11/2012  DOB: Feb 27, 1935  SIMULATION AND TREATMENT PLANNING NOTE  DIAGNOSIS:  Gastric cancer  NARRATIVE:  The patient was brought to the CT Simulation planning suite.  Identity was confirmed.  All relevant records and images related to the planned course of therapy were reviewed.  The patient freely provided informed written consent to proceed with treatment after reviewing the details related to the planned course of therapy. The consent form was witnessed and verified by the simulation staff.  Then, the patient was set-up in a stable reproducible  supine position for radiation therapy.  CT images were obtained.  Surface markings were placed.  The CT images were loaded into the planning software.  Then the target and avoidance structures were contoured.  Treatment planning then occurred.  The radiation prescription was entered and confirmed.  A total of 0 complex treatment devices were fabricated. I have requested : 3D Simulation  I have requested a DVH of the following structures: Heart lungs gtv liver kidneys and gastric region  I have ordered:Nutrition Consult  PLAN:  The patient will receive 50.4 Gy in 28 fractions.  He will also receive radiosensitizing chemotherapy throughout his course of treatment.   Special treatment procedure: patient will receive xeloda during his course of treatment. Given the increased potential for toxicities as well as the necessity for close monitoring of the patient and bloodwork, this constitutes a special treatment procedure.    ________________________________   Billie Lade, PhD, MD

## 2012-04-11 NOTE — Progress Notes (Signed)
Attempted to contact patient several times concerning lab results; left message for patient to call office.

## 2012-04-11 NOTE — Progress Notes (Signed)
Faxed xeloda prescription to [facility] @ [phone] phone number [phone].

## 2012-04-12 ENCOUNTER — Telehealth: Payer: Self-pay | Admitting: *Deleted

## 2012-04-12 NOTE — Telephone Encounter (Signed)
Called patient/spouse, per notes from Dr Truett Perna, hgb looking better, continue with iron, informed patient we are working on Xeloda prescription as well and he will get return calls on this from his mail order.

## 2012-04-13 ENCOUNTER — Other Ambulatory Visit: Payer: Self-pay | Admitting: *Deleted

## 2012-04-13 NOTE — Telephone Encounter (Signed)
Received note from [facility] reporting that they received referral for xeloda & will contact office in 48 hrs to provide status of investigation.

## 2012-04-14 ENCOUNTER — Encounter: Payer: Self-pay | Admitting: Oncology

## 2012-04-14 ENCOUNTER — Encounter: Payer: Self-pay | Admitting: *Deleted

## 2012-04-14 ENCOUNTER — Telehealth: Payer: Self-pay | Admitting: *Deleted

## 2012-04-14 NOTE — Telephone Encounter (Signed)
Received phone call from [facility] from [phone] asking if we do co-pay assistance for xeloda.  I will refer this message to [facility] [facility] to review & call Ramon Dredge on Monday.

## 2012-04-14 NOTE — Progress Notes (Signed)
[facility]  Clinical Social Work  Clinical Social Work received call from patient's spouse regarding concerns with their Xeloda prescription. Ms. Geoghegan states she has talked with [facility] and fears they will not be able to pay the 20% that is required before receiving the prescription. CSW discussed with Axel Filler and Vicie Mutters to determine patient's plan of action. Ebony Katrinka Blazing states [facility] has not processed the prescription and has not given the patient a cost yet. CSW called patient's spouse back and recommended she wait to hear from [facility] or call [facility] in a few business days to determine what their cost will be out of pocket.  CSW explained the financial advocacy department can provide support through applying for copay assistance programs.  CSW encouraged patient to contact her for further assistance if needed.   Kathrin Penner, MSW, [facility] Clinical Social Worker [facility] [phone]

## 2012-04-14 NOTE — Progress Notes (Signed)
Called [facility] @ [phone] per rep they will they received his xeloda prescripton and the copay is $938.00.  They will refer the patient to their copay assistance department ([facility] Dept) to contact him about copay programs.

## 2012-04-17 ENCOUNTER — Telehealth: Payer: Self-pay | Admitting: *Deleted

## 2012-04-17 ENCOUNTER — Ambulatory Visit: Payer: Medicare Other | Admitting: Internal Medicine

## 2012-04-17 DIAGNOSIS — C169 Malignant neoplasm of stomach, unspecified: Secondary | ICD-10-CM

## 2012-04-17 MED ORDER — PROCHLORPERAZINE MALEATE 10 MG PO TABS: 10.0000 mg | ORAL_TABLET | Freq: Four times a day (QID) | ORAL | Status: DC | PRN

## 2012-04-17 NOTE — Telephone Encounter (Signed)
Confirmed with wife delivery of Xeloda. Received on 04/15/12 for co pay of $1036.00. She is aware of how to take medication. They will be filling out forms for co pay assist and return to [facility] to be reimbursed for portion if they qualify. Informed wife antiemetic will be called to [facility]

## 2012-04-18 ENCOUNTER — Encounter: Payer: Self-pay | Admitting: Radiation Oncology

## 2012-04-18 ENCOUNTER — Ambulatory Visit
Admission: RE | Admit: 2012-04-18 | Discharge: 2012-04-18 | Disposition: A | Discharge status: Home / Self Care | Payer: Medicare Other | Source: Ambulatory Visit | Attending: Radiation Oncology | Admitting: Radiation Oncology

## 2012-04-18 DIAGNOSIS — C169 Malignant neoplasm of stomach, unspecified: Secondary | ICD-10-CM

## 2012-04-18 NOTE — Progress Notes (Signed)
TEACHING DONE WITH PATIENT AND WIFE.  BOTH ABLE TO RETURN INFO TO ME .  BOOKLET, RADIATION AND YOU GIVEN TO PATIENT WITH PERTINENT INFO MARKED.  ALSO WROTE DOWN IMODIUM FOR THEM SO THEY COULD PICK THIS UP IF NEEDED FOR DIARRHEA.

## 2012-04-18 NOTE — Addendum Note (Signed)
Encounter addended by: Amanda Pea, RN on: 04/18/2012  5:49 PM<BR>     Documentation filed: Visit Diagnoses

## 2012-04-19 ENCOUNTER — Ambulatory Visit
Admission: RE | Admit: 2012-04-19 | Discharge: 2012-04-19 | Disposition: A | Discharge status: Home / Self Care | Payer: Medicare Other | Source: Ambulatory Visit | Attending: Radiation Oncology | Admitting: Radiation Oncology

## 2012-04-20 ENCOUNTER — Telehealth: Payer: Self-pay | Admitting: *Deleted

## 2012-04-20 ENCOUNTER — Ambulatory Visit
Admission: RE | Admit: 2012-04-20 | Discharge: 2012-04-20 | Disposition: A | Discharge status: Home / Self Care | Payer: Medicare Other | Source: Ambulatory Visit | Attending: Radiation Oncology | Admitting: Radiation Oncology

## 2012-04-20 ENCOUNTER — Encounter: Payer: Self-pay | Admitting: Internal Medicine

## 2012-04-20 LAB — COMPREHENSIVE METABOLIC PANEL
Albumin: 3.8 g/dL (ref 3.4–5.0)
Alkaline Phosphatase: 107 U/L (ref 50–136)
BUN: 13 mg/dL (ref 7–18)
Calcium, Total: 9.1 mg/dL (ref 8.5–10.1)
Chloride: 104 mmol/L (ref 98–107)
Co2: 27 mmol/L (ref 21–32)
EGFR (Non-African Amer.): 53 — ABNORMAL LOW
Glucose: 115 mg/dL — ABNORMAL HIGH (ref 65–99)
Osmolality: 280 (ref 275–301)
Potassium: 4.2 mmol/L (ref 3.5–5.1)
SGOT(AST): 19 U/L (ref 15–37)
SGPT (ALT): 16 U/L
Total Protein: 7.3 g/dL (ref 6.4–8.2)

## 2012-04-20 LAB — PROTIME-INR: INR: 1

## 2012-04-20 LAB — CBC
MCV: 76 fL — ABNORMAL LOW (ref 80–100)
RBC: 4.32 10*6/uL — ABNORMAL LOW (ref 4.40–5.90)

## 2012-04-20 NOTE — Telephone Encounter (Signed)
At 1630 retrieved VM wife left at 1332 that husband was having pain in his chest "where the ribs join". Requested to be called. Upon entering chart, noted that radiation oncology had spoke with patient afterwards and he had been directed to the emergency room.

## 2012-04-20 NOTE — Progress Notes (Signed)
PATIENT CALLED BACK TO SAY THAT HE HAD THE CHEST PAIN AGAIN AND IT WAS WORSE.  HE SAID IT WAS EASING UP SOME BUT HIS WIFE WAS NOT HOME SO HE CALLED HIS FRIEND TO COME TAKE HIM TO THE ED.  HE LIVES IN [place] SO I TOLD HIM TO GO TO [place] INSTEAD OF COMING TO [place] SINCE IT WAS CLOSER.  HE SAID HE WOULD AND ALSO SAID HE WOULD LET us KNOW WHAT WAS GOING ON.  I TOLD HIM TO PLEASE CALL OR HAVE SOMEONE CALL TO LET us KNOW.

## 2012-04-20 NOTE — Progress Notes (Signed)
PATIENT'S WIFE CALLED FROM HER CAR TO SAY THAT HER HUSBAND WAS HAVING CHEST PAIN, SAID SHE WAS CONCERNED AND SHE WANTED ME TO CALL HIM AS SHE WAS ON THE WAY TO GET HER HAIR CUT.  I CALLED HIM AND ASKED ABOUT HIS CHEST PAIN, HE SAID IT WAS GONE NOW.  I ASKED HIM WHAT KIND OF PAIN WAS IT, SHARPE , DULL, HE SAID THAT IT WAS INTERMITTENT AND HE HAD ONLY HAD IT ONCE.  I ASKED HIM DID HE ANY OTHER SIGNS WITH IT , LIKE CLAMMY, PAIN RADIATING TO ARM OR NECK PAIN, HE ANSWERED NO.  HE SAID HE WAS PAIN FREE NOW IN THE CHEST AREA.  HE SAID HIS WIFE HAD CALLED HIM AND TOLD HIM TO TAKE A COMPAZINE.  HE DID THIS AND SAID THAT HE STILL HAD SOME BURNING THERE.  I ASKED HIM IF HE HAD SOME TUMS AND HE SAID YES.  I TOILD HIM TO TAKE 2 TUMS  AND IF THE PAIN CAME BACK AND WAS NOT RELEIVED TO CALL 911.  I ALSO TOLD HIM HE MAY NEED TO CALL DR. SHERRILL ABOUT THIS ALSO.  I TOLD HIM HE SHOULD CALL HERE AND LET us KNOW IF IT STARTED AGAIN ALSO.  HE SAID HE WOULD.

## 2012-04-21 ENCOUNTER — Ambulatory Visit: Payer: Medicare Other | Admitting: Internal Medicine

## 2012-04-21 ENCOUNTER — Observation Stay: Payer: Self-pay | Admitting: Internal Medicine

## 2012-04-21 ENCOUNTER — Telehealth: Payer: Self-pay | Admitting: *Deleted

## 2012-04-21 ENCOUNTER — Encounter: Payer: Self-pay | Admitting: *Deleted

## 2012-04-21 ENCOUNTER — Ambulatory Visit: Payer: Medicare Other

## 2012-04-21 NOTE — Telephone Encounter (Signed)
Spoke with pt's wife this am to inform her pt doesn't need to see Dr Rhea Belton; this appt was an old one when he refused the original ECL. Wife reports she took pt to [facility] yesterday for CP and he is room 106, [phone]. Wife reports pt had episodes of vomiting as well as the burning chest pain. Wife wonders if the chemo pills are too much. Informed wife I will send this message to Kem Boroughs, RN or Dr Kalman Drape nurse today for advice; wife stated understanding.

## 2012-04-21 NOTE — Progress Notes (Signed)
Dr. Truett Perna made aware of admission to [facility] and vomiting/chest pain. Very doubtful that symptoms are related to his chemotherapy-only had 3 doses of chemo and 2 radiation treatments.

## 2012-04-21 NOTE — Telephone Encounter (Signed)
Received call from [facility] office stating pt "has been admitted to [facility], will not be coming for radiation tx today". Verbally notified Melissa RT, [employer] tx machine and R Nurse, learning disability.  Will route this note to Dr Roselind Messier.

## 2012-04-24 ENCOUNTER — Ambulatory Visit
Admission: RE | Admit: 2012-04-24 | Discharge: 2012-04-24 | Disposition: A | Discharge status: Home / Self Care | Payer: Medicare Other | Source: Ambulatory Visit | Attending: Radiation Oncology | Admitting: Radiation Oncology

## 2012-04-24 ENCOUNTER — Other Ambulatory Visit: Payer: Self-pay | Admitting: *Deleted

## 2012-04-24 ENCOUNTER — Telehealth: Payer: Self-pay | Admitting: *Deleted

## 2012-04-24 DIAGNOSIS — C169 Malignant neoplasm of stomach, unspecified: Secondary | ICD-10-CM

## 2012-04-24 NOTE — Telephone Encounter (Signed)
S/w pt re appt for 7/2

## 2012-04-25 ENCOUNTER — Ambulatory Visit: Payer: Medicare Other | Admitting: Nurse Practitioner

## 2012-04-25 ENCOUNTER — Telehealth: Payer: Self-pay | Admitting: *Deleted

## 2012-04-25 ENCOUNTER — Ambulatory Visit
Admission: RE | Admit: 2012-04-25 | Discharge: 2012-04-25 | Disposition: A | Discharge status: Home / Self Care | Payer: Medicare Other | Source: Ambulatory Visit | Attending: Radiation Oncology | Admitting: Radiation Oncology

## 2012-04-25 ENCOUNTER — Telehealth: Payer: Self-pay | Admitting: Oncology

## 2012-04-25 DIAGNOSIS — C169 Malignant neoplasm of stomach, unspecified: Secondary | ICD-10-CM

## 2012-04-25 NOTE — Progress Notes (Signed)
   Department of Radiation Oncology  Phone:  [phone] Fax:        [phone]    Weekly Management Note Current Dose:7.2 Gy  Projected Dose: 50.4 Gy   Narrative:  The patient presents for routine under treatment assessment.  CBCT/MVCT images/Port film x-rays were reviewed.  The chart was checked. Patient is doing well today. He denies any further problems with chest pain. Patient did have some problems with constipation however after eating at [employer]  yesterday this problem has resolved. He is feeling quite well and denies any abdominal cramping.  The patient has not started back on Xeloda and he will discuss this issue with Dr. Truett Perna.  The patient feels his epigastric, chest pain was related to his Xeloda  Physical Findings:  The lungs are clear. The heart has a regular rhythm and rate. The abdomen is soft and nontender with normal bowel sounds.  Vitals: There were no vitals filed for this visit. Weight:  Wt Readings from Last 3 Encounters:  04/25/12 145 lb 4.8 oz (65.908 kg)  04/10/12 145 lb 9.6 oz (66.044 kg)  04/05/12 145 lb 1.6 oz (65.817 kg)   Lab Results  Component Value Date   WBC 6.9 04/11/2012   HGB 9.3* 04/11/2012   HCT 29.8* 04/11/2012   MCV 76.5* 04/11/2012   PLT 443* 04/11/2012   Lab Results  Component Value Date   CREATININE 1.00 03/17/2012   BUN 15 03/17/2012   NA 134* 03/17/2012   K 4.3 03/17/2012   CL 101 03/17/2012   CO2 25 03/17/2012     Impression:  The patient is tolerating radiation.  Plan:  Continue treatment as planned.  -----------------------------------  Billie Lade, PhD, MD

## 2012-04-25 NOTE — Telephone Encounter (Signed)
I spoke with Danny Page by telephone this afternoon. He began Xeloda and concurrent radiation on 04/19/2012. He reports developing a sharp substernal/subxiphoid chest pain within one hour of taking the first dose of Xeloda. This also occurred after the second dose of Xeloda. The next morning he had nausea and vomiting after eating cream of wheat. He then developed sharp pain after taking the next dose of Xeloda. No diarrhea or mouth sores.  He reports being evaluated at the aliments emergency room and was admitted overnight (we do not have records available today). The pain spontaneously resolved over 2030 minutes on each occasion. There were no associated symptoms including dyspnea, diaphoresis, and the pain did not radiate. He reports no difficulty swallowing pills.  Danny Page completed the scheduled radiation on 04/24/2012 and 04/25/2012.  I explained this would be an unusual toxicity from Xeloda chemotherapy. We decided to resume Xeloda beginning on 04/26/2012. He will take Xeloda at a dose of 500 mg twice daily. He will take the next dose of Xeloda upon arrival at the cancer Center on 04/26/2012. He will contact us if he develops pain on 04/26/2012. The Xeloda will be dose escalated if he tolerates the lower dose.

## 2012-04-25 NOTE — Telephone Encounter (Signed)
Phone call to patient to assess why he was a "no show" for the 1015 appointment with NP.  Patient's wife stated they were aware of today's appointment, but when they got to radiation, they were told they did not have an appointment.  Dr. Truett Perna aware and will call the patient at home.  Wife was informed to expect a call from MD.

## 2012-04-25 NOTE — Progress Notes (Signed)
HERE TODAY FOR PUT OF STOMACH.  HAS STOPPED TAKING XELODA AND SAYS HE IS FEELING BETTER.  WAS ADMITTED TO [facility] Thursday AND DISCHARGED Friday FOR CHEST PAIN, DIAGNOSIS WAS REFLUX.  HE SAYS HE IS FEELING BETTER.

## 2012-04-26 ENCOUNTER — Ambulatory Visit
Admission: RE | Admit: 2012-04-26 | Discharge: 2012-04-26 | Disposition: A | Discharge status: Home / Self Care | Payer: Medicare Other | Source: Ambulatory Visit | Attending: Radiation Oncology | Admitting: Radiation Oncology

## 2012-04-28 ENCOUNTER — Ambulatory Visit
Admission: RE | Admit: 2012-04-28 | Discharge: 2012-04-28 | Disposition: A | Discharge status: Home / Self Care | Payer: Medicare Other | Source: Ambulatory Visit | Attending: Radiation Oncology | Admitting: Radiation Oncology

## 2012-05-01 ENCOUNTER — Ambulatory Visit
Admission: RE | Admit: 2012-05-01 | Discharge: 2012-05-01 | Disposition: A | Discharge status: Home / Self Care | Payer: Medicare Other | Source: Ambulatory Visit | Attending: Radiation Oncology | Admitting: Radiation Oncology

## 2012-05-01 ENCOUNTER — Other Ambulatory Visit (HOSPITAL_BASED_OUTPATIENT_CLINIC_OR_DEPARTMENT_OTHER): Payer: Medicare Other | Admitting: Lab

## 2012-05-01 ENCOUNTER — Telehealth: Payer: Self-pay | Admitting: Oncology

## 2012-05-01 ENCOUNTER — Ambulatory Visit (HOSPITAL_BASED_OUTPATIENT_CLINIC_OR_DEPARTMENT_OTHER): Payer: Medicare Other | Admitting: Nurse Practitioner

## 2012-05-01 VITALS — BP 115/57 | HR 80 | Temp 97.6°F | Ht 67.0 in | Wt 142.0 lb

## 2012-05-01 DIAGNOSIS — C169 Malignant neoplasm of stomach, unspecified: Secondary | ICD-10-CM

## 2012-05-01 DIAGNOSIS — D509 Iron deficiency anemia, unspecified: Secondary | ICD-10-CM

## 2012-05-01 LAB — CBC WITH DIFFERENTIAL/PLATELET
BASO%: 0.7 % (ref 0.0–2.0)
Eosinophils Absolute: 0.2 10*3/uL (ref 0.0–0.5)
LYMPH%: 11.8 % — ABNORMAL LOW (ref 14.0–49.0)
MCHC: 31.7 g/dL — ABNORMAL LOW (ref 32.0–36.0)
MONO#: 0.8 10*3/uL (ref 0.1–0.9)
NEUT#: 5.3 10*3/uL (ref 1.5–6.5)
RBC: 4.34 10*6/uL (ref 4.20–5.82)
RDW: 20.7 % — ABNORMAL HIGH (ref 11.0–14.6)
WBC: 7.1 10*3/uL (ref 4.0–10.3)
lymph#: 0.8 10*3/uL — ABNORMAL LOW (ref 0.9–3.3)

## 2012-05-01 NOTE — Progress Notes (Signed)
OFFICE PROGRESS NOTE  Interval history:  Danny Page returns after missing a visit last week. He resumed Xeloda at a dose of 500 mg daily on 04/26/2012. He began 1000 mg twice daily on 04/28/2012. He denies chest pain. He had a single episode of nausea which he felt was unrelated to the Xeloda. He denies mouth sores. No diarrhea. No hand or foot pain or redness. He notes progressive fatigue.   Objective: Blood pressure 115/57, pulse 80, temperature 97.6 F (36.4 C), temperature source Oral, height 5\' 7"  (1.702 m), weight 142 lb (64.411 kg).  Oropharynx is without thrush or ulceration. Faint right carotid bruit. Lungs clear. Regular cardiac rhythm. Distant heart sounds. Abdomen soft and nontender. Extremities without edema.  Lab Results: Lab Results  Component Value Date   WBC 7.1 05/01/2012   HGB 10.4* 05/01/2012   HCT 32.9* 05/01/2012   MCV 75.9* 05/01/2012   PLT 209 05/01/2012    Chemistry:    Chemistry      Component Value Date/Time   NA 134* 03/17/2012 1209   K 4.3 03/17/2012 1209   CL 101 03/17/2012 1209   CO2 25 03/17/2012 1209   BUN 15 03/17/2012 1209   CREATININE 1.00 03/17/2012 1209      Component Value Date/Time   CALCIUM 8.1* 03/17/2012 1209   ALKPHOS 76 03/17/2012 1209   AST 13 03/17/2012 1209   ALT 8 03/17/2012 1209   BILITOT 0.2* 03/17/2012 1209       Studies/Results: No results found.  Medications: I have reviewed the patient's current medications.  Assessment/Plan:  1. Adenocarcinoma of the stomach. Adenocarcinoma found in the gastric remnant status post previous partial gastrectomy/Billroth II anastomosis. No evidence of metastatic disease by physical exam or CT of the abdomen/pelvis. Staging EUS on 03/30/2012 confirmed a uT3 uN0 tumor. He began radiation and concurrent Xeloda chemotherapy on 04/18/2012. Xeloda was subsequently placed on hold due to chest pain. Xeloda was resumed on 04/26/2012 with no further chest pain. The Xeloda dose was titrated to 1000 mg twice daily  beginning 04/28/2012. 2. Remote partial gastrectomy/Billroth II anastomosis secondary to ulcer disease. 3. Iron deficiency anemia secondary to the gastric mass status post red cell transfusion 03/17/2012. 4. COPD. 5. History of colon polyps. 6. Chest pain. Unlikely the chest pain was related to Xeloda. Question reflux.  Disposition-Danny Page will continue Xeloda at 1000 mg twice daily on days of radiation. He will contact the office with recurrent chest pain. We will see him in followup on 05/10/2012.  Plan reviewed with Dr. Truett Perna.  Lonna Cobb ANP/GNP-BC

## 2012-05-01 NOTE — Telephone Encounter (Signed)
appts made and printed for pt aom °

## 2012-05-02 ENCOUNTER — Encounter: Payer: Self-pay | Admitting: Radiation Oncology

## 2012-05-02 ENCOUNTER — Ambulatory Visit
Admission: RE | Admit: 2012-05-02 | Discharge: 2012-05-02 | Disposition: A | Discharge status: Home / Self Care | Payer: Medicare Other | Source: Ambulatory Visit | Attending: Radiation Oncology | Admitting: Radiation Oncology

## 2012-05-02 VITALS — BP 117/69 | HR 97 | Temp 97.0°F | Resp 20 | Wt 141.2 lb

## 2012-05-02 DIAGNOSIS — C169 Malignant neoplasm of stomach, unspecified: Secondary | ICD-10-CM

## 2012-05-02 NOTE — Progress Notes (Addendum)
Weekly Management Note Current Dose:14.4 Gy  Projected Dose: 50.4 Gy   Narrative:  The patient presents for routine under treatment assessment.  CBCT/MVCT images/Port film x-rays were reviewed.  The chart was checked. The patient did start back on Xeloda and seems to be tolerating this well.  He did have some mild anterior chest discomfort earlier today which resolved spontaneously.  He denies any nausea at this time. Patient continues have a poor appetite but has had this issue most of his life.  Physical Findings:  Patient's weight is 141 pounds.  The lungs are clear. The heart has a regular rhythm and rate. The abdomen is soft and nontender with normal bowel sounds.  The blood pressure shows no orthostatic changes. The oral cavity is moist without secondary infection.  Vitals:  Filed Vitals:   05/02/12 1343  BP: 117/69  Pulse: 97  Temp:   Resp:    Weight:  Wt Readings from Last 3 Encounters:  05/02/12 141 lb 3.2 oz (64.048 kg)  05/01/12 142 lb (64.411 kg)  04/25/12 145 lb 4.8 oz (65.908 kg)   Lab Results  Component Value Date   WBC 7.1 05/01/2012   HGB 10.4* 05/01/2012   HCT 32.9* 05/01/2012   MCV 75.9* 05/01/2012   PLT 209 05/01/2012   Lab Results  Component Value Date   CREATININE 1.00 03/17/2012   BUN 15 03/17/2012   NA 134* 03/17/2012   K 4.3 03/17/2012   CL 101 03/17/2012   CO2 25 03/17/2012     Impression:  The patient is tolerating radiation.  Plan:  Continue treatment as planned.

## 2012-05-02 NOTE — Progress Notes (Signed)
Patient, alert, oriented x3,steady gait, flat affect, has increased his xeloda to 1000mg  bid, no c/o nausea,diarrhea, had scambled egg and coffee at 8am o=830am, then around ten am had pain at sternum for about 15 minutes resolved on its own,did take his protonix before breakfast, slight fatigued, eats fair 1:41 PM

## 2012-05-03 ENCOUNTER — Telehealth: Payer: Self-pay | Admitting: *Deleted

## 2012-05-03 ENCOUNTER — Ambulatory Visit
Admission: RE | Admit: 2012-05-03 | Discharge: 2012-05-03 | Disposition: A | Discharge status: Home / Self Care | Payer: Medicare Other | Source: Ambulatory Visit | Attending: Radiation Oncology | Admitting: Radiation Oncology

## 2012-05-03 NOTE — Telephone Encounter (Signed)
Pt's wife called to report that pt is also having left arm pain. She has just given him a dose of antacid. Pt is not diaphoretic or dyspneic. She is concerned this may be a cardiac issue.  Instructed her to take pt to ED for evaluation. Mrs. Loudin agrees to do so. Reviewed with Misty Stanley, NP, agrees with above instructions.

## 2012-05-03 NOTE — Telephone Encounter (Signed)
Call from pt's wife reporting he is having chest pain similar to the time he had to go to ED. He was told then it was likely reflux. Reviewed with Dr. Truett Perna: Order received to have pt take Maalox or Mylanta to see if this helps. She denies pt is having any dyspnea. Not diaphoretic. Instructed her to take pt to ED if he develops dyspnea associated with this chest pain. She repeated instructions and voiced understanding.

## 2012-05-04 ENCOUNTER — Other Ambulatory Visit: Payer: Self-pay | Admitting: *Deleted

## 2012-05-04 ENCOUNTER — Ambulatory Visit: Payer: Medicare Other | Admitting: Oncology

## 2012-05-04 ENCOUNTER — Ambulatory Visit
Admission: RE | Admit: 2012-05-04 | Discharge: 2012-05-04 | Disposition: A | Discharge status: Home / Self Care | Payer: Medicare Other | Source: Ambulatory Visit | Attending: Radiation Oncology | Admitting: Radiation Oncology

## 2012-05-04 DIAGNOSIS — K2970 Gastritis, unspecified, without bleeding: Secondary | ICD-10-CM

## 2012-05-04 MED ORDER — SUCRALFATE 1 GM/10ML PO SUSP: 1.0000 g | Freq: Three times a day (TID) | ORAL | Status: DC

## 2012-05-04 MED ORDER — PANTOPRAZOLE SODIUM 40 MG PO TBEC: 40.0000 mg | DELAYED_RELEASE_TABLET | Freq: Two times a day (BID) | ORAL | Status: DC

## 2012-05-04 NOTE — Telephone Encounter (Signed)
Patient presented to office with wife today after completion of radiation appointment reporting continued "severe gastritis" that has not responded to OTC liquid antacid and his daily Protonix. Reports often has severe pain in his left shoulder and arm when he has the flares of pain. Asking for something to relieve his symptoms. Per Dr. Truett Perna: Most likely gastric irritation from radiation field and doubts due to Xeloda. Called in Carafate slurry qid and increased protonix to twice daily. Told wife he should be getting some relief in next 48 hours. Let us know if he does not and Dr. Truett Perna may refer him back to his GI physician, Dr. Rhea Belton.

## 2012-05-05 ENCOUNTER — Ambulatory Visit: Payer: Medicare Other | Admitting: Nutrition

## 2012-05-05 ENCOUNTER — Encounter: Payer: Self-pay | Admitting: *Deleted

## 2012-05-05 ENCOUNTER — Ambulatory Visit
Admission: RE | Admit: 2012-05-05 | Discharge: 2012-05-05 | Disposition: A | Discharge status: Home / Self Care | Payer: Medicare Other | Source: Ambulatory Visit | Attending: Radiation Oncology | Admitting: Radiation Oncology

## 2012-05-05 ENCOUNTER — Other Ambulatory Visit: Payer: Self-pay | Admitting: *Deleted

## 2012-05-05 DIAGNOSIS — C169 Malignant neoplasm of stomach, unspecified: Secondary | ICD-10-CM

## 2012-05-05 MED ORDER — HYDROCODONE-ACETAMINOPHEN 7.5-500 MG/15ML PO SOLN: 15.0000 mL | Freq: Four times a day (QID) | ORAL | Status: DC | PRN

## 2012-05-05 NOTE — Progress Notes (Signed)
Mr. and Ms. Winterbottom requested to speak with me today prior to his radiation appointment.  The patient is complaining of pain and heartburn.  He is tolerating tea with sugar but he is not able to eat much.  He has been given Carafate as of yesterday and has taken several doses of this.  He does complain of issues with nausea.  He is occasionally taking his medication but he does not take it consistently.   NUTRITION DIAGNOSIS:  Food and nutrition related knowledge deficit continues.  INTERVENTION:  I have encouraged the patient to take his medication as prescribed by his physicians, including nausea medicine.  I have encouraged small amounts of bland foods throughout the day with the focus on increased protein.  I have encouraged the patient to contact me for followup to evaluate oral intake and his weight.  MONITORING/EVALUATION/GOALS:  The patient has been unable to tolerate his oral diet and has resulted in weight loss.  NEXT VISIT:  The patient will contact me for followup appointment.  ______________________________ Zenovia Jarred, RD,CSO,LDN Clinical Nutrition Specialist BN/MEDQ  D:  05/05/2012  T:  05/05/2012  Job:  [id number]

## 2012-05-05 NOTE — Progress Notes (Signed)
Patient and wife present to lobby requesting to see nurse to discuss continued pain he is having in his left shoulder area and down his arm that he reports tends to become severe after his radiation treatment.  Per Dr. Truett Perna: Just hold the Xeloda today and called in script for liquid Lortab to take every 6 hours if needed for pain. Suggested he routinely take dose after his radiation treatment to see if he can prevent the pain crisis. Let office know if this is not effective.

## 2012-05-05 NOTE — Progress Notes (Signed)
REVIEWED SUSAN'S NOTE THAT IF THE CARAFATE DID NOT WORK THAT HE WAS TO LET DR. SHERRILL KNOW AND HE MAY REFER HIM BACK TO HIS GI DOCTOR, DR. PYRTLE.  SO PATIENT WAS SENT TO MED ONC

## 2012-05-05 NOTE — Progress Notes (Signed)
PATIENT'S WIFE ASKED TO SPEAK TO ME WHILE HE WAS GETTING TX TODAY.  SHE SAID THAT THE PT WAS HAVING "UNBEARABLE PAIN" ABOUT 1300 EVERY DAY AFTER TX.  SHE SHOWS ME A BOTTLE OF CARAFATE THAT DR. SHERRILL ORDERED FOR HIM YESTERDAY.  SHE SAID THAT THIS WAS NOT HELPING HIS PAIN.  SHE SAID THAT DR. SHERRILL'S NURSE TOLD HER THAT THE RADIATION WAS CAUSING THE PAIN HE WAS HAVING.  SINCE DR. KINARD ISN'T HERE TODAY I TOLD HER TO GO UPSTAIRS AND ASK TO SPEAK TO DR. SHERRILL'S NURSE AND SEE WHAT HIS THOUGHTS WERE ON GIVING HIM PAIN MEDICATION.  SHE WAS FINE WITH THIS AND THANKED ME FOR MY TIME.  SHE LEFT TO GO TO MED ONC AND TALK TO DR. SHERRILL

## 2012-05-08 ENCOUNTER — Ambulatory Visit
Admission: RE | Admit: 2012-05-08 | Discharge: 2012-05-08 | Disposition: A | Discharge status: Home / Self Care | Payer: Medicare Other | Source: Ambulatory Visit | Attending: Radiation Oncology | Admitting: Radiation Oncology

## 2012-05-09 ENCOUNTER — Ambulatory Visit: Payer: Medicare Other

## 2012-05-09 ENCOUNTER — Ambulatory Visit
Admission: RE | Admit: 2012-05-09 | Discharge: 2012-05-09 | Disposition: A | Discharge status: Home / Self Care | Payer: Medicare Other | Source: Ambulatory Visit | Attending: Radiation Oncology | Admitting: Radiation Oncology

## 2012-05-09 VITALS — BP 132/73 | HR 76 | Temp 97.7°F | Wt 140.5 lb

## 2012-05-09 DIAGNOSIS — C169 Malignant neoplasm of stomach, unspecified: Secondary | ICD-10-CM

## 2012-05-09 NOTE — Progress Notes (Signed)
Weekly Management Note Current Dose:23.4 Gy  Projected Dose:50.4 Gy   Narrative:  The patient presents for routine under treatment assessment.  CBCT/MVCT images/Port film x-rays were reviewed.  The chart was checked. He continues to have a poor appetite but does note importance of food intake. Patient denies any nausea. He denies any loose bowels or diarrhea. Patient is taking his chemotherapy is recommended. Patient denies any further pain in the chest. In retrospect he  feels this was related related to consuming [place] food.  Physical Findings:  No palpable supraclavicular adenopathy. The lungs are clear to auscultation. The heart has regular rhythm and rate. The abdomen is soft and nontender with normal bowel sounds.  Vitals:  Filed Vitals:   05/09/12 1340  BP: 132/73  Pulse: 76  Temp: 97.7 F (36.5 C)   Weight:  Wt Readings from Last 3 Encounters:  05/09/12 140 lb 8 oz (63.73 kg)  05/02/12 141 lb 3.2 oz (64.048 kg)  05/01/12 142 lb (64.411 kg)   Lab Results  Component Value Date   WBC 7.1 05/01/2012   HGB 10.4* 05/01/2012   HCT 32.9* 05/01/2012   MCV 75.9* 05/01/2012   PLT 209 05/01/2012   Lab Results  Component Value Date   CREATININE 1.00 03/17/2012   BUN 15 03/17/2012   NA 134* 03/17/2012   K 4.3 03/17/2012   CL 101 03/17/2012   CO2 25 03/17/2012     Impression:  The patient is tolerating radiation except for issues as above.  Plan:  Continue treatment as planned. -----------------------------------  Billie Lade, PhD, MD

## 2012-05-09 NOTE — Progress Notes (Signed)
13/25 fractions.  States he has decreased appetite and all food taste like sawdust.  He eats because he knows he should.  Denies, pain, diarrhea or N/V.  Takes naps during the day.  Reports feeling light-headed when he stands.

## 2012-05-10 ENCOUNTER — Other Ambulatory Visit (HOSPITAL_BASED_OUTPATIENT_CLINIC_OR_DEPARTMENT_OTHER): Payer: Medicare Other | Admitting: Lab

## 2012-05-10 ENCOUNTER — Ambulatory Visit: Payer: Medicare Other | Admitting: Nutrition

## 2012-05-10 ENCOUNTER — Ambulatory Visit (HOSPITAL_BASED_OUTPATIENT_CLINIC_OR_DEPARTMENT_OTHER): Payer: Medicare Other | Admitting: Oncology

## 2012-05-10 ENCOUNTER — Ambulatory Visit
Admission: RE | Admit: 2012-05-10 | Discharge: 2012-05-10 | Disposition: A | Discharge status: Home / Self Care | Payer: Medicare Other | Source: Ambulatory Visit | Attending: Radiation Oncology | Admitting: Radiation Oncology

## 2012-05-10 DIAGNOSIS — C169 Malignant neoplasm of stomach, unspecified: Secondary | ICD-10-CM

## 2012-05-10 LAB — CBC WITH DIFFERENTIAL/PLATELET
BASO%: 1.3 % (ref 0.0–2.0)
Eosinophils Absolute: 0.1 10*3/uL (ref 0.0–0.5)
HCT: 31.7 % — ABNORMAL LOW (ref 38.4–49.9)
HGB: 10.1 g/dL — ABNORMAL LOW (ref 13.0–17.1)
MCHC: 31.9 g/dL — ABNORMAL LOW (ref 32.0–36.0)
MONO#: 0.7 10*3/uL (ref 0.1–0.9)
NEUT#: 2.7 10*3/uL (ref 1.5–6.5)
NEUT%: 67.7 % (ref 39.0–75.0)
WBC: 4 10*3/uL (ref 4.0–10.3)
lymph#: 0.5 10*3/uL — ABNORMAL LOW (ref 0.9–3.3)

## 2012-05-10 NOTE — Progress Notes (Signed)
Danny Page continues to have difficulty eating.  He was to see Dr. Truett Perna today, and his nurse called and asked if I could provide samples of clear liquid nutrition supplements for the patient.  NUTRITION DIAGNOSIS:  Food and nutrition related knowledge deficit continues.  INTERVENTION:  I have spoken to the patient briefly and encouraged him to continue to try foods and liquids as tolerated.  I have provided samples of Resource Breeze and Ensure Clear and information on where these can be purchased or ordered based on the patient's preference. Patient and wife appreciative of samples.  MONITORING/EVALUATION/GOALS:  The patient continues to be unable to tolerate oral diet and has had continued weight loss.  NEXT VISIT:  Patient and/or wife will contact me for further questions or concerns.   ______________________________ Zenovia Jarred, RD,CSO,LDN Clinical Nutrition Specialist BN/MEDQ  D:  05/10/2012  T:  05/10/2012  Job:  [id number]

## 2012-05-10 NOTE — Progress Notes (Signed)
   [facility]    OFFICE PROGRESS NOTE   INTERVAL HISTORY:   He continues to have intermittent subxiphoid pain. He describes this as a "stabbing "pain lasting for approximately 20 minutes. The pain is relieved with hydrocodone elixir. He continues Xeloda and daily radiation. No mouth sores, diarrhea, or hand foot pain/erythema. He takes the hydrocodone and frequently.  Objective:  Vital signs in last 24 hours:  There were no vitals taken for this visit.    HEENT: No thrush or ulcers Resp: Lungs with inspiratory rails at the posterior base bilaterally, no respiratory distress, mild wheeze at the upper anterior chest on the left Cardio: Regular rate and rhythm GI: No hepatomegaly, nontender, no mass Vascular: No leg edema  Skin: Palms without erythema     Lab Results:  Lab Results  Component Value Date   WBC 4.0 05/10/2012   HGB 10.1* 05/10/2012   HCT 31.7* 05/10/2012   MCV 74.4* 05/10/2012   PLT 223 05/10/2012   ANC 2.7    Medications: I have reviewed the patient's current medications.  Assessment/Plan: 1. Adenocarcinoma of the stomach. Adenocarcinoma found in the gastric remnant status post previous partial gastrectomy/Billroth II anastomosis. No evidence of metastatic disease by physical exam or CT of the abdomen/pelvis. Staging EUS on 03/30/2012 confirmed a uT3 uN0 tumor. He began radiation and concurrent Xeloda chemotherapy on 04/18/2012. Xeloda was subsequently placed on hold due to chest pain. Xeloda was resumed on 04/26/2012 with no further chest pain. The Xeloda dose was titrated to 1000 mg twice daily beginning 04/28/2012. 2. Remote partial gastrectomy/Billroth II anastomosis secondary to ulcer disease. 3. Iron deficiency anemia secondary to the gastric mass status post red cell transfusion 03/17/2012. The hemoglobin is stable 4. COPD. 5. History of colon polyps. 6. Chest pain/subxiphoid pain-most likely related to radiation esophagitis/gastritis or  the gastric mass-he will continue an acid therapy and hydrocodone as needed 7. Anorexia/weight loss-he met with the cancer Center nutritionist today.  Disposition:  [name] appears to be tolerating the Xeloda well. The subxiphoid pain appears improved. He has taken hydrocodone and frequently over the past few days. He will continue daily radiation and Xeloda.  Mr. Arif will return for an office visit in 2 weeks.   Thornton Papas, MD  05/10/2012  12:44 PM

## 2012-05-11 ENCOUNTER — Ambulatory Visit
Admission: RE | Admit: 2012-05-11 | Discharge: 2012-05-11 | Disposition: A | Discharge status: Home / Self Care | Payer: Medicare Other | Source: Ambulatory Visit | Attending: Radiation Oncology | Admitting: Radiation Oncology

## 2012-05-11 ENCOUNTER — Telehealth: Payer: Self-pay | Admitting: *Deleted

## 2012-05-11 NOTE — Telephone Encounter (Signed)
Left voice message to inform the patient of the new date and time of the midlevel appointment

## 2012-05-12 ENCOUNTER — Ambulatory Visit
Admission: RE | Admit: 2012-05-12 | Discharge: 2012-05-12 | Disposition: A | Discharge status: Home / Self Care | Payer: Medicare Other | Source: Ambulatory Visit | Attending: Radiation Oncology | Admitting: Radiation Oncology

## 2012-05-12 DIAGNOSIS — C169 Malignant neoplasm of stomach, unspecified: Secondary | ICD-10-CM

## 2012-05-15 ENCOUNTER — Ambulatory Visit
Admission: RE | Admit: 2012-05-15 | Discharge: 2012-05-15 | Disposition: A | Discharge status: Home / Self Care | Payer: Medicare Other | Source: Ambulatory Visit | Attending: Radiation Oncology | Admitting: Radiation Oncology

## 2012-05-16 ENCOUNTER — Ambulatory Visit
Admission: RE | Admit: 2012-05-16 | Discharge: 2012-05-16 | Disposition: A | Discharge status: Home / Self Care | Payer: Medicare Other | Source: Ambulatory Visit | Attending: Radiation Oncology | Admitting: Radiation Oncology

## 2012-05-16 DIAGNOSIS — C169 Malignant neoplasm of stomach, unspecified: Secondary | ICD-10-CM

## 2012-05-16 MED ORDER — RADIAPLEXRX EX GEL
Freq: Once | CUTANEOUS | Status: AC
  Administered 2012-05-16: 1 via TOPICAL

## 2012-05-16 NOTE — Addendum Note (Signed)
Encounter addended by: Amanda Pea, RN on: 05/16/2012  1:33 PM<BR>     Documentation filed: Inpatient MAR, Orders

## 2012-05-16 NOTE — Progress Notes (Signed)
HERE TODAY FOR PUT OF ABDOMEN.  HAS NO APPETITE AND HAS HAD A SIGNIFICANT WT LOSS OVER A PERIOD OF TIME.  NO PAIN NOW BUT SAYS HE ALWAYS HAS PAIN AFTER TX BUT SAID DR. SHERRILL GAVE PAIN MED. AND IT HELPS .  WIFE SAYS THAT SHE TRIES DIFFERENT THINGS TO HELP HIM EAT BUT HE JUST DOESN'T WANT IT.  COLOR IS PALE TODAY.  HAS WHAT APPEARS TO BE SOME RADIATION DERMATITIS ON BACK.  ABDOMEN LOOKS GOOD

## 2012-05-16 NOTE — Progress Notes (Signed)
Weekly Management Note Current Dose:32.4 Gy  Projected Dose:50.4 Gy   Narrative:  The patient presents for routine under treatment assessment.  CBCT/MVCT images/Port film x-rays were reviewed.  The chart was checked. C/o decreased appetite and skin irritation posteriorly.   Physical Findings:  Dry desquamation over back.   Vitals: There were no vitals filed for this visit. Weight:  Wt Readings from Last 3 Encounters:  05/16/12 137 lb 14.4 oz (62.551 kg)  05/09/12 140 lb 8 oz (63.73 kg)  05/02/12 141 lb 3.2 oz (64.048 kg)   Lab Results  Component Value Date   WBC 4.0 05/10/2012   HGB 10.1* 05/10/2012   HCT 31.7* 05/10/2012   MCV 74.4* 05/10/2012   PLT 223 05/10/2012   Lab Results  Component Value Date   CREATININE 1.00 03/17/2012   BUN 15 03/17/2012   NA 134* 03/17/2012   K 4.3 03/17/2012   CL 101 03/17/2012   CO2 25 03/17/2012     Impression:  The patient is tolerating radiation.  Plan:  Continue treatment as planned.Add radiaplex to back. Consult nutrition. Place TLDs on skin and ask for physics to check plan.

## 2012-05-16 NOTE — Progress Notes (Signed)
CALLED BARB NEFF PER DR. Luciano Cutter ORDER FOR HER TO ASSESS PATIENT FOR WT LOSS.  LEFT VOICE MAIL WITH BARB WITH TIME OF PATIENT'S APPOINT AND ASKED HER IF SHE COULD SEE HIM TOM

## 2012-05-17 ENCOUNTER — Ambulatory Visit
Admission: RE | Admit: 2012-05-17 | Discharge: 2012-05-17 | Disposition: A | Discharge status: Home / Self Care | Payer: Medicare Other | Source: Ambulatory Visit | Attending: Radiation Oncology | Admitting: Radiation Oncology

## 2012-05-18 ENCOUNTER — Ambulatory Visit
Admission: RE | Admit: 2012-05-18 | Discharge: 2012-05-18 | Disposition: A | Discharge status: Home / Self Care | Payer: Medicare Other | Source: Ambulatory Visit | Attending: Radiation Oncology | Admitting: Radiation Oncology

## 2012-05-19 ENCOUNTER — Ambulatory Visit
Admission: RE | Admit: 2012-05-19 | Discharge: 2012-05-19 | Disposition: A | Discharge status: Home / Self Care | Payer: Medicare Other | Source: Ambulatory Visit | Attending: Radiation Oncology | Admitting: Radiation Oncology

## 2012-05-22 ENCOUNTER — Ambulatory Visit
Admission: RE | Admit: 2012-05-22 | Discharge: 2012-05-22 | Disposition: A | Discharge status: Home / Self Care | Payer: Medicare Other | Source: Ambulatory Visit | Attending: Radiation Oncology | Admitting: Radiation Oncology

## 2012-05-23 ENCOUNTER — Ambulatory Visit
Admission: RE | Admit: 2012-05-23 | Discharge: 2012-05-23 | Disposition: A | Discharge status: Home / Self Care | Payer: Medicare Other | Source: Ambulatory Visit | Attending: Radiation Oncology | Admitting: Radiation Oncology

## 2012-05-23 ENCOUNTER — Encounter: Payer: Self-pay | Admitting: Radiation Oncology

## 2012-05-23 VITALS — BP 106/66 | HR 87 | Resp 18 | Wt 136.2 lb

## 2012-05-23 DIAGNOSIS — C169 Malignant neoplasm of stomach, unspecified: Secondary | ICD-10-CM | POA: Insufficient documentation

## 2012-05-23 MED ORDER — LORAZEPAM 0.5 MG PO TABS: 0.5000 mg | ORAL_TABLET | Freq: Three times a day (TID) | ORAL | Status: AC

## 2012-05-23 NOTE — Progress Notes (Signed)
Weekly Management Note Current Dose:41.4 Gy  Projected Dose:50.4 Gy   Narrative:  The patient presents for routine under treatment assessment.  CBCT/MVCT images/Port film x-rays were reviewed.  The chart was checked. He is having significant problems with nausea at this time. Patient does have Compazine for this issue. This is minimally helpful at this time.  He complains of dry heaves with eating food.  He denies any dizziness with standing. Orthostatically patient's pulse increases approximate 10 points, however his blood pressure remains  stable with standing. Did offer IV fluids for the patient today but in light of his difficulty with IV sticks he is hesitant to consider this.  The patient was given a prescription for sublingual Ativan to see if this will help with this issue.  Physical Findings:  The lungs are clear. The heart has a regular rhythm and rate. Examination of the back area reveals hyperpigmentation changes and mild erythema. There is no moist desquamation noted. The patient is having radia Plex placed on this area twice a day after his treatment.  Vitals:  Filed Vitals:   05/23/12 1128  BP: 106/66  Pulse: 87  Resp: 18   Weight:  Wt Readings from Last 3 Encounters:  05/23/12 136 lb 3.2 oz (61.78 kg)  05/16/12 137 lb 14.4 oz (62.551 kg)  05/09/12 140 lb 8 oz (63.73 kg)   Lab Results  Component Value Date   WBC 4.0 05/10/2012   HGB 10.1* 05/10/2012   HCT 31.7* 05/10/2012   MCV 74.4* 05/10/2012   PLT 223 05/10/2012   Lab Results  Component Value Date   CREATININE 1.00 03/17/2012   BUN 15 03/17/2012   NA 134* 03/17/2012   K 4.3 03/17/2012   CL 101 03/17/2012   CO2 25 03/17/2012     Impression:  The patient is having side effects as above this treatment. Hopefully with the sublingual Ativan this will help  Plan:  Continue treatment as planned. He has 5 more treatments remaining. Should mention that the patient has been able to keep his chemotherapy pills  down. -----------------------------------  Billie Lade, PhD, MD

## 2012-05-23 NOTE — Progress Notes (Signed)
Patient presents to the clinic today following treatment accompanied by his wife for PUT with Dr. Roselind Messier. Patient is alert and oriented to person, place, and time. No distress noted. Patient being pushed in wheelchair but, steady gait noted. Flat affect noted. Patient reports intermittent sternal pain for which he take Lortab elixir. Patient was prescribed carafate elixir but, has been unable to take because it causes him to immediately vomit. Slow steady decline in weight noted but, patient denies having an appetite at all. Patient states everything he eats he vomits back up. Patient taking protonix for reflux. Patient reports taking compazine for nausea. Patient reports that he had a bowel movement this morning. Patient's wife reports posterior skin irritation has improved with the regular use of Radiaplex. Reported all findings to Dr. Roselind Messier.   Sitting 87, 109/66  Standing 97, 109/69

## 2012-05-24 ENCOUNTER — Ambulatory Visit
Admission: RE | Admit: 2012-05-24 | Discharge: 2012-05-24 | Disposition: A | Discharge status: Home / Self Care | Payer: Medicare Other | Source: Ambulatory Visit | Attending: Radiation Oncology | Admitting: Radiation Oncology

## 2012-05-24 ENCOUNTER — Telehealth: Payer: Self-pay | Admitting: Oncology

## 2012-05-24 NOTE — Telephone Encounter (Signed)
Returned wife's call re pt not being able to make 8/1 appt. Per wife she was onlu infomed a couple of days ago and pt can't make it. lmonvm for wife to call me back. As of right now pt still on schedule for 8/1. Message to desk nurse.

## 2012-05-25 ENCOUNTER — Ambulatory Visit: Payer: Medicare Other | Admitting: Nurse Practitioner

## 2012-05-25 ENCOUNTER — Telehealth: Payer: Self-pay | Admitting: Oncology

## 2012-05-25 ENCOUNTER — Ambulatory Visit
Admission: RE | Admit: 2012-05-25 | Discharge: 2012-05-25 | Disposition: A | Discharge status: Home / Self Care | Payer: Medicare Other | Source: Ambulatory Visit | Attending: Radiation Oncology | Admitting: Radiation Oncology

## 2012-05-25 NOTE — Telephone Encounter (Signed)
appts made and l/m for pt on vm   aom

## 2012-05-26 ENCOUNTER — Other Ambulatory Visit: Payer: Self-pay | Admitting: *Deleted

## 2012-05-26 ENCOUNTER — Other Ambulatory Visit (HOSPITAL_BASED_OUTPATIENT_CLINIC_OR_DEPARTMENT_OTHER): Payer: Medicare Other | Admitting: Lab

## 2012-05-26 ENCOUNTER — Ambulatory Visit (HOSPITAL_BASED_OUTPATIENT_CLINIC_OR_DEPARTMENT_OTHER): Payer: Medicare Other | Admitting: Oncology

## 2012-05-26 ENCOUNTER — Ambulatory Visit
Admission: RE | Admit: 2012-05-26 | Discharge: 2012-05-26 | Disposition: A | Discharge status: Home / Self Care | Payer: Medicare Other | Source: Ambulatory Visit | Attending: Radiation Oncology | Admitting: Radiation Oncology

## 2012-05-26 VITALS — BP 96/65 | HR 99 | Temp 97.8°F | Resp 18 | Ht 67.0 in | Wt 135.1 lb

## 2012-05-26 DIAGNOSIS — C160 Malignant neoplasm of cardia: Secondary | ICD-10-CM

## 2012-05-26 DIAGNOSIS — C169 Malignant neoplasm of stomach, unspecified: Secondary | ICD-10-CM

## 2012-05-26 DIAGNOSIS — D509 Iron deficiency anemia, unspecified: Secondary | ICD-10-CM

## 2012-05-26 DIAGNOSIS — J449 Chronic obstructive pulmonary disease, unspecified: Secondary | ICD-10-CM

## 2012-05-26 DIAGNOSIS — R110 Nausea: Secondary | ICD-10-CM

## 2012-05-26 LAB — COMPREHENSIVE METABOLIC PANEL
ALT: 10 U/L (ref 0–53)
CO2: 27 mEq/L (ref 19–32)
Calcium: 9.1 mg/dL (ref 8.4–10.5)
Chloride: 100 mEq/L (ref 96–112)
Creatinine, Ser: 0.89 mg/dL (ref 0.50–1.35)
Glucose, Bld: 143 mg/dL — ABNORMAL HIGH (ref 70–99)
Total Protein: 6.1 g/dL (ref 6.0–8.3)

## 2012-05-26 LAB — CBC WITH DIFFERENTIAL/PLATELET
BASO%: 0.7 % (ref 0.0–2.0)
HCT: 34.8 % — ABNORMAL LOW (ref 38.4–49.9)
MCHC: 32.5 g/dL (ref 32.0–36.0)
MONO#: 0.4 10*3/uL (ref 0.1–0.9)
NEUT#: 3.2 10*3/uL (ref 1.5–6.5)
RBC: 4.55 10*6/uL (ref 4.20–5.82)
WBC: 3.9 10*3/uL — ABNORMAL LOW (ref 4.0–10.3)
lymph#: 0.2 10*3/uL — ABNORMAL LOW (ref 0.9–3.3)

## 2012-05-26 MED ORDER — HYDROCODONE-ACETAMINOPHEN 7.5-500 MG/15ML PO SOLN: 15.0000 mL | Freq: Four times a day (QID) | ORAL | Status: DC | PRN

## 2012-05-26 NOTE — Progress Notes (Signed)
  Radiation Oncology         [phone] ________________________________  Name: Danny Page MRN: [id number]  Date: 05/26/2012  DOB: 01/14/1935  Simulation Verification Note-Boost Field  Status: outpatient  NARRATIVE: The patient was brought to the treatment unit and placed in the planned treatment position. The clinical setup was verified. Then port films were obtained and uploaded to the radiation oncology medical record software.  The treatment beams were carefully compared against the planned radiation fields. The position location and shape of the radiation fields was reviewed. They targeted volume of tissue appears to be appropriately covered by the radiation beams. Organs at risk appear to be excluded as planned.  Based on my personal review, I approved the simulation verification. The patient's treatment will proceed as planned.  -----------------------------------  Billie Lade, PhD, MD

## 2012-05-26 NOTE — Progress Notes (Signed)
   [facility]    OFFICE PROGRESS NOTE   INTERVAL HISTORY:   He returns as scheduled. He continues to have pain in the xiphoid region. The pain is relieved with hydrocodone elixir. His chief complaint is nausea. He is able to tolerate liquids, but solid food causes nausea. He has intermittent emesis. The nausea is relieved with Ativan. No diarrhea, mouth sores, or hand/foot pain. He is scheduled to complete radiation on 05/30/2012.  Objective:  Vital signs in last 24 hours:  Blood pressure 96/65, pulse 99, temperature 97.8 F (36.6 C), temperature source Oral, resp. rate 18, height 5\' 7"  (1.702 m), weight 135 lb 1.6 oz (61.281 kg).    HEENT: No thrush or ulcers Resp: Lungs with good air movement bilaterally, and a few wheezes, no respiratory distress Cardio: Regular rate and rhythm GI: No hepatomegaly, nontender Vascular: No leg edema  Skin: Palms/soles without erythema     Lab Results:  Lab Results  Component Value Date   WBC 3.9* 05/26/2012   HGB 11.3* 05/26/2012   HCT 34.8* 05/26/2012   MCV 76.3* 05/26/2012   PLT 176 05/26/2012   ANC 3.2    Medications: I have reviewed the patient's current medications.  Assessment/Plan: 1. Adenocarcinoma of the stomach. Adenocarcinoma found in the gastric remnant status post previous partial gastrectomy/Billroth II anastomosis. No evidence of metastatic disease by physical exam or CT of the abdomen/pelvis. Staging EUS on 03/30/2012 confirmed a uT3 uN0 tumor. He began radiation and concurrent Xeloda chemotherapy on 04/18/2012. Xeloda was subsequently placed on hold due to chest pain. Xeloda was resumed on 04/26/2012 with no further chest pain. The Xeloda dose was titrated to 1000 mg twice daily beginning 04/28/2012. 2. Remote partial gastrectomy/Billroth II anastomosis secondary to ulcer disease. 3. Iron deficiency anemia secondary to the gastric mass status post red cell transfusion 03/17/2012. The hemoglobin is stable, he is  not taking 4. COPD. 5. History of colon polyps. 6. Chest pain/subxiphoid pain-most likely related to radiation esophagitis/gastritis or the gastric mass-he will continue hydrocodone as needed 7. Anorexia/weight loss-he is tolerating liquids, but continues to lose weight. I encouraged him to use nutrition supplements. We will arrange for a followup appointment with the cancer Center nutritionist.  Disposition:  He appears to be tolerating the Xeloda well. He is scheduled to complete Xeloda and radiation on 05/30/2012. He will return for an office and lab visit on 06/08/2012. We will schedule upon the with the cancer Center nutritionist for early next week.   Thornton Papas, MD  05/26/2012  12:21 PM

## 2012-05-29 ENCOUNTER — Ambulatory Visit
Admission: RE | Admit: 2012-05-29 | Discharge: 2012-05-29 | Disposition: A | Discharge status: Home / Self Care | Payer: Medicare Other | Source: Ambulatory Visit | Attending: Radiation Oncology | Admitting: Radiation Oncology

## 2012-05-30 ENCOUNTER — Ambulatory Visit
Admission: RE | Admit: 2012-05-30 | Discharge: 2012-05-30 | Disposition: A | Discharge status: Home / Self Care | Payer: Medicare Other | Source: Ambulatory Visit | Attending: Radiation Oncology | Admitting: Radiation Oncology

## 2012-05-30 ENCOUNTER — Ambulatory Visit: Payer: Medicare Other | Admitting: Nurse Practitioner

## 2012-05-30 ENCOUNTER — Ambulatory Visit: Payer: Medicare Other

## 2012-05-30 ENCOUNTER — Encounter: Payer: Self-pay | Admitting: Radiation Oncology

## 2012-05-30 VITALS — Wt 132.4 lb

## 2012-05-30 DIAGNOSIS — C169 Malignant neoplasm of stomach, unspecified: Secondary | ICD-10-CM

## 2012-05-30 NOTE — Addendum Note (Signed)
Encounter addended by: Amanda Pea, RN on: 05/30/2012 12:37 PM<BR>     Documentation filed: Visit Diagnoses

## 2012-05-30 NOTE — Progress Notes (Signed)
Weekly Management Note:  Site:stomach Current Dose:  5040  cGy Projected Dose: 5040  cGy  Narrative: The patient is seen today for routine under treatment assessment. CBCT/MVCT images/port films were reviewed. The chart was reviewed.   He still having difficulty with nausea vomiting, and had a bad episode this morning. He does have Zofran ODT which has been helpful. He finishes his Xeloda today. His weight is down 4 pounds over the past week. Blood counts are satisfactory. Radiation therapy completed today.  Physical Examination:  Wt Readings from Last 3 Encounters:  05/30/12 132 lb 6.4 oz (60.056 kg)  05/26/12 135 lb 1.6 oz (61.281 kg)  05/23/12 136 lb 3.2 oz (61.78 kg)   Temp Readings from Last 3 Encounters:  05/26/12 97.8 F (36.6 C) Oral  05/09/12 97.7 F (36.5 C)   05/02/12 97 F (36.1 C) Oral   BP Readings from Last 3 Encounters:  05/26/12 96/65  05/23/12 106/66  05/09/12 132/73   Pulse Readings from Last 3 Encounters:  05/26/12 99  05/23/12 87  05/09/12 76  .  Weight:  . Abdomen is soft.  Laboratory data; Lab Results  Component Value Date   WBC 3.9* 05/26/2012   HGB 11.3* 05/26/2012   HCT 34.8* 05/26/2012   MCV 76.3* 05/26/2012   PLT 176 05/26/2012    Impression: Tolerating radiation therapy well with the exception of nausea and vomiting which hopefully improve after completion of chemoradiation. Radiation therapy now completed.  Plan: Followup visit with Dr. Roselind Messier on September 4.

## 2012-05-30 NOTE — Progress Notes (Signed)
HERE TODAY FOR END OF TREAT ON ABDOMEN.  HAS HAD N/V AND ABD PAIN PRACTICALLY SINCE TX STARTED.  HAD NAUSEA AND VOMITED THIS AM BEFORE TX.  NOT EATING WELL AT ALL.  DR. Roselind Messier AWARE OF ALL OF THIS AND HAS BEEN ADDRESSING THE ISSUE AS BEST HE CAN.  HE HAS 2 MORE DAYS OF XELODA.

## 2012-05-31 ENCOUNTER — Ambulatory Visit: Payer: Medicare Other

## 2012-06-01 ENCOUNTER — Telehealth: Payer: Self-pay | Admitting: Nutrition

## 2012-06-01 ENCOUNTER — Other Ambulatory Visit: Payer: Self-pay | Admitting: *Deleted

## 2012-06-01 ENCOUNTER — Telehealth: Payer: Self-pay | Admitting: *Deleted

## 2012-06-01 NOTE — Telephone Encounter (Signed)
RN requested nutrition follow up for continued weight loss related to nausea and vomiting.  I called patient and left a message for him to return my call for phone follow up or to make an appointment.

## 2012-06-01 NOTE — Telephone Encounter (Signed)
I have called patient 2 additional times this afternoon and could not reach him.  I have left another message and will try to call on Friday, 06-02-12.

## 2012-06-01 NOTE — Telephone Encounter (Signed)
Left voice message to inform the patient of the new date and time on 06-08-2012

## 2012-06-02 ENCOUNTER — Telehealth: Payer: Self-pay | Admitting: *Deleted

## 2012-06-02 NOTE — Telephone Encounter (Signed)
Spoke with patient's wife by phone on 06/01/12.  This RN informed the patient's wife that the dietician has been trying to contact them by phone.  The patient's wife stated she would be expecting to receive call by phone to obtain eating hints for her husband.  The patient's wife also stated the patient has been having pain, but that he has not been taking pain medication as prescribed.  This RN encouraged that the patient take pain medication as prescribed to see if it helps and to call if it is not working.  Patient's wife verbalized understanding.

## 2012-06-03 NOTE — Progress Notes (Signed)
  Radiation Oncology         [phone] ________________________________  Name: Danny Page MRN: [id number]  Date: 04/18/2012  DOB: 12-14-1934  Simulation Verification Note  Status:   outpatient  NARRATIVE: The patient was brought to the treatment unit and placed in the planned treatment position. The clinical setup was verified. Then port films were obtained and uploaded to the radiation oncology medical record software.  The treatment beams were carefully compared against the planned radiation fields. The position location and shape of the radiation fields was reviewed. They targeted volume of tissue appears to be appropriately covered by the radiation beams. Organs at risk appear to be excluded as planned.  Based on my personal review, I approved the simulation verification. The patient's treatment will proceed as planned.  ------------------------------------------------  Radene Gunning, MD, PhD

## 2012-06-03 NOTE — Progress Notes (Incomplete)
°  Radiation Oncology         [phone] ________________________________  Name: Danny Page MRN: [id number]  Date: 05/30/2012  DOB: 02-12-1935  End of Treatment Note  Diagnosis:   The encounter diagnosis was Gastric cancer  Indication for treatment:  ***       Radiation treatment dates:   04/19/2012-05/30/2012  Site/dose:   ***  Beams/energy:   ***  Narrative: The patient tolerated radiation treatment relatively well.   ***  Plan: The patient has completed radiation treatment. The patient will return to radiation oncology clinic for routine followup in one month. I advised them to call or return sooner if they have any questions or concerns related to their recovery or treatment.  -----------------------------------  Billie Lade, PhD, MD

## 2012-06-05 ENCOUNTER — Telehealth: Payer: Self-pay | Admitting: Oncology

## 2012-06-05 ENCOUNTER — Telehealth: Payer: Self-pay | Admitting: *Deleted

## 2012-06-05 ENCOUNTER — Ambulatory Visit: Payer: Medicare Other | Admitting: Nutrition

## 2012-06-05 DIAGNOSIS — C169 Malignant neoplasm of stomach, unspecified: Secondary | ICD-10-CM

## 2012-06-05 MED ORDER — LORAZEPAM 0.5 MG PO TABS: 0.5000 mg | ORAL_TABLET | Freq: Three times a day (TID) | ORAL | Status: DC | PRN

## 2012-06-05 NOTE — Progress Notes (Signed)
I called Danny Page on Danny telephone today and spoke to both Page and wife.  Danny Page's wife reports Danny Page is eating Jell-O and pudding and some ginger ale.  Other than that he is refusing all nutritional supplements.  He is reporting pain when food reaches his stomach.  He had vomiting this morning.  He has had progressive weight loss and his weight on August 6 was documented as 132.4 pounds.  NUTRITION DIAGNOSIS:  Food and nutrition related knowledge deficit continues.  New diagnosis of inadequate oral intake related to pain, nausea and vomiting, cancer treatments as evidenced by 12% weight loss from usual body weight 2-1/2 months ago.  INTERVENTION:  I have educated Page and wife on Danny importance of increasing oral intake utilizing foods Page tolerates.  I have given them specific suggestions for ways to add calories and protein using foods that he has previously been able to tolerate.  I will be providing some additional nutrition supplement samples for Page to try. I have encouraged and educated Danny Page to do Danny best he can to increase his oral intake and educated him on Danny importance of increased intake for healing.  Danny Page and wife are appreciative.  MONITORING/EVALUATION/GOALS:  Danny Page will be able to increase his oral intake to minimize further weight loss and promote healing.  NEXT VISIT:  Tuesday, August 20 after Dr. Kalman Drape appointment.   ______________________________ Zenovia Jarred, RD, CSO, LDN Clinical Nutrition Specialist BN/MEDQ  D:  06/05/2012  T:  06/05/2012  Job:  [id number]

## 2012-06-05 NOTE — Telephone Encounter (Signed)
COMPLETED XELODA AND RADIATION ON 05/30/12. PT. IS HAVING STABBING PAIN AT A NINE TO TEN IN THE UPPER ABDOMEN PARTICULARLY WHEN HE EATS. HIS PAIN MEDICATION LOWERS THE PAIN TO A THREE BUT NEEDS MORE MEDICATION IN SIX HOURS. PT.'S INTAKE HAS BEEN JELLO AND LIQUIDS. THE PAST 24 HOURS FLUID INTAKE OF 36 OUNCES. PT. HAS VOMITED X 3 IN THE PAST 24 HOURS. NAUSEA MEDICATION DOES NOT MUCH. THIS NOTE TO DR.SHERRILL.

## 2012-06-05 NOTE — Telephone Encounter (Signed)
Called wife back and instructed her that Dr. Truett Perna said he can take his pain med every 4 hours as needed and can add Ativan 0.5 mg sublingual every 8 hours for nausea. Reminded her he is still having some of the side effects of the treatment and it will take time for him to heal. She also reports that the dietician gave her some good advice today she will try.  Told her OK to use Ativan and Compazine, but to space them out 3-4 hours and to watch for sedation and increase in fall risk.

## 2012-06-05 NOTE — Telephone Encounter (Signed)
Pt's wife called and moved 8/15 appt to 8/20 due to she cannot bring pt on 8/15.

## 2012-06-08 ENCOUNTER — Ambulatory Visit: Payer: Medicare Other | Admitting: Oncology

## 2012-06-08 ENCOUNTER — Other Ambulatory Visit: Payer: Medicare Other

## 2012-06-09 ENCOUNTER — Other Ambulatory Visit: Payer: Self-pay | Admitting: *Deleted

## 2012-06-09 ENCOUNTER — Telehealth: Payer: Self-pay | Admitting: *Deleted

## 2012-06-09 DIAGNOSIS — C169 Malignant neoplasm of stomach, unspecified: Secondary | ICD-10-CM

## 2012-06-09 MED ORDER — MEGESTROL ACETATE 40 MG/ML PO SUSP: 200.0000 mg | Freq: Two times a day (BID) | ORAL | Status: AC

## 2012-06-09 NOTE — Telephone Encounter (Signed)
Wife calls wondering if anything can be done to help her husbands appetite.  He is using ativan for nausea and anxiety.  But she is wondering if there is something else he can take to improve his appetite.  They use [facility] in [place].  Will discuss with Kem Boroughs RN.  Call wife back on her cell [phone].

## 2012-06-09 NOTE — Telephone Encounter (Signed)
Wife called and left VM at 1615 and 1621 expressing displeasure that pharmacy has told her that Megace was not called in. Called [facility] at 1730 to check on script and confirmed it was received and picked up by wife.

## 2012-06-09 NOTE — Telephone Encounter (Signed)
Left VM on wife's cell phone that script for Megace has been sent to [facility] pharmacy to try. Instructed her to use 5 cc measuring device and not household teaspoon.

## 2012-06-11 NOTE — Progress Notes (Signed)
   Department of Radiation Oncology  Phone:  [phone] Fax:        [phone]   Simulation note:  On 05/25/2012 Danny Page underwent additional planning for radiation therapy directed at the gastric region.  The patient's treatment planning CT scan was reviewed he  had set up of a 4 custom shaped  fields using a LAO RPO right lateral and left lateral beam arrangement. 15 MV photons will be used for the patient's treatment. A computerized isodose plan will be generated for treatment. In addition cumulative dose volume histograms of the target area as well as critical structures will be assessed.

## 2012-06-11 NOTE — Progress Notes (Signed)
  Radiation Oncology         [phone] ________________________________  Name: Danny Page MRN: [id number]  Date: 05/30/2012  DOB: 1935/08/03  End of Treatment Note  Diagnosis:   Gastric cancer     Indication for treatment:  Definitive treatment along with radiosensitizing chemotherapy       Radiation treatment dates:   June 26 through 05/30/2012  Site/dose:   Gastric and perigastric region 4500 cGy in 25 fractions. The site of presentation within the gastric region was boosted further to a cumulative dose of 5040 cGy.  Beams/energy:   4 conformally shaped fields using a RPO LAO right lateral and left lateral beam arrangement. 15 MV photons were used for the patient's treatment. The patient's boost field also was using a RPO LAO right lateral and left lateral beam arrangement using 15 MV photons.  Narrative: The patient tolerated radiation treatment relatively well.   He did have significant nausea as expected with this treatment. In light of this the patient's appetite was poor and his by mouth intake was suboptimal. Patient lost approximately 18 pounds during the course of his therapy.    Radiosensitizing chemotherapy: Xeloda  Plan: The patient has completed radiation treatment. The patient will return to radiation oncology clinic for routine followup in one month. I advised them to call or return sooner if they have any questions or concerns related to their recovery or treatment.  -----------------------------------  Billie Lade, PhD, MD

## 2012-06-12 ENCOUNTER — Telehealth: Payer: Self-pay | Admitting: Nutrition

## 2012-06-12 NOTE — Telephone Encounter (Signed)
Returned wife's phone call.  She left a message that she felt patient "acts depressed, talks about death a lot, and seems almost delirious" at times.  She feels like this is part of why he won't eat.  Had to leave a message.  I have a follow up appointment with patient tomorrow.  Notified Dr. Truett Perna, Vicie Mutters, and Tamala Julian of wife's phone message for follow up as needed.

## 2012-06-13 ENCOUNTER — Ambulatory Visit (HOSPITAL_BASED_OUTPATIENT_CLINIC_OR_DEPARTMENT_OTHER): Payer: Medicare Other | Admitting: Oncology

## 2012-06-13 ENCOUNTER — Encounter: Payer: Self-pay | Admitting: *Deleted

## 2012-06-13 ENCOUNTER — Ambulatory Visit: Payer: Medicare Other | Admitting: Nutrition

## 2012-06-13 ENCOUNTER — Other Ambulatory Visit (HOSPITAL_BASED_OUTPATIENT_CLINIC_OR_DEPARTMENT_OTHER): Payer: Medicare Other

## 2012-06-13 ENCOUNTER — Telehealth: Payer: Self-pay | Admitting: Oncology

## 2012-06-13 VITALS — BP 108/70 | HR 95 | Temp 96.7°F | Resp 20 | Ht 67.0 in | Wt 124.6 lb

## 2012-06-13 DIAGNOSIS — C160 Malignant neoplasm of cardia: Secondary | ICD-10-CM

## 2012-06-13 DIAGNOSIS — R112 Nausea with vomiting, unspecified: Secondary | ICD-10-CM

## 2012-06-13 DIAGNOSIS — C169 Malignant neoplasm of stomach, unspecified: Secondary | ICD-10-CM

## 2012-06-13 DIAGNOSIS — F411 Generalized anxiety disorder: Secondary | ICD-10-CM

## 2012-06-13 DIAGNOSIS — R634 Abnormal weight loss: Secondary | ICD-10-CM

## 2012-06-13 LAB — CBC WITH DIFFERENTIAL/PLATELET
BASO%: 0.7 % (ref 0.0–2.0)
Basophils Absolute: 0.1 10*3/uL (ref 0.0–0.1)
Eosinophils Absolute: 0.1 10*3/uL (ref 0.0–0.5)
HCT: 36 % — ABNORMAL LOW (ref 38.4–49.9)
HGB: 12 g/dL — ABNORMAL LOW (ref 13.0–17.1)
MONO#: 0.7 10*3/uL (ref 0.1–0.9)
NEUT%: 56.8 % (ref 39.0–75.0)
WBC: 7.5 10*3/uL (ref 4.0–10.3)
lymph#: 2.4 10*3/uL (ref 0.9–3.3)

## 2012-06-13 MED ORDER — MIRTAZAPINE 15 MG PO TABS: 15.0000 mg | ORAL_TABLET | Freq: Every day | ORAL | Status: DC

## 2012-06-13 NOTE — Progress Notes (Signed)
   [facility]    OFFICE PROGRESS NOTE   INTERVAL HISTORY:   He returns as scheduled. He continues to have discomfort in the subxiphoid region. The pain is relieved with hydrocodone. He complains of intermittent nausea and vomiting over the past few weeks. He can tolerate liquids. He has a poor appetite. He feels "dizzy ". No fever or shortness of breath. Good bowel and bladder function. He is drinking a lot of liquids. Ms. Smolinsky reports he is refusing many foods. He has been depressed.  He reports feeling much better for the past 2 days. No further vomiting.  Objective:  Vital signs in last 24 hours:  Blood pressure 108/70, pulse 95, temperature 96.7 F (35.9 C), temperature source Oral, resp. rate 20, height 5\' 7"  (1.702 m), weight 124 lb 9.6 oz (56.518 kg).    HEENT: The mucous membranes are moist, no thrush or ulcers, whitecoat over the tongue Resp: Lungs clear bilaterally Cardio: Regular rate and rhythm GI: The abdomen is soft and nontender, no hepatomegaly, no mass Vascular: No leg edema Neuro: Alert and oriented  Skin: Mild decrease in skin turgor    Lab Results:  Lab Results  Component Value Date   WBC 7.5 06/13/2012   HGB 12.0* 06/13/2012   HCT 36.0* 06/13/2012   MCV 74.7* 06/13/2012   PLT 148 06/13/2012   ANC 4.3    Medications: I have reviewed the patient's current medications.  Assessment/Plan: 1. Adenocarcinoma of the stomach. Adenocarcinoma found in the gastric remnant status post previous partial gastrectomy/Billroth II anastomosis. No evidence of metastatic disease by physical exam or CT of the abdomen/pelvis. Staging EUS on 03/30/2012 confirmed a uT3 uN0 tumor. He began radiation and concurrent Xeloda chemotherapy on 04/18/2012. Xeloda was subsequently placed on hold due to chest pain. Xeloda was resumed on 04/26/2012 with no further chest pain. The Xeloda dose was titrated to 1000 mg twice daily beginning 04/28/2012. He completed treatment on  05/30/2012. 2. Remote partial gastrectomy/Billroth II anastomosis secondary to ulcer disease. 3. Iron deficiency anemia secondary to the gastric mass status post red cell transfusion 03/17/2012. The hemoglobin is stable, he is not taking iron 4. COPD. 5. History of colon polyps. 6. Chest pain/subxiphoid pain-most likely related to radiation esophagitis/gastritis or the gastric mass-he will continue hydrocodone as needed 7. Anorexia/weight loss-he is tolerating liquids, but continues to lose weight. I again encouraged him to use nutrition supplements. He will see the cancer Center nutritionist today. 8. Depression-he will begin a trial of Remeron.  Disposition:  He is now several weeks out from completing Xeloda and radiation. I am hopeful the subxiphoid pain will resolve. I encouraged him to use nutrition supplements. He will meet with the cancer Center nutritionist today. He will begin a trial of Remeron area and Mr. Kinney is scheduled to meet with the cancer Center nutritionist and social worker today. He will return for an office visit in one week.   Thornton Papas, MD  06/13/2012  1:59 PM

## 2012-06-13 NOTE — Progress Notes (Signed)
[facility] Brief Psychosocial Assessment Clinical Social Work  Clinical Social Work was referred by patient navigator for assessment of psychosocial needs.  Clinical Social Worker met with patient and patients wife at [facility] to offer support and assess for needs.  Pt's wife previously reported concerns for pt's emotional health, stating he has expressed some depressed feelings.  CSW offered pt and pt's wife support, and informed them of the support services and support team members at [facility]  When asked how pt was feeling or about any current needs, neither pt nor wife reported any concerns.  Pt was very quite during the meeting and communicated only by nodding his head.  CSW informed pt of the [facility] and information available.  CSW encouraged pt and pt's wife to call with any question or concerns.         Tamala Julian, MSW, LCSW Clinical Social Worker [facility] [phone]

## 2012-06-13 NOTE — Progress Notes (Signed)
I spoke with both Mr. and Mrs. Hanigan today.  The patient reports that he is going to try to eat a little bit better.  In fact, he verbalizes wanting to eat a steak tonight.  Wife reports vomiting has improved and he has not thrown up for the past 2 days, which is an improvement over vomiting twice a day for the past several weeks.  He has continued to have weight loss and his weight was documented as 124.6 pounds which is about an 8-pound weight loss over the last 2 weeks.  Oral intake continues to consist of Jell-O or pudding.  The patient is really not eating much of anything else at this time.  He is refusing all nutrition supplements including those used in recipes.  NUTRITION DIAGNOSIS:  Food and nutrition-related knowledge deficit continues.  Diagnosis of inadequate oral intake continues.  INTERVENTION:  I have encouraged the patient to attempt to increase bland diet to include soft, easy to tolerate protein foods along with some creamed potatoes and rice.  I have discouraged spicy or greasy foods.  I have asked the patient to try to eat every 2-3 hours even if it is only 2 bites or sips.  The patient is agreeable.  MONITORING/EVALUATION/GOALS:  The patient will increase oral intake to minimize further weight loss.  NEXT VISIT:  There is no followup scheduled.  However, the patient's wife will call me with questions or concerns.   ______________________________ Zenovia Jarred, RD, CSO, LDN Clinical Nutrition Specialist BN/MEDQ  D:  06/13/2012  T:  06/13/2012  Job:  [id number]

## 2012-06-13 NOTE — Progress Notes (Signed)
Met with patient and wife to assess for needs.  Patient has been depressed and has lost weight.  Poor appetite.  They are meeting with nutritionist and SW today.  Emotional support given.  Encouraged them to call for asist as needed.   Will continue to follow.

## 2012-06-13 NOTE — Telephone Encounter (Signed)
gve the pt his aug 2013 appt calendar °

## 2012-06-16 ENCOUNTER — Telehealth: Payer: Self-pay | Admitting: *Deleted

## 2012-06-16 NOTE — Telephone Encounter (Signed)
Called patient to inform of fu appt. Being moved to 07-03-12 at 4:30 p.m., lvm for a return call

## 2012-06-20 ENCOUNTER — Ambulatory Visit (HOSPITAL_BASED_OUTPATIENT_CLINIC_OR_DEPARTMENT_OTHER): Payer: Medicare Other | Admitting: Oncology

## 2012-06-20 ENCOUNTER — Ambulatory Visit (HOSPITAL_COMMUNITY)
Admission: RE | Admit: 2012-06-20 | Discharge: 2012-06-20 | Disposition: A | Discharge status: Home / Self Care | Payer: Medicare Other | Source: Ambulatory Visit | Attending: Oncology | Admitting: Oncology

## 2012-06-20 VITALS — BP 105/70 | HR 109 | Temp 100.4°F | Resp 22 | Ht 67.0 in | Wt 124.9 lb

## 2012-06-20 DIAGNOSIS — R0609 Other forms of dyspnea: Secondary | ICD-10-CM

## 2012-06-20 DIAGNOSIS — C169 Malignant neoplasm of stomach, unspecified: Secondary | ICD-10-CM

## 2012-06-20 DIAGNOSIS — R0989 Other specified symptoms and signs involving the circulatory and respiratory systems: Secondary | ICD-10-CM

## 2012-06-20 DIAGNOSIS — R5381 Other malaise: Secondary | ICD-10-CM

## 2012-06-20 DIAGNOSIS — Z85028 Personal history of other malignant neoplasm of stomach: Secondary | ICD-10-CM | POA: Insufficient documentation

## 2012-06-20 DIAGNOSIS — J4489 Other specified chronic obstructive pulmonary disease: Secondary | ICD-10-CM | POA: Insufficient documentation

## 2012-06-20 DIAGNOSIS — C160 Malignant neoplasm of cardia: Secondary | ICD-10-CM

## 2012-06-20 DIAGNOSIS — R509 Fever, unspecified: Secondary | ICD-10-CM

## 2012-06-20 DIAGNOSIS — J449 Chronic obstructive pulmonary disease, unspecified: Secondary | ICD-10-CM | POA: Insufficient documentation

## 2012-06-20 MED ORDER — PREDNISONE 10 MG PO TABS: ORAL_TABLET | ORAL | Status: DC

## 2012-06-20 NOTE — Progress Notes (Signed)
   [facility]    OFFICE PROGRESS NOTE   INTERVAL HISTORY:   He returns as scheduled. He no longer has nausea or chest pain. He complains of malaise and exertional dyspnea. He feels "hot and cold ". Mild cough.  Objective:  Vital signs in last 24 hours:  Blood pressure 105/70, pulse 109, temperature 100.4 F (38 C), temperature source Oral, resp. rate 22, height 5\' 7"  (1.702 m), weight 124 lb 14.4 oz (56.654 kg). oxygen saturation 96% on room air    HEENT: No thrush or ulcer Resp: end inspiratory rhonchi at the posterior bases bilaterally, dyspnea when walking in the [facility]: Regular rate and rhythm GI: Nontender, no hepatomegaly, no mass Vascular: No leg edema   Lab Results:  Lab Results  Component Value Date   WBC 7.5 06/13/2012   HGB 12.0* 06/13/2012   HCT 36.0* 06/13/2012   MCV 74.7* 06/13/2012   PLT 148 06/13/2012    X-ray-a chest x-ray on 06/20/2012 reveals changes of emphysema. The lungs are clear.  Medications: I have reviewed the patient's current medications.  Assessment/Plan: 1. Adenocarcinoma of the stomach. Adenocarcinoma found in the gastric remnant status post previous partial gastrectomy/Billroth II anastomosis. No evidence of metastatic disease by physical exam or CT of the abdomen/pelvis. Staging EUS on 03/30/2012 confirmed a uT3 uN0 tumor. He began radiation and concurrent Xeloda chemotherapy on 04/18/2012. Xeloda was subsequently placed on hold due to chest pain. Xeloda was resumed on 04/26/2012 with no further chest pain. The Xeloda dose was titrated to 1000 mg twice daily beginning 04/28/2012. He completed treatment on 05/30/2012. 2. Remote partial gastrectomy/Billroth II anastomosis secondary to ulcer disease. 3. Iron deficiency anemia secondary to the gastric mass status post red cell transfusion 03/17/2012. The hemoglobin is stable, he is not taking iron 4. COPD. 5. History of colon polyps. 6. Chest pain/subxiphoid pain-most likely  related to radiation esophagitis/gastritis or the gastric mass-he denied pain today  7. Anorexia/weight loss-his weight is stable today. 8. Depression-he started a trial of Remeron on 06/13/2012 9. Dyspnea/low-grade fever-? COPD flare,? Radiation pneumonitis-he will begin a trial of prednisone  Disposition:  He continues to have malaise. I doubt his current symptoms are related directly to GI toxicity from the Xeloda and radiation. He has a low-grade fever and exertional dyspnea today. He became dyspneic and tachycardic when ambulating in the hall today. I discussed the case with Dr. Roselind Messier. He could have radiation pneumonitis. He will begin a trial of prednisone.  Mr.Rauda will return for an office visit in one week.     Thornton Papas, MD  06/20/2012  4:46 PM

## 2012-06-20 NOTE — Patient Instructions (Addendum)
Danny Page [birth year] [id number]  [facility] Discharge Instructions  Your exam findings, labs and results were discussed with your MD today.  Filed Vitals:   06/20/12 1607  BP: 105/70  Pulse: 109  Temp: 100.4 F (38 C)  Resp: 22   Current outpatient prescriptions:ferrous gluconate (FERGON) 325 MG tablet, Take 325 mg by mouth daily with breakfast., Disp: , Rfl: ;  megestrol (MEGACE ORAL) 40 MG/ML suspension, Take 5 mLs (200 mg total) by mouth 2 (two) times daily., Disp: 300 mL, Rfl: 1;  mirtazapine (REMERON) 15 MG tablet, Take 1 tablet (15 mg total) by mouth at bedtime., Disp: 30 tablet, Rfl: 1 pantoprazole (PROTONIX) 40 MG tablet, Take 1 tablet (40 mg total) by mouth 2 (two) times daily., Disp: 60 tablet, Rfl: 0;  sucralfate (CARAFATE) 1 G tablet, Take 1 g by mouth 4 (four) times daily., Disp: , Rfl: ;  HYDROcodone-acetaminophen (LORTAB) 7.5-500 MG/15ML solution, Take 15 mLs by mouth every 6 (six) hours as needed for pain., Disp: 200 mL, Rfl: 0 LORazepam (ATIVAN) 0.5 MG tablet, Place 1 tablet (0.5 mg total) under the tongue every 8 (eight) hours as needed. As needed for nausea, Disp: 60 tablet, Rfl: 0;  predniSONE (DELTASONE) 10 MG tablet, Take 4 tablets daily for 3 days then 3 tabs daily for 3 days then 2 tabs daily for 3 days then 1 tab daily for three days then stop., Disp: 40 tablet, Rfl: 0 prochlorperazine (COMPAZINE) 10 MG tablet, Take 1 tablet (10 mg total) by mouth every 6 (six) hours as needed., Disp: 60 tablet, Rfl: 1;  SPIRIVA HANDIHALER 18 MCG inhalation capsule, INHALE ONE DOSE EVERY DAY, Disp: 30 each, Rfl: 11  Please visit scheduling to obtain calendar for future appointments.  Please call the [facility] at [phone] during business hours should you have any further questions or need assistance in obtaining follow-up care. If you have a medical emergency, please dial 911.  Special Instructions:

## 2012-06-21 ENCOUNTER — Telehealth: Payer: Self-pay | Admitting: Oncology

## 2012-06-21 NOTE — Telephone Encounter (Signed)
lmonvm for pt's wife (cell) re appt for 9/3. Also called home phone but was not able to reach pt/wife or lm. Pt did have cxr yesterday evening.

## 2012-06-27 ENCOUNTER — Telehealth: Payer: Self-pay | Admitting: Oncology

## 2012-06-27 ENCOUNTER — Ambulatory Visit (HOSPITAL_BASED_OUTPATIENT_CLINIC_OR_DEPARTMENT_OTHER): Payer: Medicare Other | Admitting: Oncology

## 2012-06-27 VITALS — BP 108/68 | HR 109 | Temp 98.4°F | Resp 18 | Ht 67.0 in | Wt 117.5 lb

## 2012-06-27 DIAGNOSIS — D509 Iron deficiency anemia, unspecified: Secondary | ICD-10-CM

## 2012-06-27 DIAGNOSIS — F329 Major depressive disorder, single episode, unspecified: Secondary | ICD-10-CM

## 2012-06-27 DIAGNOSIS — J189 Pneumonia, unspecified organism: Secondary | ICD-10-CM

## 2012-06-27 DIAGNOSIS — C169 Malignant neoplasm of stomach, unspecified: Secondary | ICD-10-CM

## 2012-06-27 NOTE — Patient Instructions (Signed)
Danny Page 06-05-1935 [id number]  [facility] Discharge Instructions  Your exam findings, labs and results were discussed with your MD today.  Filed Vitals:   06/27/12 1556  BP: 108/68  Pulse: 109  Temp: 98.4 F (36.9 C)  Resp: 18   Current outpatient prescriptions:ferrous gluconate (FERGON) 325 MG tablet, Take 325 mg by mouth daily with breakfast., Disp: , Rfl: ;  megestrol (MEGACE ORAL) 40 MG/ML suspension, Take 5 mLs (200 mg total) by mouth 2 (two) times daily., Disp: 300 mL, Rfl: 1;  mirtazapine (REMERON) 15 MG tablet, Take 1 tablet (15 mg total) by mouth at bedtime., Disp: 30 tablet, Rfl: 1 predniSONE (DELTASONE) 10 MG tablet, Take 4 tablets daily for 3 days then 3 tabs daily for 3 days then 2 tabs daily for 3 days then 1 tab daily for three days then stop., Disp: 40 tablet, Rfl: 0;  SPIRIVA HANDIHALER 18 MCG inhalation capsule, INHALE ONE DOSE EVERY DAY, Disp: 30 each, Rfl: 11;  sucralfate (CARAFATE) 1 G tablet, Take 1 g by mouth 4 (four) times daily. 10ml by mouth with meals, Disp: , Rfl:  HYDROcodone-acetaminophen (LORTAB) 7.5-500 MG/15ML solution, Take 15 mLs by mouth every 6 (six) hours as needed for pain., Disp: 200 mL, Rfl: 0;  LORazepam (ATIVAN) 0.5 MG tablet, Place 1 tablet (0.5 mg total) under the tongue every 8 (eight) hours as needed. As needed for nausea, Disp: 60 tablet, Rfl: 0;  pantoprazole (PROTONIX) 40 MG tablet, Take 1 tablet (40 mg total) by mouth 2 (two) times daily., Disp: 60 tablet, Rfl: 0 prochlorperazine (COMPAZINE) 10 MG tablet, Take 1 tablet (10 mg total) by mouth every 6 (six) hours as needed., Disp: 60 tablet, Rfl: 1  Please visit scheduling to obtain calendar for future appointments.  Please call the [facility] at [phone] during business hours should you have any further questions or need assistance in obtaining follow-up care. If you have a medical emergency, please dial 911.  Special Instructions:

## 2012-06-27 NOTE — Progress Notes (Signed)
   [facility]    OFFICE PROGRESS NOTE   INTERVAL HISTORY:   He returns as scheduled. No fever. The cough has improved since he started prednisone last week. His appetite and energy level has also improved, though he continues to have a limited oral intake. He is now trying different kinds of food including chicken, noodles, and corn. The exertional dyspnea is partially improved. No pain or nausea. He plans on going fishing.  Objective:  Vital signs in last 24 hours:  Blood pressure 108/68, pulse 109, temperature 98.4 F (36.9 C), temperature source Oral, resp. rate 18, height 5\' 7"  (1.702 m), weight 117 lb 8 oz (53.298 kg).    Resp: Distant breath sounds with and inspiratory rhonchi at the posterior base bilaterally, no respiratory distress Cardio: Regular rate and rhythm GI: No hepatosplenomegaly, nontender Vascular: No leg edema   Lab Results:  Lab Results  Component Value Date   WBC 7.5 06/13/2012   HGB 12.0* 06/13/2012   HCT 36.0* 06/13/2012   MCV 74.7* 06/13/2012   PLT 148 06/13/2012      Medications: I have reviewed the patient's current medications.  Assessment/Plan: 1. Adenocarcinoma of the stomach. Adenocarcinoma found in the gastric remnant status post previous partial gastrectomy/Billroth II anastomosis. No evidence of metastatic disease by physical exam or CT of the abdomen/pelvis. Staging EUS on 03/30/2012 confirmed a uT3 uN0 tumor. He began radiation and concurrent Xeloda chemotherapy on 04/18/2012. Xeloda was subsequently placed on hold due to chest pain. Xeloda was resumed on 04/26/2012 with no further chest pain. The Xeloda dose was titrated to 1000 mg twice daily beginning 04/28/2012. He completed treatment on 05/30/2012. 2. Remote partial gastrectomy/Billroth II anastomosis secondary to ulcer disease. 3. Iron deficiency anemia secondary to the gastric mass status post red cell transfusion 03/17/2012. The hemoglobin is stable, he is not taking  iron 4. COPD. 5. History of colon polyps. 6. Chest pain/subxiphoid pain-most likely related to radiation esophagitis/gastritis or the gastric mass-he denied pain today  7. Anorexia/weight loss-his weight is down again today, but his appetite has improved 8. Depression-he started a trial of Remeron on 06/13/2012 9. Dyspnea/low-grade fever-? COPD flare,? Radiation pneumonitis-he is completing a trial of prednisone. The symptoms have partially improved.  Disposition:  His overall performance status appears improved today. He is trying different kinds of foods and appears interested in getting out of the house. He will complete the prednisone taper over the next week. He will return for an office visit in 2 weeks.  Mr. Allerton will contact us in the interim for new symptoms.   Thornton Papas, MD  06/27/2012  4:52 PM

## 2012-06-27 NOTE — Telephone Encounter (Signed)
lmonvm adviisng the pt of his next f/u appt in sept.

## 2012-06-28 ENCOUNTER — Ambulatory Visit: Payer: Medicare Other | Admitting: Radiation Oncology

## 2012-07-03 ENCOUNTER — Ambulatory Visit: Payer: Medicare Other | Admitting: Radiation Oncology

## 2012-07-13 ENCOUNTER — Ambulatory Visit (HOSPITAL_BASED_OUTPATIENT_CLINIC_OR_DEPARTMENT_OTHER): Payer: Medicare Other | Admitting: Oncology

## 2012-07-13 VITALS — BP 94/68 | HR 116 | Temp 97.0°F | Resp 18 | Ht 67.0 in | Wt 117.0 lb

## 2012-07-13 DIAGNOSIS — C169 Malignant neoplasm of stomach, unspecified: Secondary | ICD-10-CM

## 2012-07-13 DIAGNOSIS — C160 Malignant neoplasm of cardia: Secondary | ICD-10-CM

## 2012-07-13 DIAGNOSIS — R079 Chest pain, unspecified: Secondary | ICD-10-CM

## 2012-07-13 DIAGNOSIS — D508 Other iron deficiency anemias: Secondary | ICD-10-CM

## 2012-07-13 DIAGNOSIS — R0989 Other specified symptoms and signs involving the circulatory and respiratory systems: Secondary | ICD-10-CM

## 2012-07-13 NOTE — Progress Notes (Signed)
   [facility]    OFFICE PROGRESS NOTE   INTERVAL HISTORY:   She returns as scheduled. His appetite remains poor. He reports an episode of "dizziness "last week and he occasionally feels dizzy when standing. No pain or nausea. He has exertional dyspnea. The only medication he is taking is a Spiriva inhaler. Objective:  Vital signs in last 24 hours:  Blood pressure 81/53, pulse 110, temperature 97 F (36.1 C), temperature source Oral, resp. rate 18, height 5\' 7"  (1.702 m), weight 117 lb (53.071 kg). repeat manual blood pressure 110/76 with a pulse of 108 supine, 94/68 with a pulse 116 standing    HEENT: Mild whitecoat over the tongue, no buccal thrush, the mucous membranes are moist Resp: Decreased breath sounds with coarse inspiratory rhonchi at the posterior base bilaterally, no respiratory distress Cardio: Regular rate and rhythm GI: No hepatomegaly, nontender Vascular: No leg edema  Skin: The skin turgor is diminished, resolving radiation hyperpigmentation over the back      Medications: I have reviewed the patient's current medications.  Assessment/Plan: 1. Adenocarcinoma of the stomach. Adenocarcinoma found in the gastric remnant status post previous partial gastrectomy/Billroth II anastomosis. No evidence of metastatic disease by physical exam or CT of the abdomen/pelvis. Staging EUS on 03/30/2012 confirmed a uT3 uN0 tumor. He began radiation and concurrent Xeloda chemotherapy on 04/18/2012. Xeloda was subsequently placed on hold due to chest pain. Xeloda was resumed on 04/26/2012 with no further chest pain. The Xeloda dose was titrated to 1000 mg twice daily beginning 04/28/2012. He completed treatment on 05/30/2012. 2. Remote partial gastrectomy/Billroth II anastomosis secondary to ulcer disease. 3. Iron deficiency anemia secondary to the gastric mass status post red cell transfusion 03/17/2012. The hemoglobin is stable, he is not taking  iron 4. COPD. 5. History of colon polyps. 6. Chest pain/subxiphoid pain-most likely related to radiation esophagitis/gastritis or the gastric mass-he denied pain today  7. Anorexia/weight loss-his weight is stable today 8. Depression-he started a trial of Remeron on 06/13/2012, no longer taking Remeron 9. Dyspnea/low-grade fever at an office visit on 06/20/2012-? COPD flare,? Radiation pneumonitis-he is completed a trial of prednisone. The symptoms have partially improved.    Disposition:  He remains weak. Mild orthostatic blood pressure changes today. I encouraged him to increase his fluid intake. He will return for a chemistry panel, TSH, and cortisol level on 07/17/2012. He is scheduled for an office visit on 07/25/2012.  We will consider a repeat upper endoscopy and restaging CT evaluation if the weakness persists.   Thornton Papas, MD  07/13/2012  4:43 PM

## 2012-07-14 ENCOUNTER — Telehealth: Payer: Self-pay | Admitting: Oncology

## 2012-07-14 NOTE — Telephone Encounter (Signed)
S/w pt wife re appts for 9/24 and 10/1. Per wife 9/23 moved to 9/24 due to she has therapy that day.

## 2012-07-17 ENCOUNTER — Other Ambulatory Visit: Payer: Medicare Other | Admitting: Lab

## 2012-07-18 ENCOUNTER — Other Ambulatory Visit (HOSPITAL_BASED_OUTPATIENT_CLINIC_OR_DEPARTMENT_OTHER): Payer: Medicare Other

## 2012-07-18 DIAGNOSIS — J449 Chronic obstructive pulmonary disease, unspecified: Secondary | ICD-10-CM

## 2012-07-18 DIAGNOSIS — C169 Malignant neoplasm of stomach, unspecified: Secondary | ICD-10-CM

## 2012-07-18 DIAGNOSIS — B0229 Other postherpetic nervous system involvement: Secondary | ICD-10-CM

## 2012-07-18 LAB — BASIC METABOLIC PANEL (CC13)
BUN: 7 mg/dL (ref 7.0–26.0)
Chloride: 105 mEq/L (ref 98–107)
Glucose: 98 mg/dl (ref 70–99)
Potassium: 4 mEq/L (ref 3.5–5.1)

## 2012-07-25 ENCOUNTER — Ambulatory Visit (HOSPITAL_BASED_OUTPATIENT_CLINIC_OR_DEPARTMENT_OTHER): Payer: Medicare Other | Admitting: Oncology

## 2012-07-25 ENCOUNTER — Telehealth: Payer: Self-pay | Admitting: Oncology

## 2012-07-25 VITALS — BP 98/52 | HR 97 | Temp 96.8°F | Resp 20 | Ht 67.0 in | Wt 116.6 lb

## 2012-07-25 DIAGNOSIS — C169 Malignant neoplasm of stomach, unspecified: Secondary | ICD-10-CM

## 2012-07-25 DIAGNOSIS — J449 Chronic obstructive pulmonary disease, unspecified: Secondary | ICD-10-CM

## 2012-07-25 DIAGNOSIS — F329 Major depressive disorder, single episode, unspecified: Secondary | ICD-10-CM

## 2012-07-25 DIAGNOSIS — C160 Malignant neoplasm of cardia: Secondary | ICD-10-CM

## 2012-07-25 DIAGNOSIS — D509 Iron deficiency anemia, unspecified: Secondary | ICD-10-CM

## 2012-07-25 NOTE — Progress Notes (Signed)
   [facility]    OFFICE PROGRESS NOTE   INTERVAL HISTORY:   He returns as scheduled. He continues to have malaise and inability to tolerate solid food. He had an episode of stabbing left low anterior chest discomfort last weekend. Stable exertional dyspnea. He eats noodles twice daily. He continues to have dizziness when standing.  Objective:  Vital signs in last 24 hours:  Blood pressure 98/52, pulse 97, temperature 96.8 F (36 C), temperature source Oral, resp. rate 20, height 5\' 7"  (1.702 m), weight 116 lb 9.6 oz (52.889 kg).    HEENT: The mucous membranes are moist Resp: Inspiratory rhonchi at the lower posterior chest bilaterally, no respiratory distress Cardio: Regular rate and rhythm GI: No hepatomegaly, nontender Vascular: No leg edema   Lab Results:  Lab Results  Component Value Date   WBC 7.5 06/13/2012   HGB 12.0* 06/13/2012   HCT 36.0* 06/13/2012   MCV 74.7* 06/13/2012   PLT 148 06/13/2012    07/18/2012-BUN 7, creatinine 0.8, cortisol 18.0  Medications: I have reviewed the patient's current medications.  Assessment/Plan: 1. Adenocarcinoma of the stomach. Adenocarcinoma found in the gastric remnant status post previous partial gastrectomy/Billroth II anastomosis. No evidence of metastatic disease by physical exam or CT of the abdomen/pelvis. Staging EUS on 03/30/2012 confirmed a uT3 uN0 tumor. He began radiation and concurrent Xeloda chemotherapy on 04/18/2012. Xeloda was subsequently placed on hold due to chest pain. Xeloda was resumed on 04/26/2012 with no further chest pain. The Xeloda dose was titrated to 1000 mg twice daily beginning 04/28/2012. He completed treatment on 05/30/2012. 2. Remote partial gastrectomy/Billroth II anastomosis secondary to ulcer disease. 3. Iron deficiency anemia secondary to the gastric mass status post red cell transfusion 03/17/2012. The hemoglobin is stable, he is not taking iron 4. COPD. 5. History of colon  polyps. 6. History of Chest pain/subxiphoid pain-most likely related to radiation esophagitis/gastritis or the gastric mass-he no longer has constant pain 7. Anorexia/weight loss-his weight is stable today 8. Depression-he started a trial of Remeron on 06/13/2012, no longer taking Remeron 9. Dyspnea/low-grade fever at an office visit on 06/20/2012-? COPD flare,? Radiation pneumonitis-he is completed a trial of prednisone. The symptoms have partially improved.   Disposition:  He continues to have a poor performance status. He has anorexia and dysphagia. A chemistry panel and cortisol level were unremarkable on 07/18/2012. The acute toxicity from chemotherapy and radiation should be resolved.  We decided to proceed with a restaging CT evaluation. He will be referred to Dr. Rhea Belton for a restaging upper endoscopy.  He will also schedule an appointment with Dr. Alphonsus Sias to discuss the hypotension and orthostatic symptoms.   Thornton Papas, MD  07/25/2012  5:21 PM

## 2012-07-25 NOTE — Telephone Encounter (Signed)
Gave pt appt for 10/145, will schedule the CT tomorrow and call patient

## 2012-07-26 ENCOUNTER — Telehealth: Payer: Self-pay | Admitting: Oncology

## 2012-07-26 ENCOUNTER — Encounter: Payer: Self-pay | Admitting: Internal Medicine

## 2012-07-26 NOTE — Telephone Encounter (Signed)
Called pt , left message regarding Ct appt on 08/01/12 , NPO 4 hours prior to CT

## 2012-07-26 NOTE — Telephone Encounter (Signed)
Talked to patient and girlfriend gave them appt for CT, Gi and Dr. Truett Perna

## 2012-07-26 NOTE — Telephone Encounter (Signed)
Pt's girlfriend called again, she changed Gi appt to November 2013 it was originally scheduled for October. She wants me to change MD visit to late October, I informed her that It is not my call to move appt without permission from MD since I don't know condition of patient

## 2012-07-27 ENCOUNTER — Telehealth: Payer: Self-pay | Admitting: *Deleted

## 2012-07-27 ENCOUNTER — Telehealth: Payer: Self-pay | Admitting: Oncology

## 2012-07-27 ENCOUNTER — Ambulatory Visit: Payer: Medicare Other | Admitting: Internal Medicine

## 2012-07-27 NOTE — Telephone Encounter (Signed)
Patient called to move his 10/14 visit with BS to after his Nov appointment with Dr. Velora Heckler. Reminded him the October visit is to go over his CT scan results of 08/01/12. He decided to keep the October appointment.

## 2012-07-27 NOTE — Telephone Encounter (Signed)
ret. a call and the pt req that his f/u appt be moved to after dr pyrtle,per rose they alreay moved the gi appt and rose advised susan of this as well.  i transfer. him to susan and did not r/s his appt until we are advised by the nurse of dr gbs     aom

## 2012-08-01 ENCOUNTER — Ambulatory Visit (HOSPITAL_COMMUNITY)
Admission: RE | Admit: 2012-08-01 | Discharge: 2012-08-01 | Disposition: A | Discharge status: Home / Self Care | Payer: Medicare Other | Source: Ambulatory Visit | Attending: Oncology | Admitting: Oncology

## 2012-08-01 DIAGNOSIS — E278 Other specified disorders of adrenal gland: Secondary | ICD-10-CM | POA: Insufficient documentation

## 2012-08-01 DIAGNOSIS — K7689 Other specified diseases of liver: Secondary | ICD-10-CM | POA: Insufficient documentation

## 2012-08-01 DIAGNOSIS — C169 Malignant neoplasm of stomach, unspecified: Secondary | ICD-10-CM

## 2012-08-01 DIAGNOSIS — R911 Solitary pulmonary nodule: Secondary | ICD-10-CM | POA: Insufficient documentation

## 2012-08-01 MED ORDER — IOHEXOL 300 MG/ML  SOLN
100.0000 mL | Freq: Once | INTRAMUSCULAR | Status: AC | PRN
  Administered 2012-08-01: 100 mL via INTRAVENOUS

## 2012-08-07 ENCOUNTER — Ambulatory Visit (HOSPITAL_BASED_OUTPATIENT_CLINIC_OR_DEPARTMENT_OTHER): Payer: Medicare Other | Admitting: Oncology

## 2012-08-07 VITALS — BP 85/56 | HR 98 | Temp 96.8°F | Resp 18 | Ht 67.0 in | Wt 115.7 lb

## 2012-08-07 DIAGNOSIS — C169 Malignant neoplasm of stomach, unspecified: Secondary | ICD-10-CM

## 2012-08-07 DIAGNOSIS — C160 Malignant neoplasm of cardia: Secondary | ICD-10-CM

## 2012-08-07 DIAGNOSIS — K7689 Other specified diseases of liver: Secondary | ICD-10-CM

## 2012-08-07 DIAGNOSIS — R0609 Other forms of dyspnea: Secondary | ICD-10-CM

## 2012-08-07 DIAGNOSIS — R630 Anorexia: Secondary | ICD-10-CM

## 2012-08-07 NOTE — Progress Notes (Signed)
   [facility]    OFFICE PROGRESS NOTE   INTERVAL HISTORY:   He returns as scheduled. He continues to feel weak. He has intermittent solid dysphagia. He feels "dizzy "when he gets up quickly. No pain. He stays in a house most of the day. Danny Page and his wife both had an upper respiratory infection last week with a productive cough. No fever. These symptoms have resolved.  Objective:  Vital signs in last 24 hours:  Blood pressure 85/56, pulse 98, temperature 96.8 F (36 C), temperature source Oral, resp. rate 18, height 5\' 7"  (1.702 m), weight 115 lb 11.2 oz (52.481 kg).  Manual orthostatic vital signs: Lying-blood pressure 122/69, pulse 92     standing-blood pressure 100/56, pulse 108  HEENT: The mucous membranes are moist Resp: End inspiratory rhonchi at the posterior base bilaterally, no respiratory distress Cardio: Regular rate and rhythm GI: No hepatomegaly, nontender Vascular: No leg edema  Skin: Radiation hyperpigmentation at the right back     Lab Results:  Lab Results  Component Value Date   WBC 7.5 06/13/2012   HGB 12.0* 06/13/2012   HCT 36.0* 06/13/2012   MCV 74.7* 06/13/2012   PLT 148 06/13/2012   X-rays: CT the chest and abdomen on 08/01/2012-no evidence of metastatic disease in the chest, stable 5 mm left lower lobe subpleural nodule. The prior gastric mass is not well visualized, no evidence of metastatic disease in the abdomen, subtle 8 mm hypoenhancing lesion in the left liver of unclear significance  Medications: I have reviewed the patient's current medications.  Assessment/Plan: 1. Adenocarcinoma of the stomach. Adenocarcinoma found in the gastric remnant status post previous partial gastrectomy/Billroth II anastomosis. No evidence of metastatic disease by physical exam or CT of the abdomen/pelvis. Staging EUS on 03/30/2012 confirmed a uT3 uN0 tumor. He began radiation and concurrent Xeloda chemotherapy on 04/18/2012. Xeloda was subsequently  placed on hold due to chest pain. Xeloda was resumed on 04/26/2012 with no further chest pain. The Xeloda dose was titrated to 1000 mg twice daily beginning 04/28/2012. He completed treatment on 05/30/2012. Restaging CT on 08/01/2012 with no evidence of metastatic disease. 2. Remote partial gastrectomy/Billroth II anastomosis secondary to ulcer disease. 3. Iron deficiency anemia secondary to the gastric mass status post red cell transfusion 03/17/2012. The hemoglobin is stable, he is not taking iron 4. COPD. 5. History of colon polyps. 6. History of Chest pain/subxiphoid pain-most likely related to radiation esophagitis/gastritis or the gastric mass-he no longer has constant pain 7. Anorexia/weight loss-? Secondary to gastric cancer or COPD 8. Depression-he started a trial of Remeron on 06/13/2012, no longer taking Remeron 9. Dyspnea/low-grade fever at an office visit on 06/20/2012-? COPD flare,? Radiation pneumonitis-he is completed a trial of prednisone. The symptoms have partially improved. 10. Orthostasis-he will schedule an appointment with Dr. Alphonsus Sias, he may benefit from a trial of Florinef. I recommended he obtain support stockings and get up slowly from a sitting or lying position 11. Dysphagia-he has been referred to Dr. Margretta Sidle for a repeat endoscopy,? Esophageal stricture 12. Indeterminate 8 mm left liver lesion on the CT 08/01/2012.   Disposition:  He appears stable. No clinical or CT evidence for progression of the gastric cancer. He plans to followup with Dr. Alphonsus Sias for evaluation of the orthostasis. Danny Page will return for an office visit here in 2 months.   Thornton Papas, MD  08/07/2012  5:32 PM

## 2012-08-08 ENCOUNTER — Telehealth: Payer: Self-pay | Admitting: *Deleted

## 2012-08-08 NOTE — Telephone Encounter (Signed)
Mailed out calendar to inform the patient of the new date and time on 10-16-2012 at 4:00pm

## 2012-08-14 ENCOUNTER — Ambulatory Visit (INDEPENDENT_AMBULATORY_CARE_PROVIDER_SITE_OTHER): Payer: Medicare Other | Admitting: Internal Medicine

## 2012-08-14 ENCOUNTER — Encounter: Payer: Self-pay | Admitting: Internal Medicine

## 2012-08-14 VITALS — BP 77/48 | HR 92 | Temp 97.5°F | Wt 114.0 lb

## 2012-08-14 DIAGNOSIS — C169 Malignant neoplasm of stomach, unspecified: Secondary | ICD-10-CM

## 2012-08-14 DIAGNOSIS — R42 Dizziness and giddiness: Secondary | ICD-10-CM

## 2012-08-14 DIAGNOSIS — R630 Anorexia: Secondary | ICD-10-CM

## 2012-08-14 DIAGNOSIS — J449 Chronic obstructive pulmonary disease, unspecified: Secondary | ICD-10-CM

## 2012-08-14 MED ORDER — PREDNISONE 20 MG PO TABS: 40.0000 mg | ORAL_TABLET | Freq: Every day | ORAL | Status: DC

## 2012-08-14 NOTE — Assessment & Plan Note (Signed)
With huge weight loss No appetite and early satiety---could be related to RT and hopefully at least partially reversible Discussed frequent small meals Will assess response to the steroids and consider adding mirtazapine

## 2012-08-14 NOTE — Assessment & Plan Note (Signed)
Has responded to his Rx Getting further eval to confirm response

## 2012-08-14 NOTE — Assessment & Plan Note (Signed)
Dizziness sounds more like vertigo though BP is low I am afraid to try meclizine Will just use the steroids for now Could consider fludrocortisone if not on the steroids later

## 2012-08-14 NOTE — Assessment & Plan Note (Signed)
Breathing is worse May relate to decreased functional status from his cancer treatment The steroids are for acute rx usually but may help on multiple levels--- will try brief course and assess response Advised him to continue the spiriva

## 2012-08-14 NOTE — Progress Notes (Signed)
  Subjective:    Patient ID: Danny Page, male    DOB: 1935-09-12, 77 y.o.   MRN: [id number]  HPI Here with wife  Extensive cancer treatments but seems to be free of cancer now Due for repeat EGD by Dr Rhea Belton to confirm resolution  Has noticed dizziness  BP is very low-- 80's systolic He notes spinning sensation if he gets up from lying position Doesn't feel syncopal---just balance issues  Has had SOB He wonders about steroids for this DOE just walking in house No SOB at rest No sig cough  No appetite No taste Full after eating 2 bites  Current Outpatient Prescriptions on File Prior to Visit  Medication Sig Dispense Refill  . SPIRIVA HANDIHALER 18 MCG inhalation capsule INHALE ONE DOSE EVERY DAY  30 each  11    No Known Allergies  Past Medical History  Diagnosis Date  . COPD (chronic obstructive pulmonary disease)   . Post herpetic neuralgia   . Hemorrhoids   . Anemia   . Stomach cancer     Past Surgical History  Procedure Date  . Stomach surgery     for ulcers, age 26  . Eus 03/30/2012    Procedure: UPPER ENDOSCOPIC ULTRASOUND (EUS) LINEAR;  Surgeon: Rachael Fee, MD;  Location: [facility];  Service: Endoscopy;  Laterality: N/A;    Family History  Problem Relation Age of Onset  . Heart disease Brother   . Cancer Mother 51    breast ca with mets to lymph nodes    History   Social History  . Marital Status: Married    Spouse Name: N/A    Number of Children: N/A  . Years of Education: N/A   Occupational History  . retired, Art gallery manager    Social History Main Topics  . Smoking status: Former Smoker -- 45 years    Types: Cigarettes    Quit date: 10/26/2003  . Smokeless tobacco: Never Used  . Alcohol Use: No     quit about 2004, heavy in the past  . Drug Use: No  . Sexually Active: Not on file   Other Topics Concern  . Not on file   Social History Narrative   No living willRequests wife as health care POAWould accept resuscitation  attemptsWould probably accept feeding tube   Review of Systems Sleeps okay Has lost about 30# through this illness    Objective:   Physical Exam  Constitutional: No distress.       Clear weight loss  Neck: Normal range of motion. Neck supple.  Cardiovascular: Normal rate, regular rhythm and normal heart sounds.  Exam reveals no gallop.   No murmur heard. Pulmonary/Chest: Effort normal. No respiratory distress. He has no wheezes. He has no rales.       Mildly decreased breath sounds  Abdominal: Soft. There is no tenderness.  Musculoskeletal: He exhibits no edema.  Lymphadenopathy:    He has no cervical adenopathy.  Psychiatric: He has a normal mood and affect. His behavior is normal.       Somewhat flat          Assessment & Plan:

## 2012-08-21 ENCOUNTER — Ambulatory Visit: Payer: Medicare Other | Admitting: Internal Medicine

## 2012-08-24 ENCOUNTER — Ambulatory Visit: Payer: Medicare Other | Admitting: Radiation Oncology

## 2012-08-31 ENCOUNTER — Ambulatory Visit (INDEPENDENT_AMBULATORY_CARE_PROVIDER_SITE_OTHER): Payer: Medicare Other | Admitting: Internal Medicine

## 2012-08-31 ENCOUNTER — Encounter: Payer: Self-pay | Admitting: Internal Medicine

## 2012-08-31 VITALS — BP 88/55 | HR 105 | Temp 97.5°F | Wt 113.0 lb

## 2012-08-31 DIAGNOSIS — J449 Chronic obstructive pulmonary disease, unspecified: Secondary | ICD-10-CM

## 2012-08-31 DIAGNOSIS — R630 Anorexia: Secondary | ICD-10-CM

## 2012-08-31 MED ORDER — MIRTAZAPINE 15 MG PO TABS: 15.0000 mg | ORAL_TABLET | Freq: Every day | ORAL | Status: DC

## 2012-08-31 MED ORDER — ALBUTEROL SULFATE HFA 108 (90 BASE) MCG/ACT IN AERS: 2.0000 | INHALATION_SPRAY | Freq: Three times a day (TID) | RESPIRATORY_TRACT | Status: DC | PRN

## 2012-08-31 NOTE — Progress Notes (Signed)
  Subjective:    Patient ID: Danny Page, male    DOB: 1934-12-27, 77 y.o.   MRN: [id number]  HPI Here with wife again On the prednisone but hasn't seen any change  Breathing is still poor Not much cough Hasn't used rescue inhaler in many years---didn't think it helped  No change in his appetite No change in his vertigo or dizziness  Current Outpatient Prescriptions on File Prior to Visit  Medication Sig Dispense Refill  . predniSONE (DELTASONE) 20 MG tablet Take 2 tablets (40 mg total) by mouth daily.  60 tablet  0  . SPIRIVA HANDIHALER 18 MCG inhalation capsule INHALE ONE DOSE EVERY DAY  30 each  11    No Known Allergies  Past Medical History  Diagnosis Date  . COPD (chronic obstructive pulmonary disease)   . Post herpetic neuralgia   . Hemorrhoids   . Anemia   . Stomach cancer     Past Surgical History  Procedure Date  . Stomach surgery     for ulcers, age 73  . Eus 03/30/2012    Procedure: UPPER ENDOSCOPIC ULTRASOUND (EUS) LINEAR;  Surgeon: Rachael Fee, MD;  Location: [facility];  Service: Endoscopy;  Laterality: N/A;    Family History  Problem Relation Age of Onset  . Heart disease Brother   . Cancer Mother 28    breast ca with mets to lymph nodes    History   Social History  . Marital Status: Married    Spouse Name: N/A    Number of Children: N/A  . Years of Education: N/A   Occupational History  . retired, Art gallery manager    Social History Main Topics  . Smoking status: Former Smoker -- 45 years    Types: Cigarettes    Quit date: 10/26/2003  . Smokeless tobacco: Never Used  . Alcohol Use: No     Comment: quit about 2004, heavy in the past  . Drug Use: No  . Sexually Active: Not on file   Other Topics Concern  . Not on file   Social History Narrative   No living willRequests wife as health care POAWould accept resuscitation attemptsWould probably accept feeding tube   Review of Systems Forces himself to eat but still has no taste and  little appetite Weight down 1# Sleeps okay    Objective:   Physical Exam  Constitutional: No distress.  Pulmonary/Chest: Effort normal. No respiratory distress. He has no wheezes. He has no rales.       Decreased breath sounds but not tight          Assessment & Plan:

## 2012-08-31 NOTE — Assessment & Plan Note (Signed)
No better on prednisone Will try mirtazapine

## 2012-08-31 NOTE — Assessment & Plan Note (Signed)
Prednisone no help Will add albuterol to see if it helps symptoms

## 2012-08-31 NOTE — Patient Instructions (Signed)
Please take the prednisone 20mg  daily for 1 more week and then stop it. Try the albuterol inhaler to see if it helps your breathing  Start the mirtazapine at bedtime

## 2012-09-07 ENCOUNTER — Encounter: Payer: Self-pay | Admitting: Internal Medicine

## 2012-09-08 ENCOUNTER — Encounter: Payer: Self-pay | Admitting: Internal Medicine

## 2012-09-08 ENCOUNTER — Ambulatory Visit (INDEPENDENT_AMBULATORY_CARE_PROVIDER_SITE_OTHER): Payer: Medicare Other | Admitting: Internal Medicine

## 2012-09-08 VITALS — BP 88/60 | HR 72 | Ht 67.0 in | Wt 118.0 lb

## 2012-09-08 DIAGNOSIS — C169 Malignant neoplasm of stomach, unspecified: Secondary | ICD-10-CM

## 2012-09-08 DIAGNOSIS — R630 Anorexia: Secondary | ICD-10-CM

## 2012-09-08 NOTE — Patient Instructions (Addendum)
Follow up as needed with Dr Pyrtle. 

## 2012-09-08 NOTE — Progress Notes (Signed)
Subjective:    Patient ID: Danny Page, male    DOB: Sep 15, 1935, 77 y.o.   MRN: [id number]  HPI Danny Page is a 77 year old male with a past medical history of adenocarcinoma of the stomach found at endoscopy in evaluation for iron deficiency anemia who seen in followup. The patient is status post partial gastrectomy/Billroth II anastomosis, and after diagnosis of gastric cancer he began radiation and concurrent chemotherapy. He completed radiation and chemotherapy on 05/30/2012. Recent CT scan performed on 08/01/2012 revealed no evidence of metastatic disease. He was seen recently by Dr. Truett Perna and at that time had complaints of dysphagia and was referred for repeat endoscopy to evaluate for possible stricture. He returns today with his wife. Today he reports complete resolution of his esophageal dysphagia. His appetite is still poor, but he reports no trouble swallowing. He reports he is able to eat solids and liquids without trouble. He denies nausea and vomiting. He denies rectal bleeding and melena. He still has chronic dyspnea which he reports is at his baseline. No recent fevers or chills.  Review of Systems As per history of present illness, otherwise negative  Current Medications, Allergies, Past Medical History, Past Surgical History, Family History and Social History were reviewed in [employer] record.     Objective:   Physical Exam BP 88/60  Pulse 72  Ht 5\' 7"  (1.702 m)  Wt 118 lb (53.524 kg)  BMI 18.48 kg/m2 Constitutional: Chronically ill-appearing man in no acute distress HEENT: Normocephalic and atraumatic. Oropharynx is clear and moist. No oropharyngeal exudate. Conjunctivae are pale. No scleral icterus. Neck: Neck supple. Trachea midline. Cardiovascular: Normal rate, regular rhythm and intact distal pulses. No M/R/G Pulmonary/chest: Distant breath sounds without wheezing rales or rhonchi Abdominal: Soft, thin, nontender, nondistended. Bowel  sounds active throughout. Extremities: no clubbing, cyanosis, or edema Neurological: Alert and oriented to person place and time. Skin: Skin is warm and dry. No rashes noted. Psychiatric: Normal mood and affect. Behavior is normal.  CBC    Component Value Date/Time   WBC 7.5 06/13/2012 1200   WBC 6.7 03/17/2012 1209   RBC 4.82 06/13/2012 1200   RBC 3.10* 03/17/2012 1209   HGB 12.0* 06/13/2012 1200   HGB 7.2* 03/17/2012 1209   HCT 36.0* 06/13/2012 1200   HCT 23.1* 03/17/2012 1209   PLT 148 06/13/2012 1200   PLT 331 03/17/2012 1209   MCV 74.7* 06/13/2012 1200   MCV 74.5* 03/17/2012 1209   MCH 24.9* 06/13/2012 1200   MCH 23.2* 03/17/2012 1209   MCHC 33.3 06/13/2012 1200   MCHC 31.2 03/17/2012 1209   RDW 21.2* 06/13/2012 1200   RDW 19.8* 03/17/2012 1209   LYMPHSABS 2.4 06/13/2012 1200   LYMPHSABS 1.7 03/17/2012 1209   MONOABS 0.7 06/13/2012 1200   MONOABS 0.8 03/17/2012 1209   EOSABS 0.1 06/13/2012 1200   EOSABS 0.1 03/17/2012 1209   BASOSABS 0.1 06/13/2012 1200   BASOSABS 0.1 03/17/2012 1209    CMP     Component Value Date/Time   NA 139 07/18/2012 1520   NA 136 05/26/2012 0914   K 4.0 07/18/2012 1520   K 3.7 05/26/2012 0914   CL 105 07/18/2012 1520   CL 100 05/26/2012 0914   CO2 25 07/18/2012 1520   CO2 27 05/26/2012 0914   GLUCOSE 98 07/18/2012 1520   GLUCOSE 143* 05/26/2012 0914   BUN 7.0 07/18/2012 1520   BUN 9 05/26/2012 0914   CREATININE 0.8 07/18/2012 1520   CREATININE 0.89 05/26/2012  0914   CALCIUM 9.3 07/18/2012 1520   CALCIUM 9.1 05/26/2012 0914   PROT 6.1 05/26/2012 0914   ALBUMIN 4.0 05/26/2012 0914   AST 15 05/26/2012 0914   ALT 10 05/26/2012 0914   ALKPHOS 70 05/26/2012 0914   BILITOT 0.5 05/26/2012 0914   GFRNONAA 72* 03/17/2012 1209   GFRAA 83* 03/17/2012 1209   CT CHEST AND ABDOMEN WITH CONTRAST -- 08/01/2012   Technique:  Multidetector CT imaging of the chest and abdomen was performed following the standard protocol during bolus administration of intravenous contrast.   Contrast: OMNIPAQUE  IOHEXOL 300 MG/ML  SOLN   Comparison:  Chest dated 03/30/2012.  CT abdomen pelvis dated 03/17/2012.   CT CHEST   Findings:  5 mm subpleural left lower lobe nodule (series 5/image 55), unchanged.  Prior lingular opacity has resolved and was likely infectious/inflammatory.  No new/suspicious pulmonary nodules. Underlying mild centrilobular emphysematous changes.  No pleural effusion or pneumothorax.   Visualized thyroid is unremarkable.   The heart is normal size.  No pericardial effusion.  Coronary atherosclerosis.  Atherosclerotic calcifications of the aortic arch.   No suspicious mediastinal, hilar, or axillary lymphadenopathy.   Visualized osseous structures are within normal limits.   IMPRESSION: No evidence of metastatic disease in the chest.   Stable 5 mm subpleural left lower lobe nodule, favored to be benign.  In the setting of known malignancy, continued attention on follow-up is suggested.  (Note that this area would be included in a standard CT abdomen/pelvis.)   Mild centrilobular emphysematous changes.   CT ABDOMEN   Findings:  Prior gastric mass along the lesser curvature is not well visualized on the current study, which may reflect response to therapy.   Subtle 8 mm hypoenhancing lesion in the left hepatic lobe (series 2/image 57), equivocal and possibly reflecting focal fat or altered perfusion, although not definitely visualized on prior studies.   Spleen and pancreas are within normal limits.   Adenomatous hyperplasia of the bilateral adrenal glands, unchanged.   Gallbladder is within normal limits.  No intrahepatic or extrahepatic ductal dilatation.   Right kidney is notable for a 4 mm nonobstructing right renal calculus (series 2/image 73).  Left kidney is unremarkable.  No hydronephrosis.   No abdominal ascites.   No suspicious abdominal lymphadenopathy.   Very mild degenerative changes of the visualized lumbar spine.    IMPRESSION: Prior gastric mass is not well visualized on the current study.   No evidence of metastatic disease in the abdomen.   Subtle 8 mm hypoenhancing lesion in the left hepatic lobe, equivocal.  Given the small size, attention on follow-up is suggested.       Assessment & Plan:   77 year old male with a past medical history of adenocarcinoma of the stomach found at endoscopy in evaluation for iron deficiency anemia who seen in followup.  1.  Gastric cancer/now resolved dysphagia -- the patient is status post chemotherapy and radiation and by cross-sectional imaging his disease is stable with no evidence of metastatic disease. His esophageal dysphagia has resolved since being seen by Dr. Truett Perna, and therefore I do not think endoscopy is necessary at this time for this issue. I would like to touch base with Dr. Truett Perna to see if he would like me to repeat the endoscopy to survey for gastric cancer. I'm happy to perform this exam if it would change Dr. Kalman Drape management. I will contact him and let the patient know regarding this possible repeat examination.  The  patient and his wife understand and agree with this plan  2.  Anorexia -- he has persistent anorexia being managed by Dr. Alphonsus Sias.  It seems he did not respond to a trial of prednisone for appetite stimulation, and per Dr. Karle Starch last note, he was going to try mirtazapine. The patient is not currently taking mirtazapine and I will message Dr. Alphonsus Sias to see if he would like to start this medication.

## 2012-09-11 ENCOUNTER — Telehealth: Payer: Self-pay | Admitting: *Deleted

## 2012-09-11 ENCOUNTER — Other Ambulatory Visit: Payer: Self-pay | Admitting: *Deleted

## 2012-09-11 NOTE — Telephone Encounter (Signed)
Informed pt Dr Rhea Belton and Dr Truett Perna agree not to schedule an EGD at this time. Pt stated understanding in that he will call us if GI s&s return.

## 2012-09-11 NOTE — Telephone Encounter (Signed)
Message copied by Florene Glen on Mon Sep 11, 2012  9:19 AM ------      Message from: Beverley Fiedler      Created: Mon Sep 11, 2012  8:27 AM       Morning CB      Please call this patient and let him know Dr. Truett Perna agree with no EGD at this point. We would repeat the EGD if GI symptoms return and he should call us if this is the case.      Thanks            ----- Message -----         From: Ladene Artist, MD         Sent: 09/09/2012   5:57 PM           To: Beverley Fiedler, MD            I agree on holding on the EGD if his symptoms have resolved.  I think we should check an endoscopy to survey for cancer if GI symptoms return.            Thanks,            Nida Boatman      ----- Message -----         From: Beverley Fiedler, MD         Sent: 09/08/2012   5:32 PM           To: Ladene Artist, MD            Dr. Truett Perna,      I saw Mr. Gonyea today and his esophageal dysphagia has resolved. Based on this I do not think he needs a repeat endoscopy for this issue. I'm happy to perform a repeat EGD if you would like to reassess his gastric tumor. Based on your last note it appeared you requested EGD for dysphagia only, but given that this is resolved I wanted to make sure what you want.            Thanks            Vonna Kotyk Pyrtle

## 2012-10-06 ENCOUNTER — Ambulatory Visit: Payer: Medicare Other | Admitting: Internal Medicine

## 2012-10-06 DIAGNOSIS — Z0289 Encounter for other administrative examinations: Secondary | ICD-10-CM

## 2012-10-16 ENCOUNTER — Ambulatory Visit (HOSPITAL_BASED_OUTPATIENT_CLINIC_OR_DEPARTMENT_OTHER): Payer: Medicare Other | Admitting: Oncology

## 2012-10-16 VITALS — BP 107/70 | HR 91 | Temp 97.0°F | Resp 18 | Ht 67.0 in | Wt 117.1 lb

## 2012-10-16 DIAGNOSIS — R42 Dizziness and giddiness: Secondary | ICD-10-CM

## 2012-10-16 DIAGNOSIS — J449 Chronic obstructive pulmonary disease, unspecified: Secondary | ICD-10-CM

## 2012-10-16 DIAGNOSIS — K7689 Other specified diseases of liver: Secondary | ICD-10-CM

## 2012-10-16 DIAGNOSIS — C160 Malignant neoplasm of cardia: Secondary | ICD-10-CM

## 2012-10-16 DIAGNOSIS — C169 Malignant neoplasm of stomach, unspecified: Secondary | ICD-10-CM

## 2012-10-16 NOTE — Progress Notes (Signed)
   [facility]    OFFICE PROGRESS NOTE   INTERVAL HISTORY:   He returns as scheduled. He denies nausea and dysphagia. He continues to have a poor appetite, but he is eating more a variety of foods. The exertional dyspnea persists. He has intermittent episodes of "dizziness ". No pain.  Objective:  Vital signs in last 24 hours:  Blood pressure 107/70, pulse 91, temperature 97 F (36.1 C), temperature source Oral, resp. rate 18, height 5\' 7"  (1.702 m), weight 117 lb 1.6 oz (53.116 kg). manual vital signs: Supine-150/80, pulse 80       standing-120/64, pulse 100    HEENT: Neck without mass Lymphatics: No cervical, supraclavicular, or axillary nodes Resp: Distant breath sounds, and inspiratory rhonchi at the bases bilaterally, no respiratory distress Cardio: Regular rate and rhythm GI: No hepatomegaly, no mass, nontender Vascular: No leg edema    Medications: I have reviewed the patient's current medications.  Assessment/Plan: 1. Adenocarcinoma of the stomach. Adenocarcinoma found in the gastric remnant status post previous partial gastrectomy/Billroth II anastomosis. No evidence of metastatic disease by physical exam or CT of the abdomen/pelvis. Staging EUS on 03/30/2012 confirmed a uT3 uN0 tumor. He began radiation and concurrent Xeloda chemotherapy on 04/18/2012. Xeloda was subsequently placed on hold due to chest pain. Xeloda was resumed on 04/26/2012 with no further chest pain. The Xeloda dose was titrated to 1000 mg twice daily beginning 04/28/2012. He completed treatment on 05/30/2012. Restaging CT on 08/01/2012 with no evidence of metastatic disease. 2. Remote partial gastrectomy/Billroth II anastomosis secondary to ulcer disease. 3. Iron deficiency anemia secondary to the gastric mass status post red cell transfusion 03/17/2012. The hemoglobin was improved when last checked on 06/13/2012.  4. COPD. 5. History of colon polyps. 6. History of Chest pain/subxiphoid  pain-most likely related to radiation esophagitis/gastritis or the gastric mass-he no longer has pain 7. Anorexia/weight loss-? Secondary to gastric cancer or COPD, his weight has stabilized over the past few months 8. Depression-he started a trial of Remeron on 06/13/2012, no longer taking Remeron 9. Dyspnea/low-grade fever at an office visit on 06/20/2012-? COPD flare,? Radiation pneumonitis-he completed a trial of prednisone. The symptoms  partially improved. 10. Orthostasis-he continues to have orthostatic symptoms and orthostatic vital signs changes 11. Dysphagia-none today, Dr.Pyrtle evaluated him and does not plan for an upper endoscopy unless he develops recurrent symptoms 12. Indeterminate 8 mm left liver lesion on the CT 08/01/2012.   Disposition:  He appears stable. I have a low clinical suspicion for progression of the gastric cancer. I suspect the majority of his symptoms are related to COPD. He will return for an office visit in 3 months.   Thornton Papas, MD  10/16/2012  5:34 PM

## 2012-10-17 ENCOUNTER — Telehealth: Payer: Self-pay | Admitting: Oncology

## 2012-10-17 NOTE — Telephone Encounter (Signed)
s.w. pt and gv March 2014 appt....pt aware

## 2013-01-09 ENCOUNTER — Ambulatory Visit (HOSPITAL_BASED_OUTPATIENT_CLINIC_OR_DEPARTMENT_OTHER): Payer: Medicare Other | Admitting: Oncology

## 2013-01-09 VITALS — BP 93/58 | HR 73 | Temp 99.1°F | Resp 18 | Ht 67.0 in | Wt 123.3 lb

## 2013-01-09 DIAGNOSIS — D649 Anemia, unspecified: Secondary | ICD-10-CM

## 2013-01-09 DIAGNOSIS — R0609 Other forms of dyspnea: Secondary | ICD-10-CM

## 2013-01-09 DIAGNOSIS — K7689 Other specified diseases of liver: Secondary | ICD-10-CM

## 2013-01-09 DIAGNOSIS — C160 Malignant neoplasm of cardia: Secondary | ICD-10-CM

## 2013-01-09 DIAGNOSIS — J449 Chronic obstructive pulmonary disease, unspecified: Secondary | ICD-10-CM

## 2013-01-09 NOTE — Progress Notes (Signed)
   [facility]    OFFICE PROGRESS NOTE   INTERVAL HISTORY:   He returns as scheduled. No nausea or abdominal pain. He continues to have exertional dyspnea. His appetite has improved.  Objective:  Vital signs in last 24 hours:  Blood pressure 93/58, pulse 73, temperature 99.1 F (37.3 C), temperature source Oral, resp. rate 18, height 5\' 7"  (1.702 m), weight 123 lb 4.8 oz (55.929 kg).    HEENT: The conjunctivae are pale, neck without mass Lymphatics: No cervical, supraclavicular, or axillary nodes Resp: Distant breath sounds, scattered end inspiratory rhonchi at the upper posterior chest, no respiratory distress Cardio: Regular rate and rhythm GI: No hepatomegaly, nontender, no mass Vascular: No leg edema   Lab Results:  Lab Results  Component Value Date   WBC 7.5 06/13/2012   HGB 12.0* 06/13/2012   HCT 36.0* 06/13/2012   MCV 74.7* 06/13/2012   PLT 148 06/13/2012    Medications: I have reviewed the patient's current medications.  Assessment/Plan: 1. Adenocarcinoma of the stomach. Adenocarcinoma found in the gastric remnant status post previous partial gastrectomy/Billroth II anastomosis. No evidence of metastatic disease by physical exam or CT of the abdomen/pelvis. Staging EUS on 03/30/2012 confirmed a uT3 uN0 tumor. He began radiation and concurrent Xeloda chemotherapy on 04/18/2012. Xeloda was subsequently placed on hold due to chest pain. Xeloda was resumed on 04/26/2012 with no further chest pain. The Xeloda dose was titrated to 1000 mg twice daily beginning 04/28/2012. He completed treatment on 05/30/2012. Restaging CT on 08/01/2012 with no evidence of metastatic disease. 2. Remote partial gastrectomy/Billroth II anastomosis secondary to ulcer disease. 3. Iron deficiency anemia secondary to the gastric mass status post red cell transfusion 03/17/2012. The hemoglobin was improved when last checked on 06/13/2012.  4. COPD. 5. History of colon  polyps. 6. History of Chest pain/subxiphoid pain-most likely related to radiation esophagitis/gastritis or the gastric mass-he no longer has pain 7. Anorexia/weight loss-? Secondary to gastric cancer or COPD, his weight has increased. 8. Depression-he started a trial of Remeron on 06/13/2012, no longer taking Remeron 9. Dyspnea/low-grade fever at an office visit on 06/20/2012-? COPD flare,? Radiation pneumonitis-he completed a trial of prednisone. The symptoms partially improved. 10. Orthostasis-he continues to have orthostatic symptoms  11. Dysphagia-none today, Dr.Pyrtle evaluated him and does not plan for an upper endoscopy unless he develops recurrent symptoms 12. Indeterminate 8 mm left liver lesion on the CT 08/01/2012.  Disposition:  There is no clinical evidence for progression of the gastric cancer. I suspect the exertional dyspnea is related to COPD. He will return for a CBC next week to be sure he has not developed progressive iron deficiency anemia. Mr. Schwager will return for an office visit in 4 months. He will contact us in the interim for new symptoms.   Thornton Papas, MD  01/09/2013  5:14 PM

## 2013-01-11 ENCOUNTER — Telehealth: Payer: Self-pay | Admitting: Oncology

## 2013-01-11 NOTE — Telephone Encounter (Signed)
Talked to pt and gave him appt for lab and MD visit on  July 2014 °

## 2013-01-18 ENCOUNTER — Other Ambulatory Visit (HOSPITAL_BASED_OUTPATIENT_CLINIC_OR_DEPARTMENT_OTHER): Payer: Medicare Other

## 2013-01-18 DIAGNOSIS — D649 Anemia, unspecified: Secondary | ICD-10-CM

## 2013-01-18 LAB — CBC WITH DIFFERENTIAL/PLATELET
BASO%: 1 % (ref 0.0–2.0)
Eosinophils Absolute: 0.1 10*3/uL (ref 0.0–0.5)
LYMPH%: 45.2 % (ref 14.0–49.0)
MCHC: 32.1 g/dL (ref 32.0–36.0)
MONO#: 0.6 10*3/uL (ref 0.1–0.9)
NEUT#: 3 10*3/uL (ref 1.5–6.5)
Platelets: 300 10*3/uL (ref 140–400)
RBC: 4.48 10*6/uL (ref 4.20–5.82)
RDW: 17.5 % — ABNORMAL HIGH (ref 11.0–14.6)
WBC: 6.8 10*3/uL (ref 4.0–10.3)
lymph#: 3.1 10*3/uL (ref 0.9–3.3)
nRBC: 0 % (ref 0–0)

## 2013-01-22 ENCOUNTER — Telehealth: Payer: Self-pay | Admitting: *Deleted

## 2013-01-22 NOTE — Telephone Encounter (Signed)
Message copied by Gala Romney on Mon Jan 22, 2013  3:56 PM ------      Message from: Thornton Papas B      Created: Sun Jan 21, 2013 10:15 AM       Please call patient, hb is still low, restart iron -ferrous sulfate 325mg  bid ------

## 2013-01-22 NOTE — Telephone Encounter (Signed)
Pt notified of low hgb.. Pt instructed to restart iron pills and take twice a day.  Pt verbalized understanding of instructions.

## 2013-01-23 ENCOUNTER — Other Ambulatory Visit: Payer: Self-pay | Admitting: *Deleted

## 2013-01-23 DIAGNOSIS — D649 Anemia, unspecified: Secondary | ICD-10-CM

## 2013-01-23 MED ORDER — FERROUS SULFATE 325 (65 FE) MG PO TBEC: 325.0000 mg | DELAYED_RELEASE_TABLET | Freq: Two times a day (BID) | ORAL | Status: DC

## 2013-02-11 ENCOUNTER — Other Ambulatory Visit: Payer: Self-pay | Admitting: Internal Medicine

## 2013-03-12 ENCOUNTER — Other Ambulatory Visit: Payer: Self-pay | Admitting: Oncology

## 2013-03-12 DIAGNOSIS — C169 Malignant neoplasm of stomach, unspecified: Secondary | ICD-10-CM

## 2013-03-15 ENCOUNTER — Other Ambulatory Visit: Payer: Self-pay | Admitting: Internal Medicine

## 2013-03-15 NOTE — Telephone Encounter (Signed)
Okay to fill for 3 months Have him set up an appt to review his status within that time

## 2013-03-15 NOTE — Telephone Encounter (Signed)
rx sent to pharmacy by e-script  

## 2013-03-15 NOTE — Telephone Encounter (Signed)
Ok to fill 

## 2013-03-21 ENCOUNTER — Telehealth: Payer: Self-pay | Admitting: *Deleted

## 2013-03-21 NOTE — Telephone Encounter (Signed)
Late entry:  Pt's wife called 03/20/13 stating "my husband said he threw-up last week and he is sick on his stomach"  Spoke with pt's wife and she stated "he will never tell me if anything is wrong"  States pt has no fever, denies pain and is eating/drinking well.  MD made aware.  03/21/13 Per MD, called and spoke with pt; he states "no problems, I think I got some bad shrimp or scallops last week but everything OK"  Continues to deny being sick on stomach, no pain/fever and stated he is eating/drinking "good"  Confirmed appt for 05/14/13 and instructed pt to call if any problems.  Pt verbalized understanding and expressed appreciation for call.  Wife notified of information.

## 2013-04-30 ENCOUNTER — Telehealth: Payer: Self-pay | Admitting: *Deleted

## 2013-04-30 NOTE — Telephone Encounter (Signed)
Patient's wife [name], called requesting Reeve be seen sooner than the scheduled 05-14-2013 f/u.  Reports being instructed to call if any complications.  Today calling because Danny Page is having nausea and hiccups.  Both he reported to her three weeks ago but probably started even earlier.  Hiccups every day and uncontrollable.  Doesn't eat but tries to drink ensure and boost supplements. Wife unable to tell me how much he drinks but knows it's not close to 64 oz.  Denies trouble with bowel or bladder.  Denies use of anti-emetics and does not think he has any on hand.  Last emesis was three weeks ago but he is very nauseated.  She will look for Lorazepam bottle I see on med list and start this if she finds it.   Will notify providers.

## 2013-04-30 NOTE — Telephone Encounter (Signed)
Called patient who says he was able to locate the lorazepam for nausea.  Took one thirty minutes ago.  Asked thet he try GI Dr. Rhea Belton for evaluation of hiccups.  Danny Page reports this happens about every two to three hours and it is just one hiccup.  If it occurs at night he is not aware of it as he sleeps without being awakened.  Wants to thank Dr. Truett Perna for saving his life.  Denies further questions or needs.

## 2013-05-01 ENCOUNTER — Ambulatory Visit (INDEPENDENT_AMBULATORY_CARE_PROVIDER_SITE_OTHER): Payer: Medicare Other | Admitting: Gastroenterology

## 2013-05-01 ENCOUNTER — Telehealth: Payer: Self-pay | Admitting: Internal Medicine

## 2013-05-01 ENCOUNTER — Other Ambulatory Visit (INDEPENDENT_AMBULATORY_CARE_PROVIDER_SITE_OTHER): Payer: Medicare Other

## 2013-05-01 ENCOUNTER — Encounter: Payer: Self-pay | Admitting: Internal Medicine

## 2013-05-01 ENCOUNTER — Encounter: Payer: Self-pay | Admitting: Gastroenterology

## 2013-05-01 VITALS — BP 100/56 | HR 93 | Ht 66.0 in | Wt 117.0 lb

## 2013-05-01 DIAGNOSIS — C169 Malignant neoplasm of stomach, unspecified: Secondary | ICD-10-CM

## 2013-05-01 DIAGNOSIS — R066 Hiccough: Secondary | ICD-10-CM | POA: Insufficient documentation

## 2013-05-01 DIAGNOSIS — R110 Nausea: Secondary | ICD-10-CM

## 2013-05-01 LAB — HEMOGLOBIN: Hemoglobin: 12.1 g/dL — ABNORMAL LOW (ref 13.0–17.0)

## 2013-05-01 MED ORDER — CHLORPROMAZINE HCL 25 MG PO TABS: 25.0000 mg | ORAL_TABLET | Freq: Three times a day (TID) | ORAL | Status: DC

## 2013-05-01 NOTE — Telephone Encounter (Signed)
Pt has had chronic hiccups for several weeks. [facility] and was instructed to call us. Pt will see Doug Sou, PA at 1:30pm

## 2013-05-01 NOTE — Patient Instructions (Addendum)
You have been scheduled for an endoscopy with propofol. Please follow written instructions given to you at your visit today. If you use inhalers (even only as needed), please bring them with you on the day of your procedure. Your physician has requested that you go to www.startemmi.com and enter the access code given to you at your visit today. This web site gives a general overview about your procedure. However, you should still follow specific instructions given to you by our office regarding your preparation for the procedure.  Your physician has requested that you go to the basement for the following lab work before leaving today: HGB  We have sent the following medications to your pharmacy for you to pick up at your convenience: Thorazine                                               We are excited to introduce MyChart, a new best-in-class service that provides you online access to important information in your electronic medical record. We want to make it easier for you to view your health information - all in one secure location - when and where you need it. We expect MyChart will enhance the quality of care and service we provide.  When you register for MyChart, you can:    View your test results.    Request appointments and receive appointment reminders via email.    Request medication renewals.    View your medical history, allergies, medications and immunizations.    Communicate with your physician's office through a password-protected site.    Conveniently print information such as your medication lists.  To find out if MyChart is right for you, please talk to a member of our clinical staff today. We will gladly answer your questions about this free health and wellness tool.  If you are age 63 or older and want a member of your family to have access to your record, you must provide written consent by completing a proxy form available at our office. Please speak to our  clinical staff about guidelines regarding accounts for patients younger than age 73.  As you activate your MyChart account and need any technical assistance, please call the MyChart technical support line at [phone]) 83-CHART [phone])471-5754) or email your question to [email or web address]. If you email your question(s), please include your name, a return phone number and the best time to reach you.  If you have non-urgent health-related questions, you can send a message to our office through MyChart at [email or web address]. If you have a medical emergency, call 911.  Thank you for using MyChart as your new health and wellness resource!   MyChart licensed from [employer],  4540-9811. Patents Pending.

## 2013-05-01 NOTE — Progress Notes (Addendum)
05/01/2013 Danny Page [id number] 06/08/1935   History of Present Illness:  Danny Page is a 78 year old male with a past medical history of adenocarcinoma of the stomach found at endoscopy in evaluation for iron deficiency anemia. The patient is status post partial gastrectomy/Billroth II anastomosis, and after diagnosis of gastric cancer he began radiation and concurrent chemotherapy. He completed radiation and chemotherapy on 05/30/2012. CT scan performed on 08/01/2012 revealed no evidence of metastatic disease. He follows with Dr. Truett Perna for oncology.  It was decided that he would not undergo another EGD unless he developed some symptoms to warrant evaluation.  He comes in today for evaluation of hiccups that have been present for 3 weeks.  Says that they occur throughout the day, every day.  Only hiccups once or twice then will be an hour or two before it occurs again.  Does not notice if it occurs at night (does not keep him from sleep).  Occurs whether he eats or not.  He is concerned about it due to his history of gastric cancer.  Denies nausea, vomiting, and abdominal pain.  Says that appetite is ok, and his weight is stable from his last visit here in November 2013.    He does mention that he has been getting dizzy on occasion when sitting up or standing up (orthostasis).  Dr. Truett Perna is aware of this as well.  Last labs were in March where Hgb was 10.6 grams.  No dark or bloody stools reported.   Current Medications, Allergies, Past Medical History, Past Surgical History, Family History and Social History were reviewed in [employer] record.   Physical Exam: BP 100/56  Pulse 93  Ht 5\' 6"  (1.676 m)  Wt 117 lb (53.071 kg)  BMI 18.89 kg/m2 General: Thin white male in no acute distress Head: Normocephalic and atraumatic Eyes:  sclerae anicteric, conjunctiva pink  Ears: Normal auditory acuity Lungs: Clear throughout to auscultation Heart: Regular rate and  rhythm Abdomen: Soft, non-tender and non-distended. No masses, no hepatomegaly. Normal bowel sounds. Musculoskeletal: Symmetrical with no gross deformities  Extremities: No edema  Neurological: Alert oriented x 4, grossly nonfocal Psychological:  Alert and cooperative. Normal mood and affect  Assessment and Recommendations: -Hiccups x 3 weeks.  Will give him a prescription for thorazine 25 mg to take TID.  We will also schedule him for an endoscopy with his history of gastric cancer, and this is a new symptom for him.  (Sometimes sedation/anesthesia can be therapeutic to break the cycle of hiccups as well). -Adenocarcinoma of the stomach:  S/p chemo and radiation.  Follows with Dr. Truett Perna.  *Patient states that he has been getting dizziness at times when he gets up, which Dr. Truett Perna is aware of also.  I have asked him to get labs performed, but he said that they always take too much blood from him.  I have asked him to at least have a Hgb drawn to be sure that this is not trending down since the previous one in March.  Addendum: Reviewed and agree with initial management.  Agree with repeat EGD Beverley Fiedler, MD

## 2013-05-08 ENCOUNTER — Ambulatory Visit (AMBULATORY_SURGERY_CENTER): Payer: Medicare Other | Admitting: Internal Medicine

## 2013-05-08 ENCOUNTER — Encounter: Payer: Self-pay | Admitting: Internal Medicine

## 2013-05-08 VITALS — BP 135/80 | HR 77 | Temp 96.4°F | Resp 16 | Ht 66.0 in | Wt 117.0 lb

## 2013-05-08 DIAGNOSIS — R066 Hiccough: Secondary | ICD-10-CM

## 2013-05-08 DIAGNOSIS — Z85028 Personal history of other malignant neoplasm of stomach: Secondary | ICD-10-CM

## 2013-05-08 DIAGNOSIS — K2970 Gastritis, unspecified, without bleeding: Secondary | ICD-10-CM

## 2013-05-08 DIAGNOSIS — K2990 Gastroduodenitis, unspecified, without bleeding: Secondary | ICD-10-CM

## 2013-05-08 DIAGNOSIS — C169 Malignant neoplasm of stomach, unspecified: Secondary | ICD-10-CM

## 2013-05-08 MED ORDER — PANTOPRAZOLE SODIUM 40 MG PO TBEC: 40.0000 mg | DELAYED_RELEASE_TABLET | Freq: Every day | ORAL | Status: DC

## 2013-05-08 MED ORDER — SODIUM CHLORIDE 0.9 % IV SOLN: 500.0000 mL | INTRAVENOUS | Status: DC

## 2013-05-08 MED ORDER — FLUCONAZOLE 100 MG PO TABS: 100.0000 mg | ORAL_TABLET | Freq: Every day | ORAL | Status: DC

## 2013-05-08 MED ORDER — SUCRALFATE 1 GM/10ML PO SUSP: 1.0000 g | Freq: Four times a day (QID) | ORAL | Status: DC

## 2013-05-08 NOTE — Progress Notes (Deleted)
RESUME YOUR PANTOPRAZOLE 40 MG. DAILY. SUCRALFATE 1 GM 4 TIMES DAILY. FLUCONAZOLE 200 MG TIMES ONE, THEN 100 MG DAILY FOR 10 DAYS.      YOU HAD AN ENDOSCOPIC PROCEDURE TODAY AT THE [facility]: Refer to the procedure report that was given to you for any specific questions about what was found during the examination.  If the procedure report does not answer your questions, please call your gastroenterologist to clarify.  If you requested that your care partner not be given the details of your procedure findings, then the procedure report has been included in a sealed envelope for you to review at your convenience later.  YOU SHOULD EXPECT: Some feelings of bloating in the abdomen. Passage of more gas than usual.  Walking can help get rid of the air that was put into your GI tract during the procedure and reduce the bloating. If you had a lower endoscopy (such as a colonoscopy or flexible sigmoidoscopy) you may notice spotting of blood in your stool or on the toilet paper. If you underwent a bowel prep for your procedure, then you may not have a normal bowel movement for a few days.  DIET: Your first meal following the procedure should be a light meal and then it is ok to progress to your normal diet.  A half-sandwich or bowl of soup is an example of a good first meal.  Heavy or fried foods are harder to digest and may make you feel nauseous or bloated.  Likewise meals heavy in dairy and vegetables can cause extra gas to form and this can also increase the bloating.  Drink plenty of fluids but you should avoid alcoholic beverages for 24 hours.  ACTIVITY: Your care partner should take you home directly after the procedure.  You should plan to take it easy, moving slowly for the rest of the day.  You can resume normal activity the day after the procedure however you should NOT DRIVE or use heavy machinery for 24 hours (because of the sedation medicines used during the test).    SYMPTOMS TO  REPORT IMMEDIATELY: A gastroenterologist can be reached at any hour.  During normal business hours, 8:30 AM to 5:00 PM Monday through Friday, call [phone].  After hours and on weekends, please call the GI answering service at [phone] who will take a message and have the physician on call contact you.  Following upper endoscopy (EGD)  Vomiting of blood or coffee ground material  New chest pain or pain under the shoulder blades  Painful or persistently difficult swallowing  New shortness of breath  Fever of 100F or higher  Black, tarry-looking stools  FOLLOW UP: If any biopsies were taken you will be contacted by phone or by letter within the next 1-3 weeks.  Call your gastroenterologist if you have not heard about the biopsies in 3 weeks.  Our staff will call the home number listed on your records the next business day following your procedure to check on you and address any questions or concerns that you may have at that time regarding the information given to you following your procedure. This is a courtesy call and so if there is no answer at the home number and we have not heard from you through the emergency physician on call, we will assume that you have returned to your regular daily activities without incident.  SIGNATURES/CONFIDENTIALITY: You and/or your care partner have signed paperwork which will be entered into your electronic  medical record.  These signatures attest to the fact that that the information above on your After Visit Summary has been reviewed and is understood.  Full responsibility of the confidentiality of this discharge information lies with you and/or your care-partner.

## 2013-05-08 NOTE — Progress Notes (Signed)
Patient did not experience any of the following events: a burn prior to discharge; a fall within the facility; wrong site/side/patient/procedure/implant event; or a hospital transfer or hospital admission upon discharge from the facility. (G8907) Patient did not have preoperative order for IV antibiotic SSI prophylaxis. (G8918)  

## 2013-05-08 NOTE — Progress Notes (Deleted)
YOU HAD AN ENDOSCOPIC PROCEDURE TODAY AT THE [facility]: Refer to the procedure report that was given to you for any specific questions about what was found during the examination.  If the procedure report does not answer your questions, please call your gastroenterologist to clarify.  If you requested that your care partner not be given the details of your procedure findings, then the procedure report has been included in a sealed envelope for you to review at your convenience later.  YOU SHOULD EXPECT: Some feelings of bloating in the abdomen. Passage of more gas than usual.  Walking can help get rid of the air that was put into your GI tract during the procedure and reduce the bloating. If you had a lower endoscopy (such as a colonoscopy or flexible sigmoidoscopy) you may notice spotting of blood in your stool or on the toilet paper. If you underwent a bowel prep for your procedure, then you may not have a normal bowel movement for a few days.  DIET: Your first meal following the procedure should be a light meal and then it is ok to progress to your normal diet.  A half-sandwich or bowl of soup is an example of a good first meal.  Heavy or fried foods are harder to digest and may make you feel nauseous or bloated.  Likewise meals heavy in dairy and vegetables can cause extra gas to form and this can also increase the bloating.  Drink plenty of fluids but you should avoid alcoholic beverages for 24 hours.  ACTIVITY: Your care partner should take you home directly after the procedure.  You should plan to take it easy, moving slowly for the rest of the day.  You can resume normal activity the day after the procedure however you should NOT DRIVE or use heavy machinery for 24 hours (because of the sedation medicines used during the test).    SYMPTOMS TO REPORT IMMEDIATELY: A gastroenterologist can be reached at any hour.  During normal business hours, 8:30 AM to 5:00 PM Monday through Friday,  call [phone].  After hours and on weekends, please call the GI answering service at [phone] who will take a message and have the physician on call contact you.  Following upper endoscopy (EGD)  Vomiting of blood or coffee ground material  New chest pain or pain under the shoulder blades  Painful or persistently difficult swallowing  New shortness of breath  Fever of 100F or higher  Black, tarry-looking stools  FOLLOW UP: If any biopsies were taken you will be contacted by phone or by letter within the next 1-3 weeks.  Call your gastroenterologist if you have not heard about the biopsies in 3 weeks.  Our staff will call the home number listed on your records the next business day following your procedure to check on you and address any questions or concerns that you may have at that time regarding the information given to you following your procedure. This is a courtesy call and so if there is no answer at the home number and we have not heard from you through the emergency physician on call, we will assume that you have returned to your regular daily activities without incident.  SIGNATURES/CONFIDENTIALITY: You and/or your care partner have signed paperwork which will be entered into your electronic medical record.  These signatures attest to the fact that that the information above on your After Visit Summary has been reviewed and is understood.  Full responsibility of   the confidentiality of this discharge information lies with you and/or your care-partner. 

## 2013-05-08 NOTE — Progress Notes (Signed)
Called to room to assist during endoscopic procedure.  Patient ID and intended procedure confirmed with present staff. Received instructions for my participation in the procedure from the performing physician.  

## 2013-05-08 NOTE — Op Note (Signed)
[facility] [address] [place], [address]   ENDOSCOPY PROCEDURE REPORT  PATIENT: Keaton, Beichner  MR#: [id number] BIRTHDATE: 1935-09-30 , 77  yrs. old GENDER: Male ENDOSCOPIST: Beverley Fiedler, MD REFERRED BY:  Mardelle Matte, M.D. PROCEDURE DATE:  05/08/2013 PROCEDURE:  EGD w/ biopsy ASA CLASS:     Class III INDICATIONS:  history of gastric cancer status post radiation and chemotherapy; last endoscopy May 2013; hiccups. MEDICATIONS: MAC sedation, administered by CRNA and propofol (Diprivan) 150mg  IV TOPICAL ANESTHETIC: none  DESCRIPTION OF PROCEDURE: After the risks benefits and alternatives of the procedure were thoroughly explained, informed consent was obtained.  The LB [id number] endoscope was introduced through the mouth and advanced to the proximal jejunum. Without limitations.  The instrument was slowly withdrawn as the mucosa was fully examined.     ESOPHAGUS: White exudates consistent with candidiasis were found in the entire esophagus. Otherwise unremarkable esophagus  STOMACH: Abnormal mucosa was found on the remaining lesser curvature of the gastric body and in the cardia (in the location of previously seen tumor)  The mucosa was erythematous, very friable, with superficial ulceration and areas scarring.  Multiple biopsies were performed using cold forceps.  There was oozing of blood on contact.  No definite tumor identified.   A Billroth II anastomosis was found in the gastric antrum characterized as healthy in appearance.  JEJUNUM: The exam showed no abnormalities in the examined portions of the jejunum distal to the anastomosis.  Retroflexed views revealed a hiatal hernia.     The scope was then withdrawn from the patient and the procedure completed.  COMPLICATIONS: There were no complications.  ENDOSCOPIC IMPRESSION: 1.     White exudates consistent with candidiasis 2.   Abnormal mucosa was found on the lesser curvature of  the gastric body and in the cardia; The mucosa was erythematous, very friable with contact oozing, ulcerated and had scarring; multiple biopsies.  No definite tumor identified. 3.   Billroth II anastomosis was found in the gastric antrum 4.   The exam showed no abnormalities in the examined jejunum    RECOMMENDATIONS: 1.  Await pathology results 2.  Would resume pantoprazole 40 mg daily and sucralfate 1 g 4 times daily (before meals and at bedtime) 3.  Given friability of gastric mucosa, closely monitor hemoglobin and hematocrit.  Would continue iron supplementation. 4.  Fluconazole 200 mg PO for 1 day, followed by 100 mg PO for 13 days for Candida esophagitis   eSigned:  Beverley Fiedler, MD 05/08/2013 9:51 AM   [id number] Truett Perna, MD and The Patient  PATIENT NAME:  Damien, Batty MR#: [id number]

## 2013-05-08 NOTE — Patient Instructions (Addendum)
YOU HAD AN ENDOSCOPIC PROCEDURE TODAY AT THE [facility]: Refer to the procedure report that was given to you for any specific questions about what was found during the examination.  If the procedure report does not answer your questions, please call your gastroenterologist to clarify.  If you requested that your care partner not be given the details of your procedure findings, then the procedure report has been included in a sealed envelope for you to review at your convenience later.  YOU SHOULD EXPECT: Some feelings of bloating in the abdomen. Passage of more gas than usual.  Walking can help get rid of the air that was put into your GI tract during the procedure and reduce the bloating. If you had a lower endoscopy (such as a colonoscopy or flexible sigmoidoscopy) you may notice spotting of blood in your stool or on the toilet paper. If you underwent a bowel prep for your procedure, then you may not have a normal bowel movement for a few days.  DIET: Your first meal following the procedure should be a light meal and then it is ok to progress to your normal diet.  A half-sandwich or bowl of soup is an example of a good first meal.  Heavy or fried foods are harder to digest and may make you feel nauseous or bloated.  Likewise meals heavy in dairy and vegetables can cause extra gas to form and this can also increase the bloating.  Drink plenty of fluids but you should avoid alcoholic beverages for 24 hours.  ACTIVITY: Your care partner should take you home directly after the procedure.  You should plan to take it easy, moving slowly for the rest of the day.  You can resume normal activity the day after the procedure however you should NOT DRIVE or use heavy machinery for 24 hours (because of the sedation medicines used during the test).    SYMPTOMS TO REPORT IMMEDIATELY: A gastroenterologist can be reached at any hour.  During normal business hours, 8:30 AM to 5:00 PM Monday through Friday,  call [phone].  After hours and on weekends, please call the GI answering service at [phone] who will take a message and have the physician on call contact you.  Following upper endoscopy (EGD)  Vomiting of blood or coffee ground material  New chest pain or pain under the shoulder blades  Painful or persistently difficult swallowing  New shortness of breath  Fever of 100F or higher  Black, tarry-looking stools  FOLLOW UP: If any biopsies were taken you will be contacted by phone or by letter within the next 1-3 weeks.  Call your gastroenterologist if you have not heard about the biopsies in 3 weeks.  Our staff will call the home number listed on your records the next business day following your procedure to check on you and address any questions or concerns that you may have at that time regarding the information given to you following your procedure. This is a courtesy call and so if there is no answer at the home number and we have not heard from you through the emergency physician on call, we will assume that you have returned to your regular daily activities without incident.  SIGNATURES/CONFIDENTIALITY: You and/or your care partner have signed paperwork which will be entered into your electronic medical record.  These signatures attest to the fact that that the information above on your After Visit Summary has been reviewed and is understood.  Full responsibility of   the confidentiality of this discharge information lies with you and/or your care-partner. 

## 2013-05-09 ENCOUNTER — Telehealth: Payer: Self-pay | Admitting: *Deleted

## 2013-05-09 NOTE — Telephone Encounter (Signed)
  Follow up Call-  Call back number 05/08/2013 03/16/2012  Post procedure Call Back phone  # [phone]  Permission to leave phone message Yes Yes     Patient questions:  Do you have a fever, pain , or abdominal swelling? no Pain Score  0 *  Have you tolerated food without any problems? yes  Have you been able to return to your normal activities? yes  Do you have any questions about your discharge instructions: Diet   no Medications  no Follow up visit  no  Do you have questions or concerns about your Care? no  Actions: * If pain score is 4 or above: No action needed, pain <4.

## 2013-05-14 ENCOUNTER — Ambulatory Visit (HOSPITAL_BASED_OUTPATIENT_CLINIC_OR_DEPARTMENT_OTHER): Payer: Medicare Other | Admitting: Lab

## 2013-05-14 ENCOUNTER — Telehealth: Payer: Self-pay | Admitting: Oncology

## 2013-05-14 ENCOUNTER — Telehealth: Payer: Self-pay | Admitting: Medical Oncology

## 2013-05-14 ENCOUNTER — Ambulatory Visit (HOSPITAL_BASED_OUTPATIENT_CLINIC_OR_DEPARTMENT_OTHER): Payer: Medicare Other | Admitting: Oncology

## 2013-05-14 VITALS — BP 89/47 | HR 108 | Temp 98.1°F | Resp 17 | Ht 66.0 in | Wt 118.2 lb

## 2013-05-14 DIAGNOSIS — I951 Orthostatic hypotension: Secondary | ICD-10-CM

## 2013-05-14 DIAGNOSIS — C160 Malignant neoplasm of cardia: Secondary | ICD-10-CM

## 2013-05-14 DIAGNOSIS — R5381 Other malaise: Secondary | ICD-10-CM

## 2013-05-14 DIAGNOSIS — D649 Anemia, unspecified: Secondary | ICD-10-CM

## 2013-05-14 DIAGNOSIS — R0609 Other forms of dyspnea: Secondary | ICD-10-CM

## 2013-05-14 DIAGNOSIS — D509 Iron deficiency anemia, unspecified: Secondary | ICD-10-CM

## 2013-05-14 LAB — CBC WITH DIFFERENTIAL/PLATELET
BASO%: 0.7 % (ref 0.0–2.0)
EOS%: 0.1 % (ref 0.0–7.0)
Eosinophils Absolute: 0 10*3/uL (ref 0.0–0.5)
MCV: 79 fL — ABNORMAL LOW (ref 79.3–98.0)
MONO%: 2.2 % (ref 0.0–14.0)
NEUT#: 6.4 10*3/uL (ref 1.5–6.5)
RBC: 4.61 10*6/uL (ref 4.20–5.82)
RDW: 16.6 % — ABNORMAL HIGH (ref 11.0–14.6)
nRBC: 0 % (ref 0–0)

## 2013-05-14 MED ORDER — FERROUS SULFATE 325 (65 FE) MG PO TBEC: 325.0000 mg | DELAYED_RELEASE_TABLET | Freq: Two times a day (BID) | ORAL | Status: DC

## 2013-05-14 NOTE — Telephone Encounter (Signed)
Called in ferrous sulfate to pharmacy and pt .

## 2013-05-14 NOTE — Telephone Encounter (Signed)
GV AND PRINTED APPT SCHED AND AVS FORPT... °

## 2013-05-14 NOTE — Progress Notes (Signed)
   [facility]    OFFICE PROGRESS NOTE   INTERVAL HISTORY:   He returns as scheduled. He continues to have malaise. He denies bleeding. He reports 1 episode of emesis after eating at "[employer] ". He fishes occasionally. His chief complaint is orthostatic "dizziness ". He has vertigo symptoms when going from sitting to standing.  He underwent an upper endoscopy by Dr. Rhea Belton on 05/08/2013. No mass was noted. Abnormal mucosa with friability and bleeding was noted at the lesser curvature. He was diagnosed with Candida esophagitis. He was placed on Diflucan and antacid therapy.  Objective:  Vital signs in last 24 hours:  Blood pressure 89/47, pulse 108, temperature 98.1 F (36.7 C), temperature source Oral, resp. rate 17, height 5\' 6"  (1.676 m), weight 118 lb 3.2 oz (53.615 kg).  Blood pressure: Lying 100/58, pulse 92, sitting 76/47, pulse 94, standing 67/46, pulse 109 HEENT: The mucous membranes are moist. Mild whitecoat over the tongue. Lymphatics: No cervical or supraclavicular nodes. "Shotty "bilateral axillary nodes. Resp: Lungs with distant breath sounds, no respiratory distress Cardio: Regular rate and rhythm GI: No hepatomegaly, no mass, nontender Vascular: No leg edema   Lab Results:  Lab Results  Component Value Date   WBC 6.8 01/18/2013   HGB 12.1* 05/01/2013   HCT 33.0* 01/18/2013   MCV 73.7* 01/18/2013   PLT 300 01/18/2013   05/14/2013-hemoglobin 11.9, MCV 79, hematocrit 36.4%   Medications: I have reviewed the patient's current medications.  Assessment/Plan: 1. Adenocarcinoma of the stomach. Adenocarcinoma found in the gastric remnant status post previous partial gastrectomy/Billroth II anastomosis. No evidence of metastatic disease by physical exam or CT of the abdomen/pelvis. Staging EUS on 03/30/2012 confirmed a uT3 uN0 tumor. He began radiation and concurrent Xeloda chemotherapy on 04/18/2012. Xeloda was subsequently placed on hold due to chest  pain. Xeloda was resumed on 04/26/2012 with no further chest pain. The Xeloda dose was titrated to 1000 mg twice daily beginning 04/28/2012. He completed treatment on 05/30/2012. Restaging CT on 08/01/2012 with no evidence of metastatic disease. -Upper endoscopy 05/08/2013 with abnormal mucosa at the lesser curvature and no tumor mass seen, biopsy results pending 2. Remote partial gastrectomy/Billroth II anastomosis secondary to ulcer disease. 3. Iron deficiency anemia secondary to the gastric mass status post red cell transfusion 03/17/2012. The hemoglobin is stable, the MCV remains low 4. COPD. 5. History of colon polyps. 6. History of Chest pain/subxiphoid pain-most likely related to radiation esophagitis/gastritis or the gastric mass-he no longer has pain 7. Anorexia/weight loss-? Secondary to gastric cancer or COPD, his weight has increased. 8. Depression-he started a trial of Remeron on 06/13/2012, no longer taking Remeron 9. Dyspnea/low-grade fever at an office visit on 06/20/2012-? COPD flare,? Radiation pneumonitis-he completed a trial of prednisone. The symptoms partially improved. 10. Orthostasis-he continues to have orthostatic symptoms and blood pressure changes 11. History of Dysphagia-none today 12. Indeterminate 8 mm left liver lesion on the CT 08/01/2012.  Disposition:  There is no clinical evidence for progression of the gastric cancer. He continues to be symptomatic with orthostasis. I recommended he followup with Dr. Alphonsus Sias for further evaluation/treatment.? Trial of Florinef. I recommended he stand up slowly.  We will followup on the pathology from the 05/08/2013 endoscopy. He will return for an office visit in 3 months. We will recommend he resumed iron.   Thornton Papas, MD  05/14/2013  2:26 PM

## 2013-05-16 ENCOUNTER — Encounter: Payer: Self-pay | Admitting: Internal Medicine

## 2013-06-25 DIAGNOSIS — I2510 Atherosclerotic heart disease of native coronary artery without angina pectoris: Secondary | ICD-10-CM

## 2013-06-25 HISTORY — DX: Atherosclerotic heart disease of native coronary artery without angina pectoris: I25.10

## 2013-07-03 ENCOUNTER — Telehealth: Payer: Self-pay | Admitting: Internal Medicine

## 2013-07-03 NOTE — Telephone Encounter (Signed)
Spouse called in regarding chest pain. Reported patient is very weak. Refused to call 911. Transferred to speak with triage nurse, when triager answered call spouse did not respond to triager.

## 2013-07-03 NOTE — Telephone Encounter (Signed)
Spoke with pt and he said he feels OK now; had chest pain and heaviness 2-3 hours ago, pt said he took 2 ASA and that made him feel better. No N&V, Sweating,unusual SOB (pt has COPD), chest pain did not go into neck or arms.pt feels too weak to come to office today. Advised pt the importance of being evaluated today but pt said he will come tomorrow. Advised pt if his condition changes or worsens to go to [facility] or ED. Pt voiced understanding.Pt scheduled appt with Dr Alphonsus Sias 07/04/13 at 12 noon.

## 2013-07-03 NOTE — Telephone Encounter (Signed)
Okay  Will evaluate him tomorrow Advice was correct but he decided to stay home

## 2013-07-04 ENCOUNTER — Ambulatory Visit (INDEPENDENT_AMBULATORY_CARE_PROVIDER_SITE_OTHER): Payer: Medicare Other | Admitting: Internal Medicine

## 2013-07-04 ENCOUNTER — Encounter: Payer: Self-pay | Admitting: Internal Medicine

## 2013-07-04 VITALS — BP 110/60 | HR 62 | Temp 98.1°F | Wt 113.0 lb

## 2013-07-04 DIAGNOSIS — R079 Chest pain, unspecified: Secondary | ICD-10-CM

## 2013-07-04 NOTE — Progress Notes (Signed)
Subjective:    Patient ID: Danny Page, male    DOB: 04-28-1935, 78 y.o.   MRN: [id number]  HPI Wife here She called yesterday --he wouldn't  Had sensation yesterday of "something sitting" over mid chest Lasted 1 hour or so (maybe more) 4-5/10 intensity He was frightened No diaphoresis or nausea No dyspnea Occurred ~7AM---- after coffee with sugar and cream (doesn't eat breakfast)  Has felt normal since No recurrence of chest pain  Current Outpatient Prescriptions on File Prior to Visit  Medication Sig Dispense Refill  . chlorproMAZINE (THORAZINE) 25 MG tablet Take 1 tablet (25 mg total) by mouth 3 (three) times daily.  90 tablet  0  . ferrous sulfate 325 (65 FE) MG EC tablet Take 1 tablet (325 mg total) by mouth 2 (two) times daily.  60 tablet  0  . LORazepam (ATIVAN) 0.5 MG tablet PLACE ONE TABLET UNDER TONGUE EVERY 8 HOURS AS NEEDED FOR NAUSEA  60 tablet  2  . pantoprazole (PROTONIX) 40 MG tablet Take 1 tablet (40 mg total) by mouth daily.  90 tablet  3  . SPIRIVA HANDIHALER 18 MCG inhalation capsule INHALE ONE DOSE ONCE DAILY  30 capsule  3  . sucralfate (CARAFATE) 1 GM/10ML suspension Take 10 mLs (1 g total) by mouth 4 (four) times daily.  420 mL  1   No current facility-administered medications on file prior to visit.    No Known Allergies  Past Medical History  Diagnosis Date  . COPD (chronic obstructive pulmonary disease)   . Post herpetic neuralgia   . Hemorrhoids   . Anemia   . Stomach cancer     Past Surgical History  Procedure Laterality Date  . Stomach surgery      for ulcers, age 49  . Eus  03/30/2012    Procedure: UPPER ENDOSCOPIC ULTRASOUND (EUS) LINEAR;  Surgeon: Rachael Fee, MD;  Location: [facility];  Service: Endoscopy;  Laterality: N/A;    Family History  Problem Relation Age of Onset  . Heart disease Brother   . Cancer Mother 29    breast ca with mets to lymph nodes    History   Social History  . Marital Status: Married   Spouse Name: N/A    Number of Children: N/A  . Years of Education: N/A   Occupational History  . retired, Art gallery manager    Social History Main Topics  . Smoking status: Former Smoker -- 45 years    Types: Cigarettes    Quit date: 10/26/2003  . Smokeless tobacco: Never Used  . Alcohol Use: No     Comment: quit about 2004, heavy in the past  . Drug Use: No  . Sexual Activity: Not on file   Other Topics Concern  . Not on file   Social History Narrative   No living will   Requests wife as health care POA   Would accept resuscitation attempts   Would probably accept feeding tube   Review of Systems Appetite is poor---his usual No problems walking Still limited by his vertigo---has to be careful getting up Still gets hiccoughs     Objective:   Physical Exam  Constitutional: He appears well-developed and well-nourished. No distress.  Neck: Normal range of motion. Neck supple. No thyromegaly present.  Cardiovascular: Normal rate, regular rhythm and normal heart sounds.  Exam reveals no gallop.   No murmur heard. Pulmonary/Chest: Effort normal. No respiratory distress. He has no wheezes.  Decreased breath sounds Slight coarse basilar  crackles  Abdominal: Soft. There is no tenderness.  Musculoskeletal: He exhibits no edema and no tenderness.  Lymphadenopathy:    He has no cervical adenopathy.  Psychiatric: He has a normal mood and affect. His behavior is normal.          Assessment & Plan:

## 2013-07-04 NOTE — Patient Instructions (Signed)
Please go to Dr Jari Sportsman office at [facility] on [address] at 8:15AM tomorrow. Your appt is at 8:30AM

## 2013-07-04 NOTE — Assessment & Plan Note (Addendum)
His history is concerning for ischemia Refused emergency care yesterday No symptoms now and exam is benign EKG shows ST elevations inferiorly---but may be repolarization changes  Is taking ASA I am not excited about beta blocker with his lung disease [facility] cardiology office---Dr Kirke Corin can see him tomorrow morning

## 2013-07-05 ENCOUNTER — Encounter: Payer: Self-pay | Admitting: Cardiovascular Disease

## 2013-07-05 ENCOUNTER — Ambulatory Visit (INDEPENDENT_AMBULATORY_CARE_PROVIDER_SITE_OTHER): Payer: Medicare Other | Admitting: Cardiovascular Disease

## 2013-07-05 VITALS — BP 90/60 | HR 101 | Ht 66.0 in | Wt 112.5 lb

## 2013-07-05 DIAGNOSIS — R079 Chest pain, unspecified: Secondary | ICD-10-CM

## 2013-07-05 DIAGNOSIS — R9431 Abnormal electrocardiogram [ECG] [EKG]: Secondary | ICD-10-CM

## 2013-07-05 LAB — CK TOTAL AND CKMB (NOT AT ARMC): Total CK: 30 U/L (ref 7–232)

## 2013-07-05 NOTE — Assessment & Plan Note (Signed)
His symptoms are highly suggestive of unstable angina with prolonged episode of substernal chest tightness at rest. EKG showed minor ST elevation in the inferior leads which does not meet criteria for injury. The EKG changes appear to be better today. Cardiac enzymes were negative yesterday. He reports no further episodes but he tends to underreport symptoms .  I had a prolonged discussion with the patient about management options and recommended proceeding with urgent cardiac catheterization. However, the patient declined cardiac catheterization at this time and wants an alternative test. He feels that he is back to his normal self. I did discuss with him my concerns about the nature of his symptoms and the changes on his EKG but still he prefers not to proceed directly with cardiac catheterization. Given that he reports no further episodes and EKG changes appear to be better, I will proceed with a pharmacologic nuclear stress test tomorrow for risk stratification. Continue treatment with aspirin. I am not able to add a beta blocker due to baseline low blood pressure.

## 2013-07-05 NOTE — Patient Instructions (Addendum)
[facility] MYOVIEW  Your caregiver has ordered a Stress Test with nuclear imaging. The purpose of this test is to evaluate the blood supply to your heart muscle. This procedure is referred to as a "Non-Invasive Stress Test." This is because other than having an IV started in your vein, nothing is inserted or "invades" your body. Cardiac stress tests are done to find areas of poor blood flow to the heart by determining the extent of coronary artery disease (CAD). Some patients exercise on a treadmill, which naturally increases the blood flow to your heart, while others who are  unable to walk on a treadmill due to physical limitations have a pharmacologic/chemical stress agent called Lexiscan . This medicine will mimic walking on a treadmill by temporarily increasing your coronary blood flow.   Please note: these test may take anywhere between 2-4 hours to complete  PLEASE REPORT TO [facility]TR[facility]  THE VOLUNTEERS AT THE FIRST DESK WILL DIRECT YOU WHERE TO GO  Date of Procedure:___________Friday 9/12/14__________________________  Arrival Time for Procedure:___________9:45 am___________________  Instructions regarding medication:   ____ : Hold diabetes medication morning of procedure  ____:  Hold betablocker(s) night before procedure and morning of procedure  ____:  Hold other medications as follows:_________________________________________________________________________________________________________________________________________________________________________________________________________________________________________________________________________________________  PLEASE NOTIFY THE OFFICE AT LEAST 24 HOURS IN ADVANCE IF YOU ARE UNABLE TO KEEP YOUR APPOINTMENT.  [phone] AND  PLEASE NOTIFY NUCLEAR MEDICINE AT [facility] AT LEAST 24 HOURS IN ADVANCE IF YOU ARE UNABLE TO KEEP YOUR APPOINTMENT. [phone]  How to prepare for your Myoview test:  1. Do not eat or drink after  midnight 2. No caffeine for 24 hours prior to test 3. No smoking 24 hours prior to test. 4. Your medication may be taken with water.  If your doctor stopped a medication because of this test, do not take that medication. 5. Ladies, please do not wear dresses.  Skirts or pants are appropriate. Please wear a short sleeve shirt. 6. No perfume, cologne or lotion. 7. Wear comfortable walking shoes. No heels!

## 2013-07-05 NOTE — Progress Notes (Signed)
HPI  This is a 78 year old man who was referred by Dr. Alphonsus Sias for evaluation of chest pain. He has no previous cardiac history. He has no history of hypertension, diabetes or hyperlipidemia. He is a previous smoker quit about 5 years ago. He does have known history of COPD and he was treated for stomach cancer last year with radiation and chemotherapy and has been in remission. He is not aware of previous history of GERD although he is on medications for that. 2 days ago, he had a prolonged episode of substernal chest tightness and heaviness which lasted for about 2 hours. It was associated with shortness of breath and happened at rest. He did not seek emergent medical care. He was seen yesterday and was noted to have minor ST changes in the inferior leads. Labs showed normal CPK and CK-MB. He reports no further episodes since then. He does have dyspnea with minimal activities which he attributed to his COPD. 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   No Known Allergies   Current Outpatient Prescriptions on File Prior to Visit  Medication Sig Dispense Refill  . chlorproMAZINE (THORAZINE) 25 MG tablet Take 1 tablet (25 mg total) by mouth 3 (three) times daily.  90 tablet  0  . ferrous sulfate 325 (65 FE) MG EC tablet Take 1 tablet (325 mg total) by mouth 2 (two) times daily.  60 tablet  0  . LORazepam (ATIVAN) 0.5 MG tablet PLACE ONE TABLET UNDER TONGUE EVERY 8 HOURS AS NEEDED FOR NAUSEA  60 tablet  2  . pantoprazole (PROTONIX) 40 MG tablet Take 1 tablet (40 mg total) by mouth daily.  90 tablet  3  . SPIRIVA HANDIHALER 18 MCG inhalation capsule INHALE ONE DOSE ONCE DAILY  30 capsule  3  . sucralfate (CARAFATE) 1 GM/10ML suspension Take 10 mLs (1 g total) by mouth 4 (four) times daily.  420 mL  1   No current facility-administered medications on file prior to visit.     Past Medical History  Diagnosis Date  . COPD (chronic obstructive pulmonary disease)   . Post herpetic  neuralgia   . Hemorrhoids   . Anemia   . Stomach cancer     remission     Past Surgical History  Procedure Laterality Date  . Stomach surgery      for ulcers, age 60  . Eus  03/30/2012    Procedure: UPPER ENDOSCOPIC ULTRASOUND (EUS) LINEAR;  Surgeon: Rachael Fee, MD;  Location: [facility] END[facility];  Service: Endoscopy;  Laterality: N/A;     Family History  Problem Relation Age of Onset  . Heart disease Brother   . Cancer Mother 27    breast ca with mets to lymph nodes     History   Social History  . Marital Status: Married    Spouse Name: N/A    Number of Children: N/A  . Years of Education: N/A   Occupational History  . retired, Art gallery manager    Social History Main Topics  . Smoking status: Former Smoker -- 45 years    Types: Cigarettes    Quit date: 10/26/2003  . Smokeless tobacco: Never Used  . Alcohol Use: No     Comment: quit about 2004, heavy in the past  . Drug Use: No  . Sexual Activity: Not on file   Other Topics Concern  . Not on file   Social History Narrative   No living will   Requests wife as  health care POA   Would accept resuscitation attempts   Would probably accept feeding tube     ROS Constitutional: Negative for fever, chills, diaphoresis, activity change, appetite change and fatigue.  HENT: Negative for hearing loss, nosebleeds, congestion, sore throat, facial swelling, drooling, trouble swallowing, neck pain, voice change, sinus pressure and tinnitus.  Eyes: Negative for photophobia, pain, discharge and visual disturbance.  Respiratory: Negative for apnea, cough  and wheezing.  Cardiovascular: Negative for palpitations and leg swelling.  Gastrointestinal: Negative for nausea, vomiting, abdominal pain, diarrhea, constipation, blood in stool and abdominal distention.  Genitourinary: Negative for dysuria, urgency, frequency, hematuria and decreased urine volume.  Musculoskeletal: Negative for myalgias, back pain, joint swelling, arthralgias  and gait problem.  Skin: Negative for color change, pallor, rash and wound.  Neurological: Negative for dizziness, tremors, seizures, syncope, speech difficulty, weakness, light-headedness, numbness and headaches.  Psychiatric/Behavioral: Negative for suicidal ideas, hallucinations, behavioral problems and agitation. The patient is not nervous/anxious.     PHYSICAL EXAM   BP 90/60  Pulse 101  Ht 5\' 6"  (1.676 m)  Wt 112 lb 8 oz (51.03 kg)  BMI 18.17 kg/m2 Constitutional: He is oriented to person, place, and time. He appears well-developed and well-nourished. No distress.  HENT: No nasal discharge.  Head: Normocephalic and atraumatic.  Eyes: Pupils are equal and round. Right eye exhibits no discharge. Left eye exhibits no discharge.  Neck: Normal range of motion. Neck supple. No JVD present. No thyromegaly present.  Cardiovascular: Normal rate, regular rhythm, normal heart sounds and. Exam reveals no gallop and no friction rub. No murmur heard.  Pulmonary/Chest: Effort normal and breath sounds normal. No stridor. No respiratory distress. He has no wheezes. He has no rales. He exhibits no tenderness.  Abdominal: Soft. Bowel sounds are normal. He exhibits no distension. There is no tenderness. There is no rebound and no guarding.  Musculoskeletal: Normal range of motion. He exhibits no edema and no tenderness.  Neurological: He is alert and oriented to person, place, and time. Coordination normal.  Skin: Skin is warm and dry. No rash noted. He is not diaphoretic. No erythema. No pallor.  Psychiatric: He has a normal mood and affect. His behavior is normal. Judgment and thought content normal.       EKG: Sinus  Tachycardia  -Short PR syndrome  PRi = 116 Low voltage in precordial leads.   -Incomplete right bundle branch block and right axis -possible right ventricular hypertrophy.   -Inferior ST-elevation -repolarization variant.   -  Nonspecific T-abnormality.    ASSESSMENT AND  PLAN

## 2013-07-06 ENCOUNTER — Other Ambulatory Visit: Payer: Self-pay

## 2013-07-06 ENCOUNTER — Ambulatory Visit: Payer: Self-pay | Admitting: Cardiovascular Disease

## 2013-07-06 DIAGNOSIS — R079 Chest pain, unspecified: Secondary | ICD-10-CM

## 2013-07-06 DIAGNOSIS — R9431 Abnormal electrocardiogram [ECG] [EKG]: Secondary | ICD-10-CM

## 2013-07-10 ENCOUNTER — Telehealth: Payer: Self-pay

## 2013-07-10 NOTE — Telephone Encounter (Signed)
Message copied by Marilynne Halsted on Tue Jul 10, 2013  5:10 PM ------      Message from: Lorine Bears A      Created: Mon Jul 09, 2013  5:23 PM       Inform patient that  stress test was mildly abnormal. I tried calling him but no answer. I did not leave a message.       If he still having chest pain, we should proceed with cardiac cath. ------

## 2013-07-11 ENCOUNTER — Telehealth: Payer: Self-pay

## 2013-07-11 NOTE — Telephone Encounter (Signed)
Message copied by Marilynne Halsted on Wed Jul 11, 2013  8:17 AM ------      Message from: Lorine Bears A      Created: Mon Jul 09, 2013  5:23 PM       Inform patient that  stress test was mildly abnormal. I tried calling him but no answer. I did not leave a message.       If he still having chest pain, we should proceed with cardiac cath. ------

## 2013-07-11 NOTE — Telephone Encounter (Signed)
Spoke w/ sitter, who relayed results to pt.  Pt states that he has not had any pain "lately", but will call if it recurs and we will set up cardiac cath.

## 2013-07-19 ENCOUNTER — Telehealth: Payer: Self-pay

## 2013-07-19 NOTE — Telephone Encounter (Signed)
Spoke w/ pt.  He states that everything is "great" and he has not had any more chest pain. Does not want to scheduled cath at this time. Encouraged him to contact the office if he experiences any more chest pain and/or reconsiders setting up appt for cath.

## 2013-08-14 ENCOUNTER — Encounter (INDEPENDENT_AMBULATORY_CARE_PROVIDER_SITE_OTHER): Payer: Self-pay

## 2013-08-14 ENCOUNTER — Ambulatory Visit (HOSPITAL_BASED_OUTPATIENT_CLINIC_OR_DEPARTMENT_OTHER): Payer: Medicare Other | Admitting: Oncology

## 2013-08-14 VITALS — BP 110/55 | HR 71 | Temp 98.3°F | Resp 18 | Ht 66.0 in | Wt 115.4 lb

## 2013-08-14 DIAGNOSIS — D508 Other iron deficiency anemias: Secondary | ICD-10-CM

## 2013-08-14 DIAGNOSIS — D649 Anemia, unspecified: Secondary | ICD-10-CM

## 2013-08-14 DIAGNOSIS — C160 Malignant neoplasm of cardia: Secondary | ICD-10-CM

## 2013-08-14 DIAGNOSIS — C169 Malignant neoplasm of stomach, unspecified: Secondary | ICD-10-CM

## 2013-08-14 NOTE — Progress Notes (Signed)
   [facility]    OFFICE PROGRESS NOTE   INTERVAL HISTORY:   She returns for scheduled followup gastric cancer. He continues to have a low activity level. Stable exertional dyspnea. No bleeding. He reports increased flatus and hiccups. No nausea or vomiting. He is not taking iron. He had one episode of chest pain.  Objective:  Vital signs in last 24 hours:  Blood pressure 110/55, pulse 71, temperature 98.3 F (36.8 C), temperature source Oral, resp. rate 18, height 5\' 6"  (1.676 m), weight 115 lb 6.4 oz (52.345 kg).    HEENT: Neck without mass Lymphatics: No cervical, supraclavicular, or axillary nodes Resp: Distant breath sounds with end inspiratory rhonchi at the posterior base bilaterally, no respiratory distress Cardio: Regular rate and rhythm GI: No hepatomegaly, nontender, no mass Vascular: No leg edema    Medications: I have reviewed the patient's current medications.  Assessment/Plan: 1. Adenocarcinoma of the stomach. Adenocarcinoma found in the gastric remnant status post previous partial gastrectomy/Billroth II anastomosis. No evidence of metastatic disease by physical exam or CT of the abdomen/pelvis. Staging EUS on 03/30/2012 confirmed a uT3 uN0 tumor. He began radiation and concurrent Xeloda chemotherapy on 04/18/2012. Xeloda was subsequently placed on hold due to chest pain. Xeloda was resumed on 04/26/2012 with no further chest pain. The Xeloda dose was titrated to 1000 mg twice daily beginning 04/28/2012. He completed treatment on 05/30/2012. Restaging CT on 08/01/2012 with no evidence of metastatic disease. -Upper endoscopy 05/08/2013 with abnormal mucosa at the lesser curvature and no tumor mass seen, biopsy negative for malignancy 2. Remote partial gastrectomy/Billroth II anastomosis secondary to ulcer disease. 3. Iron deficiency anemia secondary to the gastric mass status post red cell transfusion 03/17/2012. I encouraged him to resume ferrous  sulfate  4. COPD. 5. History of colon polyps. 6. History of Chest pain/subxiphoid pain-most likely related to radiation esophagitis/gastritis or the gastric mass 7. Anorexia/weight loss-? Secondary to gastric cancer or COPD, stable 8. Depression-he started a trial of Remeron on 06/13/2012, no longer taking Remeron 9. Dyspnea/low-grade fever at an office visit on 06/20/2012-? COPD flare,? Radiation pneumonitis-he completed a trial of prednisone. The symptoms partially improved. 10. History of Orthostasis 11. History of Dysphagia-none today 12. Indeterminate 8 mm left liver lesion on the CT 08/01/2012.   Disposition:  Mr. Hietala appears stable. No clinical evidence for progression of the gastric cancer. He will resume ferrous sulfate. Mr. Buzby will return for an office visit and CBC in 3 months. He declined an influenza vaccine.   Thornton Papas, MD  08/14/2013  4:30 PM

## 2013-08-15 ENCOUNTER — Telehealth: Payer: Self-pay | Admitting: Oncology

## 2013-08-15 NOTE — Telephone Encounter (Signed)
sw pt made aware of 11/15/13 appts cal mailed per pt req shh

## 2013-09-10 ENCOUNTER — Other Ambulatory Visit: Payer: Self-pay | Admitting: Internal Medicine

## 2013-10-21 ENCOUNTER — Other Ambulatory Visit: Payer: Self-pay | Admitting: Internal Medicine

## 2013-11-16 ENCOUNTER — Telehealth: Payer: Self-pay | Admitting: Oncology

## 2013-11-16 NOTE — Telephone Encounter (Signed)
Gave pt appt for lab and MD on 11/20/13

## 2013-11-20 ENCOUNTER — Other Ambulatory Visit (HOSPITAL_BASED_OUTPATIENT_CLINIC_OR_DEPARTMENT_OTHER): Payer: Medicare Other

## 2013-11-20 ENCOUNTER — Other Ambulatory Visit: Payer: Self-pay | Admitting: *Deleted

## 2013-11-20 ENCOUNTER — Ambulatory Visit (HOSPITAL_BASED_OUTPATIENT_CLINIC_OR_DEPARTMENT_OTHER): Payer: Medicare Other | Admitting: Oncology

## 2013-11-20 VITALS — BP 96/55 | HR 88 | Temp 97.4°F | Resp 18 | Ht 66.0 in | Wt 115.7 lb

## 2013-11-20 DIAGNOSIS — K769 Liver disease, unspecified: Secondary | ICD-10-CM

## 2013-11-20 DIAGNOSIS — C169 Malignant neoplasm of stomach, unspecified: Secondary | ICD-10-CM

## 2013-11-20 DIAGNOSIS — D509 Iron deficiency anemia, unspecified: Secondary | ICD-10-CM

## 2013-11-20 DIAGNOSIS — J449 Chronic obstructive pulmonary disease, unspecified: Secondary | ICD-10-CM

## 2013-11-20 DIAGNOSIS — D649 Anemia, unspecified: Secondary | ICD-10-CM

## 2013-11-20 DIAGNOSIS — C160 Malignant neoplasm of cardia: Secondary | ICD-10-CM

## 2013-11-20 LAB — CBC WITH DIFFERENTIAL/PLATELET
BASO%: 2.1 % — ABNORMAL HIGH (ref 0.0–2.0)
Basophils Absolute: 0.2 10*3/uL — ABNORMAL HIGH (ref 0.0–0.1)
EOS ABS: 0.1 10*3/uL (ref 0.0–0.5)
EOS%: 1.1 % (ref 0.0–7.0)
HCT: 41.3 % (ref 38.4–49.9)
HGB: 13.7 g/dL (ref 13.0–17.1)
LYMPH#: 2.8 10*3/uL (ref 0.9–3.3)
LYMPH%: 32.7 % (ref 14.0–49.0)
MCH: 28.5 pg (ref 27.2–33.4)
MCHC: 33 g/dL (ref 32.0–36.0)
MCV: 86.2 fL (ref 79.3–98.0)
MONO#: 0.8 10*3/uL (ref 0.1–0.9)
MONO%: 9 % (ref 0.0–14.0)
NEUT%: 55.1 % (ref 39.0–75.0)
NEUTROS ABS: 4.6 10*3/uL (ref 1.5–6.5)
Platelets: 217 10*3/uL (ref 140–400)
RBC: 4.79 10*6/uL (ref 4.20–5.82)
RDW: 14.9 % — AB (ref 11.0–14.6)
WBC: 8.4 10*3/uL (ref 4.0–10.3)

## 2013-11-20 MED ORDER — LORAZEPAM 0.5 MG PO TABS: ORAL_TABLET | ORAL | Status: DC

## 2013-11-20 NOTE — Progress Notes (Signed)
   D[facility]    OFFICE PROGRESS NOTE   INTERVAL HISTORY:   He returns for scheduled followup of gastric cancer. He denies pain and dysphagia. His appetite remains poor. He stays in a house most of the time. He continues to have orthostatic symptoms. He complains of tinnitus. He is taking iron once daily. He complains of intermittent hiccups.  Objective:  Vital signs in last 24 hours:  Blood pressure 96/55, pulse 88, temperature 97.4 F (36.3 C), temperature source Oral, resp. rate 18, height 5\' 6"  (1.676 m), weight 115 lb 11.2 oz (52.481 kg), SpO2 98.00%.    HEENT: Neck without mass Lymphatics: No cervical, supraclavicular, or axillary nodes Resp: Distant breath sounds, end inspiratory rhonchi at the posterior base bilaterally, no respiratory distress Cardio: Regular rate and rhythm GI: No hepatomegaly, nontender, no mass Vascular: No leg edema   Lab Results:  Lab Results  Component Value Date   WBC 8.4 11/20/2013   HGB 13.7 11/20/2013   HCT 41.3 11/20/2013   MCV 86.2 11/20/2013   PLT 217 11/20/2013   NEUTROABS 4.6 11/20/2013      Medications: I have reviewed the patient's current medications.  Assessment/Plan: 1. Adenocarcinoma of the stomach. Adenocarcinoma found in the gastric remnant status post previous partial gastrectomy/Billroth II anastomosis. No evidence of metastatic disease by physical exam or CT of the abdomen/pelvis. Staging EUS on 03/30/2012 confirmed a uT3 uN0 tumor. He began radiation and concurrent Xeloda chemotherapy on 04/18/2012. Xeloda was subsequently placed on hold due to chest pain. Xeloda was resumed on 04/26/2012 with no further chest pain. The Xeloda dose was titrated to 1000 mg twice daily beginning 04/28/2012. He completed treatment on 05/30/2012. Restaging CT on 08/01/2012 with no evidence of metastatic disease. -Upper endoscopy 05/08/2013 with abnormal mucosa at the lesser curvature and no tumor mass seen, biopsy negative for  malignancy  2. Remote partial gastrectomy/Billroth II anastomosis secondary to ulcer disease. 3. Iron deficiency anemia secondary to the gastric mass status post red cell transfusion 03/17/2012. The hemoglobin is better, he will continue iron  4. COPD. 5. History of colon polyps. 6. History of Chest pain/subxiphoid pain-most likely related to radiation esophagitis/gastritis or the gastric mass 7. Anorexia/weight loss-? Secondary to gastric cancer or COPD, stable 8. Depression-he started a trial of Remeron on 06/13/2012, no longer taking Remeron 9. Dyspnea/low-grade fever at an office visit on 06/20/2012-? COPD flare,? Radiation pneumonitis-he completed a trial of prednisone. The symptoms partially improved. 10. History of Orthostasis 11. History of Dysphagia-none today 12. Indeterminate 8 mm left liver lesion on the CT 08/01/2012.     Disposition:  He remains in clinical remission from gastric cancer. He continues to appear depressed with a poor performance status. He declines an antidepressant. I recommended he continue once daily ferrous sulfate. He will return for an office and lab visit in 4 months. Danny Page will contact us in the interim for new symptoms.   [name], MD  11/20/2013  11:49 AM

## 2013-11-22 ENCOUNTER — Telehealth: Payer: Self-pay | Admitting: Oncology

## 2013-11-22 ENCOUNTER — Other Ambulatory Visit: Payer: Self-pay | Admitting: Internal Medicine

## 2013-11-22 NOTE — Telephone Encounter (Signed)
Pt left v/m requesting refill spiriva to [place].Please advise. Pt was seen 08/31/12 and 07/04/13 with chest pain.

## 2013-11-22 NOTE — Telephone Encounter (Signed)
s.w pt and advised on May appt.....pt ok and aware °

## 2013-11-25 ENCOUNTER — Other Ambulatory Visit: Payer: Self-pay | Admitting: Internal Medicine

## 2014-01-02 ENCOUNTER — Ambulatory Visit (INDEPENDENT_AMBULATORY_CARE_PROVIDER_SITE_OTHER): Payer: Medicare Other | Admitting: Internal Medicine

## 2014-01-02 ENCOUNTER — Encounter: Payer: Self-pay | Admitting: Internal Medicine

## 2014-01-02 VITALS — BP 100/60 | HR 86 | Temp 97.8°F | Ht 66.0 in | Wt 113.5 lb

## 2014-01-02 DIAGNOSIS — I2510 Atherosclerotic heart disease of native coronary artery without angina pectoris: Secondary | ICD-10-CM | POA: Insufficient documentation

## 2014-01-02 DIAGNOSIS — Z0000 Encounter for general adult medical examination without abnormal findings: Secondary | ICD-10-CM | POA: Insufficient documentation

## 2014-01-02 DIAGNOSIS — J449 Chronic obstructive pulmonary disease, unspecified: Secondary | ICD-10-CM

## 2014-01-02 DIAGNOSIS — C169 Malignant neoplasm of stomach, unspecified: Secondary | ICD-10-CM

## 2014-01-02 DIAGNOSIS — B0229 Other postherpetic nervous system involvement: Secondary | ICD-10-CM

## 2014-01-02 LAB — COMPREHENSIVE METABOLIC PANEL
ALT: 11 U/L (ref 0–53)
AST: 18 U/L (ref 0–37)
Albumin: 4.2 g/dL (ref 3.5–5.2)
Alkaline Phosphatase: 113 U/L (ref 39–117)
BUN: 15 mg/dL (ref 6–23)
CALCIUM: 9.3 mg/dL (ref 8.4–10.5)
CHLORIDE: 104 meq/L (ref 96–112)
CO2: 30 meq/L (ref 19–32)
CREATININE: 1 mg/dL (ref 0.4–1.5)
GFR: 73.72 mL/min (ref >60.00)
Glucose, Bld: 85 mg/dL (ref 70–99)
Potassium: 4.2 mEq/L (ref 3.5–5.1)
Sodium: 139 mEq/L (ref 135–145)
Total Bilirubin: 0.4 mg/dL (ref 0.3–1.2)
Total Protein: 7.3 g/dL (ref 6.0–8.3)

## 2014-01-02 LAB — LIPID PANEL
CHOLESTEROL: 214 mg/dL — AB (ref 0–200)
HDL: 46.2 mg/dL (ref >39.00)
LDL Cholesterol: 149 mg/dL — ABNORMAL HIGH (ref 0–99)
Total CHOL/HDL Ratio: 5
Triglycerides: 92 mg/dL (ref 0.0–149.0)
VLDL: 18.4 mg/dL (ref 0.0–40.0)

## 2014-01-02 LAB — TSH: TSH: 0.34 u[IU]/mL — ABNORMAL LOW (ref 0.35–5.50)

## 2014-01-02 LAB — T4, FREE: FREE T4: 1.12 ng/dL (ref 0.60–1.60)

## 2014-01-02 NOTE — Progress Notes (Signed)
Pre visit review using our clinic review tool, if applicable. No additional management support is needed unless otherwise documented below in the visit note. 

## 2014-01-02 NOTE — Progress Notes (Signed)
Subjective:    Patient ID: Danny Page, male    DOB: Aug 11, 1935, 79 y.o.   MRN: [id number]  HPI Here with wife Here for physical and Medicare wellness Reviewed his form No tobacco or alcohol Sees Dr Benay Spice Occasionally walks but not much Needs help with showering---wife washes back. She does all the housework, etc Still drives some--wife handles things though No significant hearing or vision problems No memory issues--he states. Forgets somethings that his wife tells him  Still sees Dr Learta Codding Gastric cancer in remission No active Rx now  Hadn't heard about the stress test No further chest pain No palpitations He doesn't want meds for this  Stable DOE from his COPD Continues on the inhaler  Appetite is still not good---wife notes---he thinks it is okay No supplements Weight is fairly stable Current Outpatient Prescriptions on File Prior to Visit  Medication Sig Dispense Refill  . aspirin 81 MG tablet Take 81 mg by mouth daily.      Marland Kitchen LORazepam (ATIVAN) 0.5 MG tablet PLACE ONE TABLET UNDER TONGUE EVERY 8 HOURS AS NEEDED FOR NAUSEA  60 tablet  1  . pantoprazole (PROTONIX) 40 MG tablet Take 1 tablet (40 mg total) by mouth daily.  90 tablet  3  . SPIRIVA HANDIHALER 18 MCG inhalation capsule INHALE ONE DOSE ONCE DAILY  30 capsule  0  . chlorproMAZINE (THORAZINE) 25 MG tablet Take 1 tablet (25 mg total) by mouth 3 (three) times daily.  90 tablet  0   No current facility-administered medications on file prior to visit.    No Known Allergies  Past Medical History  Diagnosis Date  . COPD (chronic obstructive pulmonary disease)   . Post herpetic neuralgia   . Hemorrhoids   . Anemia   . Stomach cancer     remission  . CAD (coronary artery disease) 9/14    mildly abnormal perfusion study    Past Surgical History  Procedure Laterality Date  . Stomach surgery      for ulcers, age 64  . Eus  03/30/2012    Procedure: UPPER ENDOSCOPIC ULTRASOUND (EUS) LINEAR;   Surgeon: Milus Banister, MD;  Location: [facility];  Service: Endoscopy;  Laterality: N/A;    Family History  Problem Relation Age of Onset  . Heart disease Brother   . Cancer Mother 69    breast ca with mets to lymph nodes    History   Social History  . Marital Status: Married    Spouse Name: N/A    Number of Children: N/A  . Years of Education: N/A   Occupational History  . retired, Chief Financial Officer    Social History Main Topics  . Smoking status: Former Smoker -- 45 years    Types: Cigarettes    Quit date: 10/26/2003  . Smokeless tobacco: Never Used  . Alcohol Use: No     Comment: quit about 2004, heavy in the past  . Drug Use: No  . Sexual Activity: Not on file   Other Topics Concern  . Not on file   Social History Narrative   No living will   Requests wife as health care POA   Would accept resuscitation attempts   Would probably accept feeding tube   Review of Systems  Constitutional: Positive for fatigue. Negative for unexpected weight change.       Wears seat belt Wife notes poor energy  HENT: Negative for dental problem and tinnitus.        Regular  with dentist  Eyes: Negative for visual disturbance.       No diplopia or unilateral vision loss  Respiratory: Positive for cough and shortness of breath. Negative for chest tightness.   Cardiovascular: Negative for chest pain, palpitations and leg swelling.  Gastrointestinal: Negative for nausea, vomiting, abdominal pain, constipation and blood in stool.       No heartburn  Endocrine: Negative for cold intolerance and heat intolerance.  Genitourinary: Negative for urgency, frequency and difficulty urinating.  Musculoskeletal: Negative for arthralgias, back pain and joint swelling.  Skin: Negative for rash.       No suspicious areas  Allergic/Immunologic: Negative for environmental allergies and immunocompromised state.  Neurological: Positive for dizziness. Negative for syncope, light-headedness and headaches.    Hematological: Negative for adenopathy. Does not bruise/bleed easily.  Psychiatric/Behavioral: Negative for sleep disturbance and dysphoric mood. The patient is not nervous/anxious.        Objective:   Physical Exam  Constitutional: He is oriented to person, place, and time. He appears well-developed. No distress.  HENT:  Head: Normocephalic and atraumatic.  Right Ear: External ear normal.  Left Ear: External ear normal.  Mouth/Throat: Oropharynx is clear and moist. No oropharyngeal exudate.  Eyes: Conjunctivae and EOM are normal. Pupils are equal, round, and reactive to light.  Neck: Normal range of motion. Neck supple. No thyromegaly present.  Cardiovascular: Normal rate, regular rhythm, normal heart sounds and intact distal pulses.  Exam reveals no gallop.   No murmur heard. Pulmonary/Chest: Effort normal and breath sounds normal. No respiratory distress. He has no wheezes. He has no rales.  Abdominal: Soft. There is no tenderness.  Musculoskeletal: He exhibits no edema and no tenderness.  Neurological: He is alert and oriented to person, place, and time.  President-- "Obama, Bush, Bush--then Clinton" (with prompt) [phone] [id number] Recall 0/3  Skin: No rash noted. No erythema.  Psychiatric: He has a normal mood and affect. His behavior is normal.  No complaints--denies issues wife bringsup          Assessment & Plan:

## 2014-01-02 NOTE — Assessment & Plan Note (Signed)
Mild symptoms only 

## 2014-01-02 NOTE — Assessment & Plan Note (Signed)
Some degree of disability and lack of appetite Stable on inhaler

## 2014-01-02 NOTE — Assessment & Plan Note (Signed)
Unconfirmed diagnosis from stress test No further symptoms He doesn't want any meds for this

## 2014-01-02 NOTE — Assessment & Plan Note (Signed)
Follows with Dr Benay Spice

## 2014-01-02 NOTE — Assessment & Plan Note (Signed)
I have personally reviewed the Medicare Annual Wellness questionnaire and have noted 1. The patient's medical and social history 2. Their use of alcohol, tobacco or illicit drugs 3. Their current medications and supplements 4. The patient's functional ability including ADL's, fall risks, home safety risks and hearing or visual             impairment. 5. Diet and physical activities 6. Evidence for depression or mood disorders  The patients weight, height, BMI and visual acuity have been recorded in the chart I have made referrals, counseling and provided education to the patient based review of the above and I have provided the pt with a written personalized care plan for preventive services.  I have provided you with a copy of your personalized plan for preventive services. Please take the time to review along with your updated medication list.  No cancer screening Refuses all immunizations Needs to eat more--discussed

## 2014-01-03 ENCOUNTER — Telehealth: Payer: Self-pay | Admitting: Internal Medicine

## 2014-01-03 NOTE — Telephone Encounter (Signed)
Relevant patient education mailed to patient.  

## 2014-01-07 ENCOUNTER — Encounter: Payer: Self-pay | Admitting: Family Medicine

## 2014-01-23 ENCOUNTER — Other Ambulatory Visit: Payer: Self-pay | Admitting: Internal Medicine

## 2014-02-24 ENCOUNTER — Other Ambulatory Visit: Payer: Self-pay | Admitting: Internal Medicine

## 2014-03-19 ENCOUNTER — Other Ambulatory Visit (HOSPITAL_BASED_OUTPATIENT_CLINIC_OR_DEPARTMENT_OTHER): Payer: Medicare Other

## 2014-03-19 ENCOUNTER — Ambulatory Visit (HOSPITAL_BASED_OUTPATIENT_CLINIC_OR_DEPARTMENT_OTHER): Payer: Medicare Other | Admitting: Oncology

## 2014-03-19 ENCOUNTER — Telehealth: Payer: Self-pay | Admitting: Oncology

## 2014-03-19 VITALS — BP 98/54 | HR 86 | Temp 98.3°F | Resp 18 | Ht 66.0 in | Wt 111.9 lb

## 2014-03-19 DIAGNOSIS — D649 Anemia, unspecified: Secondary | ICD-10-CM

## 2014-03-19 DIAGNOSIS — K7689 Other specified diseases of liver: Secondary | ICD-10-CM

## 2014-03-19 DIAGNOSIS — R634 Abnormal weight loss: Secondary | ICD-10-CM

## 2014-03-19 DIAGNOSIS — J4489 Other specified chronic obstructive pulmonary disease: Secondary | ICD-10-CM

## 2014-03-19 DIAGNOSIS — J449 Chronic obstructive pulmonary disease, unspecified: Secondary | ICD-10-CM

## 2014-03-19 DIAGNOSIS — C169 Malignant neoplasm of stomach, unspecified: Secondary | ICD-10-CM

## 2014-03-19 DIAGNOSIS — D509 Iron deficiency anemia, unspecified: Secondary | ICD-10-CM

## 2014-03-19 DIAGNOSIS — L98499 Non-pressure chronic ulcer of skin of other sites with unspecified severity: Secondary | ICD-10-CM

## 2014-03-19 DIAGNOSIS — C160 Malignant neoplasm of cardia: Secondary | ICD-10-CM

## 2014-03-19 DIAGNOSIS — R630 Anorexia: Secondary | ICD-10-CM

## 2014-03-19 LAB — CBC WITH DIFFERENTIAL/PLATELET
BASO%: 1.4 % (ref 0.0–2.0)
BASOS ABS: 0.1 10*3/uL (ref 0.0–0.1)
EOS%: 0.8 % (ref 0.0–7.0)
Eosinophils Absolute: 0.1 10*3/uL (ref 0.0–0.5)
HEMATOCRIT: 39.2 % (ref 38.4–49.9)
HEMOGLOBIN: 12.6 g/dL — AB (ref 13.0–17.1)
LYMPH#: 2.5 10*3/uL (ref 0.9–3.3)
LYMPH%: 29.5 % (ref 14.0–49.0)
MCH: 27.7 pg (ref 27.2–33.4)
MCHC: 32.3 g/dL (ref 32.0–36.0)
MCV: 85.7 fL (ref 79.3–98.0)
MONO#: 0.6 10*3/uL (ref 0.1–0.9)
MONO%: 6.9 % (ref 0.0–14.0)
NEUT#: 5.2 10*3/uL (ref 1.5–6.5)
NEUT%: 61.4 % (ref 39.0–75.0)
PLATELETS: 229 10*3/uL (ref 140–400)
RBC: 4.57 10*6/uL (ref 4.20–5.82)
RDW: 14.2 % (ref 11.0–14.6)
WBC: 8.5 10*3/uL (ref 4.0–10.3)

## 2014-03-19 MED ORDER — ALBUTEROL SULFATE HFA 108 (90 BASE) MCG/ACT IN AERS: 2.0000 | INHALATION_SPRAY | Freq: Four times a day (QID) | RESPIRATORY_TRACT | Status: DC | PRN

## 2014-03-19 MED ORDER — TIOTROPIUM BROMIDE MONOHYDRATE 18 MCG IN CAPS: 18.0000 ug | ORAL_CAPSULE | Freq: Every day | RESPIRATORY_TRACT | Status: DC

## 2014-03-19 NOTE — Telephone Encounter (Signed)
gv adn printed appt sched adn avs for pt; for Sept °

## 2014-03-19 NOTE — Progress Notes (Signed)
  [name]   Diagnosis: Gastric cancer  INTERVAL HISTORY:   He returns as scheduled. He continues to have a poor appetite. No dysphagia. No bleeding. No pain. Stable exertional dyspnea. He uses inhalers. He request a refill on Spiriva and a albuterol inhaler.  Objective:  Vital signs in last 24 hours:  Blood pressure 98/54, pulse 86, temperature 98.3 F (36.8 C), temperature source Oral, resp. rate 18, height 5\' 6"  (1.676 m), weight 111 lb 14.4 oz (50.758 kg), SpO2 96.00%.    HEENT: Neck without mass Lymphatics: No cervical or supraclavicular nodes. "Shotty "bilateral axillary nodes. Resp: Lungs with inspiratory coarse rhonchi at the right greater than left posterior base, distant breath sounds, no respiratory distress Cardio: Regular rate and rhythm GI: No hepatomegaly, nontender, no mass Vascular: No leg edema   Lab Results:  Lab Results  Component Value Date   WBC 8.5 03/19/2014   HGB 12.6* 03/19/2014   HCT 39.2 03/19/2014   MCV 85.7 03/19/2014   PLT 229 03/19/2014   NEUTROABS 5.2 03/19/2014     Medications: I have reviewed the patient's current medications.  Assessment/Plan: 1. Adenocarcinoma of the stomach. Adenocarcinoma found in the gastric remnant status post previous partial gastrectomy/Billroth II anastomosis. No evidence of metastatic disease by physical exam or CT of the abdomen/pelvis. Staging EUS on 03/30/2012 confirmed a uT3 uN0 tumor. He began radiation and concurrent Xeloda chemotherapy on 04/18/2012. Xeloda was subsequently placed on hold due to chest pain. Xeloda was resumed on 04/26/2012 with no further chest pain. The Xeloda dose was titrated to 1000 mg twice daily beginning 04/28/2012. He completed treatment on 05/30/2012. Restaging CT on 08/01/2012 with no evidence of metastatic disease. -Upper endoscopy 05/08/2013 with abnormal mucosa at the lesser curvature and no tumor mass seen, biopsy negative for malignancy   2. Remote partial gastrectomy/Billroth II anastomosis secondary to ulcer disease. 3. Iron deficiency anemia secondary to the gastric mass status post red cell transfusion 03/17/2012.  4. COPD. 5. History of colon polyps. 6. History of Chest pain/subxiphoid pain-most likely related to radiation esophagitis/gastritis or the gastric mass 7. Anorexia/weight loss-? Secondary to gastric cancer or COPD, stable 8. Depression-he started a trial of Remeron on 06/13/2012, no longer taking Remeron 9. Dyspnea/low-grade fever at an office visit on 06/20/2012-? COPD flare,? Radiation pneumonitis-he completed a trial of prednisone. The symptoms partially improved. 10. History of Orthostasis 11. History of Dysphagia-none today 12. Indeterminate 8 mm left liver lesion on the CT 08/01/2012.   Disposition:  [name] appears stable. No clinical evidence of progressive gastric cancer. He will return for an office visit in 4 months.  Ladell Pier, MD  03/19/2014  3:40 PM

## 2014-03-21 ENCOUNTER — Other Ambulatory Visit: Payer: Self-pay | Admitting: Oncology

## 2014-03-21 ENCOUNTER — Telehealth: Payer: Self-pay | Admitting: Internal Medicine

## 2014-03-21 NOTE — Telephone Encounter (Signed)
Message copied by Jerene Bears on Thu Mar 21, 2014  8:46 AM ------      Message from: Ladell Pier      Created: Tue Mar 19, 2014  9:50 PM      Regarding: RE: Repeat EGD       There is no clinical evidence of progressive cancer.      I think observation with a repeat EGD for symptoms or progressive anemia is ok.            Thanks,                  Leroy Sea      ----- Message -----         From: Jerene Bears, MD         Sent: 03/19/2014   4:00 PM           To: Ladell Pier, MD      Subject: Repeat EGD                                               Leroy Sea      I had a reminder for a recall EGD on [name] given his history of gastric cancer      By chance it seems you are seeing him today      Please let me know your thoughts regarding repeat surveillance EGD      Thanks            Danny Page             ------

## 2014-03-21 NOTE — Telephone Encounter (Signed)
1 year recall for EGD in the setting of gastric cancer Per correspondence with Dr. Benay Spice will perform EGD in the event of symptoms or progressive anemia Dr. Benay Spice can contact me/our office should the need for repeat EGD arise

## 2014-04-03 ENCOUNTER — Encounter: Payer: Self-pay | Admitting: Internal Medicine

## 2014-04-25 ENCOUNTER — Other Ambulatory Visit: Payer: Self-pay | Admitting: Oncology

## 2014-04-25 DIAGNOSIS — J449 Chronic obstructive pulmonary disease, unspecified: Secondary | ICD-10-CM

## 2014-04-29 ENCOUNTER — Other Ambulatory Visit: Payer: Self-pay | Admitting: *Deleted

## 2014-04-29 ENCOUNTER — Other Ambulatory Visit: Payer: Self-pay | Admitting: Oncology

## 2014-04-29 DIAGNOSIS — C169 Malignant neoplasm of stomach, unspecified: Secondary | ICD-10-CM

## 2014-04-29 MED ORDER — TIOTROPIUM BROMIDE MONOHYDRATE 18 MCG IN CAPS: 18.0000 ug | ORAL_CAPSULE | Freq: Every day | RESPIRATORY_TRACT | Status: DC

## 2014-04-29 NOTE — Telephone Encounter (Signed)
Received faxed refill request from pharmacy. Refill sent to pharmacy electronically. 

## 2014-05-30 ENCOUNTER — Other Ambulatory Visit: Payer: Self-pay | Admitting: *Deleted

## 2014-05-30 ENCOUNTER — Other Ambulatory Visit: Payer: Self-pay | Admitting: Oncology

## 2014-05-30 MED ORDER — LORAZEPAM 0.5 MG PO TABS: 0.5000 mg | ORAL_TABLET | Freq: Three times a day (TID) | ORAL | Status: DC | PRN

## 2014-05-30 NOTE — Telephone Encounter (Signed)
Call from pt requesting refill on Lorazepam. OK, per Dr. Benay Spice. Rx called to pharmacy.

## 2014-07-09 ENCOUNTER — Ambulatory Visit (HOSPITAL_BASED_OUTPATIENT_CLINIC_OR_DEPARTMENT_OTHER): Payer: Medicare Other | Admitting: Oncology

## 2014-07-09 ENCOUNTER — Telehealth: Payer: Self-pay | Admitting: Oncology

## 2014-07-09 ENCOUNTER — Other Ambulatory Visit (HOSPITAL_BASED_OUTPATIENT_CLINIC_OR_DEPARTMENT_OTHER): Payer: Medicare Other

## 2014-07-09 VITALS — BP 100/57 | HR 84 | Temp 98.2°F | Resp 18 | Ht 66.0 in | Wt 106.3 lb

## 2014-07-09 DIAGNOSIS — C169 Malignant neoplasm of stomach, unspecified: Secondary | ICD-10-CM

## 2014-07-09 DIAGNOSIS — R630 Anorexia: Secondary | ICD-10-CM

## 2014-07-09 DIAGNOSIS — D500 Iron deficiency anemia secondary to blood loss (chronic): Secondary | ICD-10-CM

## 2014-07-09 DIAGNOSIS — C160 Malignant neoplasm of cardia: Secondary | ICD-10-CM

## 2014-07-09 DIAGNOSIS — F329 Major depressive disorder, single episode, unspecified: Secondary | ICD-10-CM

## 2014-07-09 DIAGNOSIS — F3289 Other specified depressive episodes: Secondary | ICD-10-CM

## 2014-07-09 DIAGNOSIS — J4489 Other specified chronic obstructive pulmonary disease: Secondary | ICD-10-CM

## 2014-07-09 DIAGNOSIS — J449 Chronic obstructive pulmonary disease, unspecified: Secondary | ICD-10-CM

## 2014-07-09 LAB — CBC WITH DIFFERENTIAL/PLATELET
BASO%: 2.3 % — ABNORMAL HIGH (ref 0.0–2.0)
BASOS ABS: 0.2 10*3/uL — AB (ref 0.0–0.1)
EOS ABS: 0.1 10*3/uL (ref 0.0–0.5)
EOS%: 1.4 % (ref 0.0–7.0)
HCT: 35.3 % — ABNORMAL LOW (ref 38.4–49.9)
HEMOGLOBIN: 11.3 g/dL — AB (ref 13.0–17.1)
LYMPH%: 30 % (ref 14.0–49.0)
MCH: 27 pg — ABNORMAL LOW (ref 27.2–33.4)
MCHC: 32 g/dL (ref 32.0–36.0)
MCV: 84.4 fL (ref 79.3–98.0)
MONO#: 0.4 10*3/uL (ref 0.1–0.9)
MONO%: 5.6 % (ref 0.0–14.0)
NEUT%: 60.7 % (ref 39.0–75.0)
NEUTROS ABS: 4 10*3/uL (ref 1.5–6.5)
Platelets: 193 10*3/uL (ref 140–400)
RBC: 4.18 10*6/uL — ABNORMAL LOW (ref 4.20–5.82)
RDW: 14.9 % — ABNORMAL HIGH (ref 11.0–14.6)
WBC: 6.5 10*3/uL (ref 4.0–10.3)
lymph#: 2 10*3/uL (ref 0.9–3.3)

## 2014-07-09 NOTE — Telephone Encounter (Signed)
gv adn pritned appt sched and avs for pt for NOV....gv pt infor for Dr. [name]

## 2014-07-09 NOTE — Progress Notes (Signed)
  [place] OFFICE PROGRESS NOTE   Diagnosis: Gastric cancer  INTERVAL HISTORY:   He returns as scheduled. He continues to have malaise and anorexia. Stable exertional dyspnea. No nausea or dysphagia. No bleeding. He is no longer taking iron. His wife reports Danny Page stays in the home most of the time watching TV and has little interest in other activities. She feels she is depressed. He speaks of death often.  He reports nocturia and intermittent constipation.  Objective:  Vital signs in last 24 hours:  Blood pressure 100/57, pulse 84, temperature 98.2 F (36.8 C), temperature source Oral, resp. rate 18, height 5\' 6"  (1.676 m), weight 106 lb 4.8 oz (48.217 kg).    HEENT: Neck without mass Lymphatics: No cervical or supraclavicular nodes. "Shotty "bilateral axillary nodes Resp: Distant breath sounds, end inspiratory rhonchi at the left greater than right base, no respiratory distress Cardio: Regular rate and rhythm GI: No hepatomegaly, nontender, no mass Vascular: No leg edema    Lab Results:  Lab Results  Component Value Date   WBC 6.5 07/09/2014   HGB 11.3* 07/09/2014   HCT 35.3* 07/09/2014   MCV 84.4 07/09/2014   PLT 193 07/09/2014   NEUTROABS 4.0 07/09/2014    Medications: I have reviewed the patient's current medications.  Assessment/Plan: 1. Adenocarcinoma of the stomach. Adenocarcinoma found in the gastric remnant status post previous partial gastrectomy/Billroth II anastomosis. No evidence of metastatic disease by physical exam or CT of the abdomen/pelvis. Staging EUS on 03/30/2012 confirmed a uT3 uN0 tumor. He began radiation and concurrent Xeloda chemotherapy on 04/18/2012. Xeloda was subsequently placed on hold due to chest pain. Xeloda was resumed on 04/26/2012 with no further chest pain. The Xeloda dose was titrated to 1000 mg twice daily beginning 04/28/2012. He completed treatment on 05/30/2012. Restaging CT on 08/01/2012 with no evidence of  metastatic disease. -Upper endoscopy 05/08/2013 with abnormal mucosa at the lesser curvature and no tumor mass seen, biopsy negative for malignancy  2. Remote partial gastrectomy/Billroth II anastomosis secondary to ulcer disease. 3. Iron deficiency anemia secondary to the gastric mass status post red cell transfusion 03/17/2012.  4. COPD. 5. History of colon polyps. 6. History of Chest pain/subxiphoid pain-most likely related to radiation esophagitis/gastritis or the gastric mass 7. Anorexia/weight loss-? Secondary to gastric cancer or COPD, depression 8. Depression-he started a trial of Remeron on 06/13/2012, no longer taking Remeron 9. Dyspnea/low-grade fever at an office visit on 06/20/2012-? COPD flare,? Radiation pneumonitis-he completed a trial of prednisone. The symptoms partially improved. 10. History of Orthostasis 11. History of Dysphagia-none today 12. Indeterminate 8 mm left liver lesion on the CT 08/01/2012.   Disposition:  He continues to have anorexia and depression. He declined a referral to psychology. There is no evidence for progression of the gastric cancer aside from slight worsening of anemia. He will resume ferrous sulfate.  Danny Page will see Dr. [name] to evaluate the urinary symptoms and continued weight loss. I suspect the weight loss is related to depression and COPD.  He will return for an office visit and CBC in 2 months.  [name], MD  07/09/2014  4:17 PM

## 2014-07-15 ENCOUNTER — Encounter: Payer: Self-pay | Admitting: Internal Medicine

## 2014-07-15 ENCOUNTER — Ambulatory Visit (INDEPENDENT_AMBULATORY_CARE_PROVIDER_SITE_OTHER): Payer: Medicare Other | Admitting: Internal Medicine

## 2014-07-15 VITALS — BP 106/59 | HR 81 | Temp 98.0°F | Wt 106.0 lb

## 2014-07-15 DIAGNOSIS — N400 Enlarged prostate without lower urinary tract symptoms: Secondary | ICD-10-CM | POA: Insufficient documentation

## 2014-07-15 DIAGNOSIS — F321 Major depressive disorder, single episode, moderate: Secondary | ICD-10-CM

## 2014-07-15 DIAGNOSIS — J449 Chronic obstructive pulmonary disease, unspecified: Secondary | ICD-10-CM

## 2014-07-15 DIAGNOSIS — F339 Major depressive disorder, recurrent, unspecified: Secondary | ICD-10-CM | POA: Insufficient documentation

## 2014-07-15 LAB — POCT URINALYSIS DIPSTICK
BILIRUBIN UA: NEGATIVE
Blood, UA: NEGATIVE
GLUCOSE UA: NEGATIVE
Ketones, UA: NEGATIVE
LEUKOCYTES UA: NEGATIVE
NITRITE UA: NEGATIVE
Protein, UA: NEGATIVE
Spec Grav, UA: >=1.03
Urobilinogen, UA: NEGATIVE
pH, UA: 6

## 2014-07-15 MED ORDER — MIRTAZAPINE 15 MG PO TABS: 15.0000 mg | ORAL_TABLET | Freq: Every day | ORAL | Status: DC

## 2014-07-15 MED ORDER — TAMSULOSIN HCL 0.4 MG PO CAPS: 0.4000 mg | ORAL_CAPSULE | Freq: Every day | ORAL | Status: DC

## 2014-07-15 NOTE — Progress Notes (Signed)
Pre visit review using our clinic review tool, if applicable. No additional management support is needed unless otherwise documented below in the visit note. 

## 2014-07-15 NOTE — Assessment & Plan Note (Signed)
Chronic dyspnea This is a big part of his depression--he is limited in what he can do

## 2014-07-15 NOTE — Assessment & Plan Note (Signed)
May be sequelae of cancer and Rx--though seems to be in remission Not eating so will try mirtazapine Follow up in 3 weeks High risk--though not suicidal now Has firearms--unwilling to remove but they are locked up

## 2014-07-15 NOTE — Assessment & Plan Note (Signed)
Classic symptoms of partial obstruction Urinalysis negative and symptoms don't suggest prostatitis Will start Rx with tamsulosin

## 2014-07-15 NOTE — Progress Notes (Signed)
Subjective:    Patient ID: Danny Page, male    DOB: Jun 08, 1935, 79 y.o.   MRN: [id number]  HPI Here with wife Having trouble with urine stream--for at least 2 months  Will be slow and then stop---he has to pump it to get it to continue Takes a long time to empty bladder--but it does seem to empty No dysuria or hematuria Here after Dr Benay Spice told him   No fever No back pain other than usual  He has had some depressed mood Goes way back Despair, has thought about death Appetite is poor---losing weight Has clear anhedonia Goes back for a year or so thinks about dying --has mentioned suicidal thoughts to wife---but denies this now  Current Outpatient Prescriptions on File Prior to Visit  Medication Sig Dispense Refill  . albuterol (PROAIR HFA) 108 (90 BASE) MCG/ACT inhaler Inhale 2 puffs into the lungs every 6 (six) hours as needed for wheezing or shortness of breath.  18 g  0  . aspirin 81 MG tablet Take 81 mg by mouth daily.      Marland Kitchen LORazepam (ATIVAN) 0.5 MG tablet Take 1 tablet (0.5 mg total) by mouth every 8 (eight) hours as needed (for nausea).  60 tablet  1  . pantoprazole (PROTONIX) 40 MG tablet Take 1 tablet (40 mg total) by mouth daily.  90 tablet  3  . tiotropium (SPIRIVA HANDIHALER) 18 MCG inhalation capsule Place 1 capsule (18 mcg total) into inhaler and inhale daily.  30 capsule  5   No current facility-administered medications on file prior to visit.    No Known Allergies  Past Medical History  Diagnosis Date  . COPD (chronic obstructive pulmonary disease)   . Post herpetic neuralgia   . Hemorrhoids   . Anemia   . Stomach cancer     remission  . CAD (coronary artery disease) 9/14    mildly abnormal perfusion study    Past Surgical History  Procedure Laterality Date  . Stomach surgery      for ulcers, age 63  . Eus  03/30/2012    Procedure: UPPER ENDOSCOPIC ULTRASOUND (EUS) LINEAR;  Surgeon: Milus Banister, MD;  Location: [facility];  Service:  Endoscopy;  Laterality: N/A;    Family History  Problem Relation Age of Onset  . Heart disease Brother   . Cancer Mother 14    breast ca with mets to lymph nodes    History   Social History  . Marital Status: Married    Spouse Name: N/A    Number of Children: N/A  . Years of Education: N/A   Occupational History  . retired, Chief Financial Officer    Social History Main Topics  . Smoking status: Former Smoker -- 45 years    Types: Cigarettes    Quit date: 10/26/2003  . Smokeless tobacco: Never Used  . Alcohol Use: No     Comment: quit about 2004, heavy in the past  . Drug Use: No  . Sexual Activity: Not on file   Other Topics Concern  . Not on file   Social History Narrative   No living will   Requests wife as health care POA   Would accept resuscitation attempts   Would probably accept feeding tube   Review of Systems Not sexually active No abdominal pain Appetite is not great--since radiation and chemo    Objective:   Physical Exam  Constitutional: No distress.  Neck: Normal range of motion. Neck supple. No thyromegaly  present.  Cardiovascular: Normal rate, regular rhythm, normal heart sounds and intact distal pulses.  Exam reveals no gallop.   No murmur heard. Pulmonary/Chest: Effort normal and breath sounds normal. No respiratory distress. He has no wheezes. He has no rales.  Abdominal: Soft. He exhibits no distension. There is no tenderness. There is no rebound and no guarding.  Musculoskeletal: He exhibits no edema.  No spine tenderness  Lymphadenopathy:    He has no cervical adenopathy.  Psychiatric:  Somewhat flat No suicidal ideation          Assessment & Plan:

## 2014-07-24 ENCOUNTER — Other Ambulatory Visit: Payer: Self-pay | Admitting: Oncology

## 2014-08-16 ENCOUNTER — Encounter: Payer: Self-pay | Admitting: Internal Medicine

## 2014-08-16 ENCOUNTER — Ambulatory Visit (INDEPENDENT_AMBULATORY_CARE_PROVIDER_SITE_OTHER): Payer: Medicare Other | Admitting: Internal Medicine

## 2014-08-16 VITALS — BP 102/58 | HR 77 | Temp 98.4°F | Wt 104.0 lb

## 2014-08-16 DIAGNOSIS — F321 Major depressive disorder, single episode, moderate: Secondary | ICD-10-CM

## 2014-08-16 NOTE — Progress Notes (Signed)
Subjective:    Patient ID: Danny Page, male    DOB: [birth year], 79 y.o.   MRN: [id number]  HPI Here with wife  He says "I'm okay" No improvement in appetite  Weight down 2# more  Still irritable per wife Some days worse than others Sleeps well--but no change in that on the medication Still wishes he would die--but denies suicidal ideation  Faith not a part of his life He "talks to his creator every day" Wife goes on vacation--but he won't go with her Still stays in house watching TV Feels limited due to COPD  Current Outpatient Prescriptions on File Prior to Visit  Medication Sig Dispense Refill  . aspirin 81 MG tablet Take 81 mg by mouth daily.      Marland Kitchen LORazepam (ATIVAN) 0.5 MG tablet Take 1 tablet (0.5 mg total) by mouth every 8 (eight) hours as needed (for nausea).  60 tablet  1  . mirtazapine (REMERON) 15 MG tablet Take 1 tablet (15 mg total) by mouth at bedtime.  30 tablet  3  . pantoprazole (PROTONIX) 40 MG tablet Take 1 tablet (40 mg total) by mouth daily.  90 tablet  3  . PROAIR HFA 108 (90 BASE) MCG/ACT inhaler INHALE TWO PUFFS BY MOUTH EVERY 6 HOURS AS NEEDED FOR WHEEZING OR SHORTNESS OF BREATH  18 each  0  . tamsulosin (FLOMAX) 0.4 MG CAPS capsule Take 1 capsule (0.4 mg total) by mouth daily.  30 capsule  11  . tiotropium (SPIRIVA HANDIHALER) 18 MCG inhalation capsule Place 1 capsule (18 mcg total) into inhaler and inhale daily.  30 capsule  5   No current facility-administered medications on file prior to visit.    No Known Allergies  Past Medical History  Diagnosis Date  . COPD (chronic obstructive pulmonary disease)   . Post herpetic neuralgia   . Hemorrhoids   . Anemia   . Stomach cancer     remission  . CAD (coronary artery disease) 9/14    mildly abnormal perfusion study  . BPH (benign prostatic hypertrophy)     Past Surgical History  Procedure Laterality Date  . Stomach surgery      for ulcers, age 82  . Eus  03/30/2012    Procedure: UPPER  ENDOSCOPIC ULTRASOUND (EUS) LINEAR;  Surgeon: Milus Banister, MD;  Location: [facility];  Service: Endoscopy;  Laterality: N/A;    Family History  Problem Relation Age of Onset  . Heart disease Brother   . Cancer Mother 71    breast ca with mets to lymph nodes    History   Social History  . Marital Status: Married    Spouse Name: N/A    Number of Children: N/A  . Years of Education: N/A   Occupational History  . retired, Chief Financial Officer    Social History Main Topics  . Smoking status: Former Smoker -- 45 years    Types: Cigarettes    Quit date: 10/26/2003  . Smokeless tobacco: Never Used  . Alcohol Use: No     Comment: quit about 2004, heavy in the past  . Drug Use: No  . Sexual Activity: Not on file   Other Topics Concern  . Not on file   Social History Narrative   No living will   Requests wife as health care POA   Would accept resuscitation attempts   Would probably accept feeding tube   Review of Systems Voiding better on the tamsulosin Does have some orthostatic  lightheadedness ---like when getting up after putting on socks (he doesn't think it is any worse)    Objective:   Physical Exam  Psychiatric:  Normal appearance and speech Wife is sure he has shame about SOB and that is why he doesn't want to travel          Assessment & Plan:

## 2014-08-16 NOTE — Progress Notes (Signed)
Pre visit review using our clinic review tool, if applicable. No additional management support is needed unless otherwise documented below in the visit note. 

## 2014-08-16 NOTE — Assessment & Plan Note (Signed)
No response with Rx Counseled about prayer Suggested other med--he doesn't want Talked about getting out, etc--he is resistant No active suicidal thoughts though  Will hold off on Rx Wife will let me know if things worsen

## 2014-08-19 ENCOUNTER — Ambulatory Visit: Payer: Self-pay | Admitting: Ophthalmology

## 2014-08-29 ENCOUNTER — Ambulatory Visit: Payer: Self-pay | Admitting: Ophthalmology

## 2014-09-03 ENCOUNTER — Telehealth: Payer: Self-pay | Admitting: Oncology

## 2014-09-03 ENCOUNTER — Ambulatory Visit (HOSPITAL_BASED_OUTPATIENT_CLINIC_OR_DEPARTMENT_OTHER): Payer: Medicare Other | Admitting: Oncology

## 2014-09-03 ENCOUNTER — Other Ambulatory Visit (HOSPITAL_BASED_OUTPATIENT_CLINIC_OR_DEPARTMENT_OTHER): Payer: Medicare Other

## 2014-09-03 VITALS — BP 103/58 | HR 96 | Temp 98.0°F | Resp 18 | Ht 66.0 in | Wt 104.4 lb

## 2014-09-03 DIAGNOSIS — K7689 Other specified diseases of liver: Secondary | ICD-10-CM

## 2014-09-03 DIAGNOSIS — D649 Anemia, unspecified: Secondary | ICD-10-CM

## 2014-09-03 DIAGNOSIS — R630 Anorexia: Secondary | ICD-10-CM

## 2014-09-03 DIAGNOSIS — R0609 Other forms of dyspnea: Secondary | ICD-10-CM

## 2014-09-03 DIAGNOSIS — C169 Malignant neoplasm of stomach, unspecified: Secondary | ICD-10-CM

## 2014-09-03 DIAGNOSIS — R634 Abnormal weight loss: Secondary | ICD-10-CM

## 2014-09-03 DIAGNOSIS — Z85028 Personal history of other malignant neoplasm of stomach: Secondary | ICD-10-CM

## 2014-09-03 DIAGNOSIS — Z862 Personal history of diseases of the blood and blood-forming organs and certain disorders involving the immune mechanism: Secondary | ICD-10-CM

## 2014-09-03 LAB — CBC WITH DIFFERENTIAL/PLATELET
BASO%: 0.7 % (ref 0.0–2.0)
BASOS ABS: 0.1 10*3/uL (ref 0.0–0.1)
EOS%: 0.6 % (ref 0.0–7.0)
Eosinophils Absolute: 0.1 10*3/uL (ref 0.0–0.5)
HCT: 36.6 % — ABNORMAL LOW (ref 38.4–49.9)
HEMOGLOBIN: 11.9 g/dL — AB (ref 13.0–17.1)
LYMPH#: 1.7 10*3/uL (ref 0.9–3.3)
LYMPH%: 21.1 % (ref 14.0–49.0)
MCH: 27 pg — AB (ref 27.2–33.4)
MCHC: 32.5 g/dL (ref 32.0–36.0)
MCV: 83.2 fL (ref 79.3–98.0)
MONO#: 0.5 10*3/uL (ref 0.1–0.9)
MONO%: 5.8 % (ref 0.0–14.0)
NEUT#: 5.8 10*3/uL (ref 1.5–6.5)
NEUT%: 71.8 % (ref 39.0–75.0)
Platelets: 184 10*3/uL (ref 140–400)
RBC: 4.4 10*6/uL (ref 4.20–5.82)
RDW: 14.6 % (ref 11.0–14.6)
WBC: 8.1 10*3/uL (ref 4.0–10.3)
nRBC: 0 % (ref 0–0)

## 2014-09-03 NOTE — Telephone Encounter (Signed)
gv and printed appt sched and avs fo ropt for May 2016

## 2014-09-03 NOTE — Progress Notes (Signed)
  [place] [facility] [place] OFFICE PROGRESS NOTE   Diagnosis: gastric cancer  INTERVAL HISTORY:   He returns as scheduled. He is taking iron. No bleeding. He continues to have exertional dyspnea. He eats very little and does not get out of the house very much.he denies suicidal thoughts.  Objective:  Vital signs in last 24 hours:  Blood pressure 103/58, pulse 96, temperature 98 F (36.7 C), temperature source Oral, resp. rate 18, height 5\' 6"  (1.676 m), weight 104 lb 6.4 oz (47.356 kg).    HEENT: neck without mass Lymphatics: no cervical or supraclavicular nodes, "shotty "bilateral axillary nodes Resp: distant breath sounds, inspiratory rhonchi at the bases, no respiratory distress Cardio: regular rate and rhythm GI: no hepatomegaly, nontender, no mass Vascular: no leg edema   Lab Results:  Lab Results  Component Value Date   WBC 8.1 09/03/2014   HGB 11.9* 09/03/2014   HCT 36.6* 09/03/2014   MCV 83.2 09/03/2014   PLT 184 09/03/2014   NEUTROABS 5.8 09/03/2014    Medications: I have reviewed the patient's current medications.  Assessment/Plan: 1. Adenocarcinoma of the stomach. Adenocarcinoma found in the gastric remnant status post previous partial gastrectomy/Billroth II anastomosis. No evidence of metastatic disease by physical exam or CT of the abdomen/pelvis. Staging EUS on 03/30/2012 confirmed a uT3 uN0 tumor. He began radiation and concurrent Xeloda chemotherapy on 04/18/2012. Xeloda was subsequently placed on hold due to chest pain. Xeloda was resumed on 04/26/2012 with no further chest pain. The Xeloda dose was titrated to 1000 mg twice daily beginning 04/28/2012. He completed treatment on 05/30/2012. Restaging CT on 08/01/2012 with no evidence of metastatic disease. -Upper endoscopy 05/08/2013 with abnormal mucosa at the lesser curvature and no tumor mass seen, biopsy negative for malignancy  2. Remote partial gastrectomy/Billroth II anastomosis secondary to ulcer  disease. 3. Iron deficiency anemia secondary to the gastric mass status post red cell transfusion 03/17/2012.  4. COPD. 5. History of colon polyps. 6. History of Chest pain/subxiphoid pain-most likely related to radiation esophagitis/gastritis or the gastric mass 7. Anorexia/weight loss-? Secondary to gastric cancer or COPD, depression 8. Depression-he started a trial of Remeron on 06/13/2012, no longer taking Remeron 9. Dyspnea/low-grade fever at an office visit on 06/20/2012-? COPD flare,? Radiation pneumonitis-he completed a trial of prednisone. The symptoms partially improved. 10. History of Orthostasis 11. History of Dysphagia-none today 12. Indeterminate 8 mm left liver lesion on the CT 08/01/2012.   Disposition:  He appears unchanged. He continues to have signs of depression. No evidence for progression of the gastric cancer. He declines an antidepressant and an appetite stimulant.His affect appeared brighter today and he was more conversant Mr.Ardelle Lesches will return for an office visit in 6 months. Ms. Mcmahill will contact us in the interim if he develops any symptoms.  [name], MD  09/03/2014  12:58 PM

## 2014-10-01 ENCOUNTER — Other Ambulatory Visit: Payer: Self-pay | Admitting: Oncology

## 2014-10-01 ENCOUNTER — Other Ambulatory Visit: Payer: Self-pay | Admitting: Internal Medicine

## 2014-10-25 HISTORY — PX: CATARACT EXTRACTION W/ INTRAOCULAR LENS IMPLANT: SHX1309

## 2014-10-29 ENCOUNTER — Other Ambulatory Visit: Payer: Self-pay | Admitting: Oncology

## 2014-10-29 ENCOUNTER — Other Ambulatory Visit: Payer: Self-pay

## 2014-10-29 MED ORDER — TIOTROPIUM BROMIDE MONOHYDRATE 18 MCG IN CAPS: 18.0000 ug | ORAL_CAPSULE | Freq: Every day | RESPIRATORY_TRACT | Status: DC

## 2014-10-29 NOTE — Telephone Encounter (Signed)
pt left v/m requesting refill spiriva to [facility] [address]. Mrs Landess notified refill done.

## 2014-11-28 ENCOUNTER — Other Ambulatory Visit: Payer: Self-pay | Admitting: Oncology

## 2014-11-29 ENCOUNTER — Other Ambulatory Visit: Payer: Self-pay | Admitting: Oncology

## 2014-12-19 ENCOUNTER — Other Ambulatory Visit: Payer: Self-pay | Admitting: Internal Medicine

## 2015-01-08 ENCOUNTER — Encounter: Payer: Self-pay | Admitting: Internal Medicine

## 2015-01-08 ENCOUNTER — Ambulatory Visit (INDEPENDENT_AMBULATORY_CARE_PROVIDER_SITE_OTHER): Payer: Medicare Other | Admitting: Internal Medicine

## 2015-01-08 VITALS — BP 100/60 | HR 78 | Temp 97.4°F | Ht 66.0 in | Wt 106.0 lb

## 2015-01-08 DIAGNOSIS — Z0000 Encounter for general adult medical examination without abnormal findings: Secondary | ICD-10-CM | POA: Diagnosis not present

## 2015-01-08 DIAGNOSIS — J439 Emphysema, unspecified: Secondary | ICD-10-CM

## 2015-01-08 DIAGNOSIS — E440 Moderate protein-calorie malnutrition: Secondary | ICD-10-CM | POA: Diagnosis not present

## 2015-01-08 DIAGNOSIS — F339 Major depressive disorder, recurrent, unspecified: Secondary | ICD-10-CM | POA: Diagnosis not present

## 2015-01-08 DIAGNOSIS — C169 Malignant neoplasm of stomach, unspecified: Secondary | ICD-10-CM

## 2015-01-08 DIAGNOSIS — Z7189 Other specified counseling: Secondary | ICD-10-CM

## 2015-01-08 LAB — COMPREHENSIVE METABOLIC PANEL
ALBUMIN: 4.3 g/dL (ref 3.5–5.2)
ALT: 11 U/L (ref 0–53)
AST: 21 U/L (ref 0–37)
Alkaline Phosphatase: 109 U/L (ref 39–117)
BUN: 13 mg/dL (ref 6–23)
CALCIUM: 9.2 mg/dL (ref 8.4–10.5)
CHLORIDE: 98 meq/L (ref 96–112)
CO2: 29 mEq/L (ref 19–32)
Creatinine, Ser: 0.99 mg/dL (ref 0.40–1.50)
GFR: 77.82 mL/min (ref >60.00)
Glucose, Bld: 88 mg/dL (ref 70–99)
POTASSIUM: 4.4 meq/L (ref 3.5–5.1)
Sodium: 133 mEq/L — ABNORMAL LOW (ref 135–145)
Total Bilirubin: 0.4 mg/dL (ref 0.2–1.2)
Total Protein: 7.1 g/dL (ref 6.0–8.3)

## 2015-01-08 LAB — CBC WITH DIFFERENTIAL/PLATELET
BASOS ABS: 0.1 10*3/uL (ref 0.0–0.1)
Basophils Relative: 0.9 % (ref 0.0–3.0)
EOS ABS: 0.1 10*3/uL (ref 0.0–0.7)
Eosinophils Relative: 0.6 % (ref 0.0–5.0)
HCT: 37.4 % — ABNORMAL LOW (ref 39.0–52.0)
Hemoglobin: 12.2 g/dL — ABNORMAL LOW (ref 13.0–17.0)
LYMPHS PCT: 24.3 % (ref 12.0–46.0)
Lymphs Abs: 1.9 10*3/uL (ref 0.7–4.0)
MCHC: 32.5 g/dL (ref 30.0–36.0)
MCV: 77.9 fl — AB (ref 78.0–100.0)
MONO ABS: 0.4 10*3/uL (ref 0.1–1.0)
Monocytes Relative: 5.5 % (ref 3.0–12.0)
NEUTROS ABS: 5.4 10*3/uL (ref 1.4–7.7)
Neutrophils Relative %: 68.7 % (ref 43.0–77.0)
Platelets: 294 10*3/uL (ref 150.0–400.0)
RBC: 4.8 Mil/uL (ref 4.22–5.81)
RDW: 15.8 % — AB (ref 11.5–15.5)
WBC: 7.9 10*3/uL (ref 4.0–10.5)

## 2015-01-08 LAB — T4, FREE: Free T4: 1 ng/dL (ref 0.60–1.60)

## 2015-01-08 NOTE — Assessment & Plan Note (Signed)
Seems to be in remission Sees Dr Benay Spice

## 2015-01-08 NOTE — Assessment & Plan Note (Signed)
Refuses food from wife Won't take supplements

## 2015-01-08 NOTE — Assessment & Plan Note (Signed)
Blank forms given DNR done 

## 2015-01-08 NOTE — Assessment & Plan Note (Signed)
Chronic dyspnea Stays on spiriva

## 2015-01-08 NOTE — Progress Notes (Signed)
Subjective:    Patient ID: Danny Page, male    DOB: 15-Jul-1935, 80 y.o.   MRN: [id number]  HPI Here for Medicare wellness and follow up of multiple medical problems Wife is here Reviewed form and advanced directives No alcohol or tobacco now Just sees Dr Benay Spice Will still drive and go to store--needs to walk with cart to get around Independent with ADLs but doesn't help wife with instrumental ADLs No falls Vision okay--right cataract removed 3 months ago. Hearing is off but he deals with it No exercise Mild memory problems--wife also notes him forgetting things easy (may be related to hearing)  Mood issues are ongoing Still prefers to stay at home all the time "grouchy" per wife---not really any different Still would like to die but no suicidal ideation Still not willing to take meds for this  Gets very easy DOE Had 1 very bad spell in house with minimal exertion and was breathless No recurrence of this bad spell No fever No regular cough  Continues to follow with Dr Benay Spice Gastric cancer seems quiet Still on 6 month follow up with him Not eating well--some better for last 2 weeks since he knew he had appt here Weight is up 2#  No recent problems with the PHN No meds for this  Voids okay No nocturia No daytime urgency or frequency  Current Outpatient Prescriptions on File Prior to Visit  Medication Sig Dispense Refill  . aspirin 81 MG tablet Take 81 mg by mouth daily.    Marland Kitchen PROAIR HFA 108 (90 BASE) MCG/ACT inhaler INHALE TWO PUFFS BY MOUTH EVERY 6 HOURS AS NEEDED FOR SHORTNESS OF BREATH/WHEEZE 18 each 0  . SPIRIVA HANDIHALER 18 MCG inhalation capsule INHALE ONE DOSE BY MOUTH ONCE DAILY 30 capsule 4   No current facility-administered medications on file prior to visit.    No Known Allergies  Past Medical History  Diagnosis Date  . COPD (chronic obstructive pulmonary disease)   . Post herpetic neuralgia   . Hemorrhoids   . Anemia   . Stomach cancer       remission  . CAD (coronary artery disease) 9/14    mildly abnormal perfusion study  . BPH (benign prostatic hypertrophy)     Past Surgical History  Procedure Laterality Date  . Stomach surgery      for ulcers, age 38  . Eus  03/30/2012    Procedure: UPPER ENDOSCOPIC ULTRASOUND (EUS) LINEAR;  Surgeon: Milus Banister, MD;  Location: [facility];  Service: Endoscopy;  Laterality: N/A;  . Cataract extraction w/ intraocular lens implant Right 2016    Family History  Problem Relation Age of Onset  . Heart disease Brother   . Cancer Mother 27    breast ca with mets to lymph nodes    History   Social History  . Marital Status: Married    Spouse Name: N/A  . Number of Children: N/A  . Years of Education: N/A   Occupational History  . retired, Chief Financial Officer    Social History Main Topics  . Smoking status: Former Smoker -- 45 years    Types: Cigarettes    Quit date: 10/26/2003  . Smokeless tobacco: Never Used  . Alcohol Use: No     Comment: quit about 2004, heavy in the past  . Drug Use: No  . Sexual Activity: Not on file   Other Topics Concern  . Not on file   Social History Narrative   No living will  Requests wife as health care POA--no set form   Requests DNR--- done 01/08/15   Would not want a feeding tube   Review of Systems Sleeps okay No joint pains--some pain in legs at times and cramps in hands Bowels are fine Prefers no immunizations Wears seat belt    Objective:   Physical Exam  Constitutional: No distress.  Marked wasting of muscles  HENT:  Mouth/Throat: Oropharynx is clear and moist. No oropharyngeal exudate.  Neck: Normal range of motion. Neck supple. No thyromegaly present.  Cardiovascular: Normal rate, regular rhythm and normal heart sounds.  Exam reveals no gallop.   No murmur heard. Pedal pulses not palpable  Pulmonary/Chest: Effort normal and breath sounds normal. No respiratory distress. He has no wheezes. He has no rales.  Abdominal:  Soft. There is no tenderness.  Musculoskeletal: He exhibits no edema or tenderness.  Lymphadenopathy:    He has no cervical adenopathy.  Neurological: He is alert.  February 2016. [place] [facility]-- "Obama, Clinton---then Bush" [id number] Recall 0/3  Skin: No rash noted. No erythema.  Psychiatric:  Depressed Some psychomotor retardation Appropriate interaction though          Assessment & Plan:

## 2015-01-08 NOTE — Assessment & Plan Note (Signed)
Will not accept treatment for this or referral Counseled though

## 2015-01-08 NOTE — Assessment & Plan Note (Signed)
I have personally reviewed the Medicare Annual Wellness questionnaire and have noted 1. The patient's medical and social history 2. Their use of alcohol, tobacco or illicit drugs 3. Their current medications and supplements 4. The patient's functional ability including ADL's, fall risks, home safety risks and hearing or visual             impairment. 5. Diet and physical activities 6. Evidence for depression or mood disorders  The patients weight, height, BMI and visual acuity have been recorded in the chart I have made referrals, counseling and provided education to the patient based review of the above and I have provided the pt with a written personalized care plan for preventive services.  I have provided you with a copy of your personalized plan for preventive services. Please take the time to review along with your updated medication list.  He wants no immunizations or cancer screening Doesn't want to follow my recommendations in general Mild cognitive issues may be from depression

## 2015-01-09 ENCOUNTER — Encounter: Payer: Self-pay | Admitting: *Deleted

## 2015-02-15 NOTE — Op Note (Signed)
PATIENT NAME:  Danny Page, Danny Page MR#:  [id number] DATE OF BIRTH:  01-07-1935  DATE OF PROCEDURE:  08/29/2014  PREOPERATIVE DIAGNOSIS:  Nuclear sclerotic cataract of the right eye.   POSTOPERATIVE DIAGNOSIS:  Nuclear sclerotic cataract of the right eye.   OPERATIVE PROCEDURE:  Cataract extraction by phacoemulsification with implant of intraocular lens to right eye.   SURGEON:  Birder Robson, MD.   ANESTHESIA:  1. Managed anesthesia care.  2. Topical tetracaine drops followed by 2% Xylocaine jelly applied in the preoperative holding area.   COMPLICATIONS:  None.   TECHNIQUE:  Stop and chop.    Please note that 0.2 mL of Shugarcaine anesthetic solution was placed in the anterior chamber at the onset of the case.   DESCRIPTION OF PROCEDURE:  The patient was examined and consented in the preoperative holding area where the aforementioned topical anesthesia was applied to the right eye and then brought back to the Operating Room where the right eye was prepped and draped in the usual sterile ophthalmic fashion and a lid speculum was placed. A paracentesis was created with the side port blade and the anterior chamber was filled with viscoelastic. A near clear corneal incision was performed with the steel keratome. A continuous curvilinear capsulorrhexis was performed with a cystotome followed by the capsulorrhexis forceps. Hydrodissection and hydrodelineation were carried out with BSS on a blunt cannula. The lens was removed in a stop and chop technique and the remaining cortical material was removed with the irrigation-aspiration handpiece. The capsular bag was inflated with viscoelastic and the Tecnis ZCB00 21.0-diopter lens, serial number [id number] was placed in the capsular bag without complication. The remaining viscoelastic was removed from the eye with the irrigation-aspiration handpiece. The wounds were hydrated. The anterior chamber was flushed with Miostat and the eye was inflated to  physiologic pressure. 0.1 mL of cefuroxime concentration 10 mg/mL was placed in the anterior chamber. The wounds were found to be water tight. The eye was dressed with Vigamox. The patient was given protective glasses to wear throughout the day and a shield with which to sleep tonight. The patient was also given drops with which to begin a drop regimen today and will follow-up with me in one day.    ____________________________ Livingston Diones. Devynn Hessler, MD wlp:bu D: 08/29/2014 16:50:43 ET T: 08/29/2014 17:24:20 ET JOB#: [id number]  cc: Coleston Dirosa L. Tannar Broker, MD, <Dictator> Livingston Diones Mairen Wallenstein MD ELECTRONICALLY SIGNED 08/30/2014 10:56

## 2015-02-16 NOTE — Discharge Summary (Signed)
PATIENT NAME:  Danny Page, CHROSTOWSKI MR#:  [id number] DATE OF BIRTH:  September 02, 1935  DATE OF ADMISSION:  04/21/2012 DATE OF DISCHARGE:  04/21/2012  DISCHARGE DIAGNOSIS: Chest pain, likely noncardiac, could be due to gastritis with negative serial cardiac enzymes and negative Myoview.   SECONDARY DIAGNOSES:  1. Chronic obstructive pulmonary disease. 2. Throat cancer, undergoing chemotherapy.   CONSULTATIONS: None.   LABORATORY, DIAGNOSTIC AND RADIOLOGICAL DATA: Myoview on the 28th of June was negative with normal LV function. CT scan of the chest with contrast on the 27th of June showed no PE. Emphysematous lung disease in the left lower lobe. Chest x-ray on the 27th of June showed chronic obstructive pulmonary disease without evidence of acute cardiopulmonary disease.   HISTORY AND SHORT HOSPITAL COURSE: The patient is a 80 year old male with the above-mentioned medical problems who was admitted for chest pain. He was ruled out with one negative set of cardiac enzymes. He underwent Myoview which was negative. He remained chest pain-free and was discharged home. He had minimal chest pain which was thought to be more of silent gastric reflux or gastritis. He was discharged home in stable condition.   PHYSICAL EXAMINATION: On the date of discharge, his vital signs were as follows: Temperature 97.6, heart rate 94 per minute, respirations 18 per minute, blood pressure 135/76. He was saturating 91% on room air. CARDIOVASCULAR: S1, S2 normal. No murmurs, rubs, or gallops. LUNGS: Clear to auscultation bilaterally. No wheezing, rales, rhonchi or crepitation. ABDOMEN: Soft, benign. NEUROLOGIC: Nonfocal examination. All other physical examination remained at the baseline.   DISCHARGE MEDICATIONS:  1. Prochlorperazine 10 mg p.o. every six hours as needed. 2. Spiriva once daily.  3. Protonix 40 mg p.o. daily.   DISCHARGE DIET: Low sodium.   DISCHARGE ACTIVITY: As tolerated.   DISCHARGE INSTRUCTIONS AND  FOLLOW-UP: The patient was instructed to follow-up with his primary care physician, Dr. Rosanna Randy, in 1 to 2 weeks, with Dr. Saralyn Pilar from cardiology in 2 to 4 weeks, and gastroenterology, Dr. Vira Agar in 4 to 6 weeks.   TOTAL TIME DISCHARGING THIS PATIENT: 45 minutes.    ____________________________ Lucina Mellow. Manuella Ghazi, MD vss:ap D: 04/22/2012 22:37:44 ET T: 04/23/2012 14:52:39 ET JOB#: [id number]  cc: Melesio Madara S. Manuella Ghazi, MD, <Dictator> Richard L. Rosanna Randy, MD Manya Silvas, MD Isaias Cowman, MD Lucina Mellow [facility] MD ELECTRONICALLY SIGNED 04/23/2012 19:25

## 2015-02-16 NOTE — H&P (Signed)
PATIENT NAME:  Danny Page, Danny Page MR#:  [id number] DATE OF BIRTH:  06-01-1935  DATE OF ADMISSION:  04/21/2012  PRIMARY CARE PHYSICIAN: Dr. [name]    CHIEF COMPLAINT: Chest pain.   HISTORY OF PRESENT ILLNESS: Danny Page is a 80 year old Caucasian male with history of chronic obstructive pulmonary disease and stomach cancer. The patient is seeing his oncologist in [place] and he is getting chemotherapy every Tuesday, Wednesday, Thursday. He got his chemotherapy today around 11 a.m. He went home and after having lunch while he was watching TV around 1 p.m. he experienced chest pain of the lower part of the midchest area described as pushing hard feeling. The severity was 2 to 3 on a scale of 10. It lasted about 15 to 20 minutes and then it subsided. The pain recurred again at 4 p.m. This time it was more severe. The severity was 9 to 10 on a scale of 10. It lasted about 25 minutes then started to ease off. By that time he came to the Emergency Department and it took another one hour for the pain to gradually ease off. He reported one episode of vomiting without hematemesis. His last bowel movement was normal in color. The patient has no prior history of coronary disease and no prior history of such pain. His initial work-up in the Emergency Department was unremarkable with normal troponin. The patient was admitted for 24-hour observation to follow-up on his cardiac enzymes.   REVIEW OF SYSTEMS: CONSTITUTIONAL: Denies having any fever. No chills. No night sweats. No fatigue. EYES: No blurring of vision. No double vision. ENT: No hearing impairment. No sore throat. No dysphagia. CARDIOVASCULAR: Reports the chest pain as stated above. No shortness of breath. No edema. No syncope. RESPIRATORY: No cough. No sputum production. No hemoptysis. GASTROINTESTINAL: No abdominal pain other than the lower midchest pain. One episode of vomiting. No hematemesis. No coffee-ground emesis. No melena. GENITOURINARY: No dysuria. No  frequency of urination. MUSCULOSKELETAL: No joint pain or swelling. No muscular pain or swelling. INTEGUMENTARY: No skin rash. No ulcers. NEUROLOGY: No focal weakness. No seizure activity. No headache. PSYCHIATRY: No anxiety. No depression. ENDOCRINE: No polyuria or polydipsia. No heat or cold intolerance.   PAST MEDICAL HISTORY:  1. Chronic obstructive pulmonary disease. 2. Stomach cancer, undergoing chemotherapy.   FAMILY HISTORY: He has no adequate information. He is originally from [place]. He left when he was at the age of 59. All he knows is that both parents died at the age of 46 of natural causes.   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 He is married and living with his wife.   SOCIAL HABITS: Ex-chronic smoker. He used to smoke 1 pack a day since age of 62 and he quit about eight years ago. He is a social drinker.   PAST SURGICAL HISTORY: At age of 17 he had stomach resection secondary to development of duodenal ulcer.   ADMISSION MEDICATIONS:  1. Spiriva 1 inhalation once a day.  2. Prochlorperazine for nausea.  3. He is also on chemotherapy pill that he does not recall the name.   ALLERGIES: No known drug allergies.   PHYSICAL EXAMINATION:   VITAL SIGNS: Blood pressure 115/65, respiratory rate 20, pulse 90, temperature 96.3, oxygen saturation 92% on room air.   GENERAL APPEARANCE: Elderly male laying in bed in no acute distress, healthy looking.   HEAD AND NECK: Mild pallor. No icterus. No cyanosis.   EARS, NOSE, AND THROAT: Hearing was normal. Nasal mucosa, lips,  tongue were normal.    EYES: Normal eyelids and conjunctivae. Pupils about 6 mm, equal and reactive to light.   NECK: Supple. Trachea at midline. No thyromegaly. No cervical lymphadenopathy. No masses.   HEART: Normal S1, S2. No S3 or S4. No murmur. No gallop. There is carotid bruit on the right side. Negative on the left.   RESPIRATORY: Normal breathing pattern without use of accessory muscles.  No rales. No wheezing.   ABDOMEN: Soft without tenderness. No hepatosplenomegaly. No masses. No hernias.   SKIN: No ulcers. No subcutaneous nodules.   MUSCULOSKELETAL: No joint swelling. No clubbing.   NEUROLOGIC: Cranial nerves II through XII are intact. No focal motor deficit.   PSYCHIATRY: The patient is alert and oriented x3. Mood and affect were normal.   LABORATORY, DIAGNOSTIC, AND RADIOLOGICAL DATA: EKG showed normal sinus rhythm at rate of 93 per minute. Incomplete right bundle branch block. T wave inversion in leads V1 to V3, otherwise unremarkable EKG.  CAT scan of the chest was negative for pulmonary embolism. There is evidence of emphysematous chest findings and some minimal nodularity laterally in the left lower lobe. No aortic aneurysm.   Serum glucose 115, BUN 13, creatinine 1.3, sodium 140, potassium 4.2, calcium 9.1. Liver function tests were normal. CPK 49. Troponin less than 0.02. CBC showed white count 6.9, hemoglobin 10, hematocrit 32, platelet count 234, MCV 76, MCH 23, MCHC 31. Prothrombin time 13. INR 1. D-dimer slightly elevated at 0.54.   ASSESSMENT:  1. Chest pain. Etiology is unclear whether this is cardiac or gastric in origin.  2. Stomach cancer, undergoing chemotherapy.  3. Chronic obstructive pulmonary disease.  4. Microcytic hypochromic anemia.  PLAN:  1. Admit the patient for observation. 2. Follow-up on cardiac enzymes.  3. I will start the patient on Protonix as it is quite possible that the pain could be GI in origin. Nevertheless, I will schedule the patient to have nuclear stress test to ensure no underlying coronary artery disease.  4. I informed the patient and his wife that he has right-sided carotid bruits, to address that with his primary care physician to consider outpatient carotid ultrasound.   5. Will consider nitroglycerin sublingual p.r.n. should he have recurrence of chest pain and also morphine p.r.n.  6. I asked the patient about a  LIVING WILL. He confirms prior that he does have a LIVING WILL and he also had appointed his wife to have the legal power-of-attorney.   TIME SPENT EVALUATING THIS PATIENT: More than 55 minutes.   ____________________________ Clovis Pu. Lenore Manner, MD amd:drc D: 04/21/2012 01:34:48 ET T: 04/21/2012 07:24:10 ET JOB#: [id number]  cc: Clovis Pu. Lenore Manner, MD, <Dictator> Venia Carbon, MD Mike Craze Irven Coe MD ELECTRONICALLY SIGNED 04/21/2012 22:57

## 2015-02-17 ENCOUNTER — Encounter: Payer: Self-pay | Admitting: Internal Medicine

## 2015-02-17 ENCOUNTER — Ambulatory Visit: Payer: Medicare Other | Admitting: Internal Medicine

## 2015-02-17 ENCOUNTER — Ambulatory Visit (INDEPENDENT_AMBULATORY_CARE_PROVIDER_SITE_OTHER): Payer: Medicare Other | Admitting: Internal Medicine

## 2015-02-17 VITALS — BP 120/60 | HR 75 | Temp 97.9°F | Wt 105.0 lb

## 2015-02-17 DIAGNOSIS — F339 Major depressive disorder, recurrent, unspecified: Secondary | ICD-10-CM

## 2015-02-17 NOTE — Progress Notes (Signed)
Pre visit review using our clinic review tool, if applicable. No additional management support is needed unless otherwise documented below in the visit note. 

## 2015-02-17 NOTE — Assessment & Plan Note (Signed)
Doing about the same appt made by mistake

## 2015-02-17 NOTE — Progress Notes (Signed)
   Subjective:    Patient ID: Danny Page, male    DOB: [birth year], 80 y.o.   MRN: [id number]  HPI Was called by mistake that they needed appt today Follow up not due till next year   Review of Systems     Objective:   Physical Exam        Assessment & Plan:

## 2015-03-04 ENCOUNTER — Other Ambulatory Visit (HOSPITAL_BASED_OUTPATIENT_CLINIC_OR_DEPARTMENT_OTHER): Payer: Medicare Other

## 2015-03-04 ENCOUNTER — Telehealth: Payer: Self-pay | Admitting: Oncology

## 2015-03-04 ENCOUNTER — Other Ambulatory Visit: Payer: Self-pay | Admitting: *Deleted

## 2015-03-04 ENCOUNTER — Ambulatory Visit (HOSPITAL_BASED_OUTPATIENT_CLINIC_OR_DEPARTMENT_OTHER): Payer: Medicare Other | Admitting: Oncology

## 2015-03-04 VITALS — BP 105/58 | HR 81 | Temp 98.2°F | Resp 16 | Ht 66.0 in | Wt 105.5 lb

## 2015-03-04 DIAGNOSIS — Z85028 Personal history of other malignant neoplasm of stomach: Secondary | ICD-10-CM

## 2015-03-04 DIAGNOSIS — C169 Malignant neoplasm of stomach, unspecified: Secondary | ICD-10-CM

## 2015-03-04 DIAGNOSIS — R718 Other abnormality of red blood cells: Secondary | ICD-10-CM | POA: Diagnosis not present

## 2015-03-04 DIAGNOSIS — D649 Anemia, unspecified: Secondary | ICD-10-CM

## 2015-03-04 LAB — CBC WITH DIFFERENTIAL/PLATELET
BASO%: 2.2 % — AB (ref 0.0–2.0)
Basophils Absolute: 0.2 10*3/uL — ABNORMAL HIGH (ref 0.0–0.1)
EOS%: 1.1 % (ref 0.0–7.0)
Eosinophils Absolute: 0.1 10*3/uL (ref 0.0–0.5)
HCT: 32.9 % — ABNORMAL LOW (ref 38.4–49.9)
HGB: 10.5 g/dL — ABNORMAL LOW (ref 13.0–17.1)
LYMPH%: 24.5 % (ref 14.0–49.0)
MCH: 24.6 pg — AB (ref 27.2–33.4)
MCHC: 31.8 g/dL — AB (ref 32.0–36.0)
MCV: 77.2 fL — ABNORMAL LOW (ref 79.3–98.0)
MONO#: 0.5 10*3/uL (ref 0.1–0.9)
MONO%: 6.8 % (ref 0.0–14.0)
NEUT#: 5.2 10*3/uL (ref 1.5–6.5)
NEUT%: 65.4 % (ref 39.0–75.0)
PLATELETS: 286 10*3/uL (ref 140–400)
RBC: 4.26 10*6/uL (ref 4.20–5.82)
RDW: 16.6 % — ABNORMAL HIGH (ref 11.0–14.6)
WBC: 7.9 10*3/uL (ref 4.0–10.3)
lymph#: 1.9 10*3/uL (ref 0.9–3.3)

## 2015-03-04 MED ORDER — LORAZEPAM 0.5 MG PO TABS: 0.5000 mg | ORAL_TABLET | Freq: Three times a day (TID) | ORAL | Status: DC | PRN

## 2015-03-04 MED ORDER — TIOTROPIUM BROMIDE MONOHYDRATE 18 MCG IN CAPS: ORAL_CAPSULE | RESPIRATORY_TRACT | Status: DC

## 2015-03-04 MED ORDER — FERROUS SULFATE 325 (65 FE) MG PO TBEC: 325.0000 mg | DELAYED_RELEASE_TABLET | Freq: Two times a day (BID) | ORAL | Status: DC

## 2015-03-04 NOTE — Progress Notes (Signed)
  [place] OFFICE PROGRESS NOTE   Diagnosis: Gastric cancer  INTERVAL HISTORY:   He returns as scheduled. He reports stable exertional dyspnea. He has increased dyspnea in the mornings. He reports several episodes of vomiting, but this is not a consistent complaint. No pain. No bleeding. He takes iron once daily most of the time.  Objective:  Vital signs in last 24 hours:  Blood pressure 89/42, pulse 81, temperature 98.2 F (36.8 C), temperature source Oral, resp. rate 16, height 5\' 6"  (1.676 m), weight 105 lb 8 oz (47.854 kg), SpO2 95 %.    HEENT: Neck without mass Lymphatics: No cervical or supraclavicular nodes. "Shotty "bilateral axillary nodes. Resp: Distant breath sounds, no respiratory distress Cardio: Regular rate and rhythm GI: No hepatosplenomegaly, nontender, no mass Vascular: No leg edema   Lab Results:  Lab Results  Component Value Date   WBC 7.9 03/04/2015   HGB 10.5* 03/04/2015   HCT 32.9* 03/04/2015   MCV 77.2* 03/04/2015   PLT 286 03/04/2015   NEUTROABS 5.2 03/04/2015     Medications: I have reviewed the patient's current medications.  Assessment/Plan: 1. Adenocarcinoma of the stomach. Adenocarcinoma found in the gastric remnant status post previous partial gastrectomy/Billroth II anastomosis. No evidence of metastatic disease by physical exam or CT of the abdomen/pelvis. Staging EUS on 03/30/2012 confirmed a uT3 uN0 tumor. He began radiation and concurrent Xeloda chemotherapy on 04/18/2012. Xeloda was subsequently placed on hold due to chest pain. Xeloda was resumed on 04/26/2012 with no further chest pain. The Xeloda dose was titrated to 1000 mg twice daily beginning 04/28/2012. He completed treatment on 05/30/2012. Restaging CT on 08/01/2012 with no evidence of metastatic disease. -Upper endoscopy 05/08/2013 with abnormal mucosa at the lesser curvature and no tumor mass seen, biopsy negative for malignancy  2. Remote partial  gastrectomy/Billroth II anastomosis secondary to ulcer disease. 3. Iron deficiency anemia secondary to the gastric mass status post red cell transfusion 03/17/2012.  4. COPD. 5. History of colon polyps. 6. History of Chest pain/subxiphoid pain-most likely related to radiation esophagitis/gastritis or the gastric mass 7. Anorexia/weight loss-? Secondary to gastric cancer or COPD, depression 8. Depression-he started a trial of Remeron on 06/13/2012, no longer taking Remeron 9. Dyspnea/low-grade fever at an office visit on 06/20/2012-? COPD flare,? Radiation pneumonitis-he completed a trial of prednisone. The symptoms partially improved. 10. History of Orthostasis 11. History of Dysphagia-none today 12. Indeterminate 8 mm left liver lesion on the CT 08/01/2012.    Disposition:  Mr. Romas appears stable. No clinical evidence for progression of gastric cancer. The hemoglobin and MCV are lower suggesting iron deficiency. I recommended he increase the ferrous sulfate to twice daily. He declined a three-month follow-up CBC. He will return for an office visit and CBC in 6 months. His wife will contact us in the interim if he develops bleeding or consistent nausea/vomiting. He declines influenza and pneumococcal vaccines.  [name], MD  03/04/2015  3:23 PM

## 2015-03-04 NOTE — Telephone Encounter (Signed)
Gave and printed appt sched and avs fo rpt; for NOV  °

## 2015-04-09 DIAGNOSIS — Z961 Presence of intraocular lens: Secondary | ICD-10-CM | POA: Diagnosis not present

## 2015-09-01 ENCOUNTER — Ambulatory Visit (INDEPENDENT_AMBULATORY_CARE_PROVIDER_SITE_OTHER): Payer: Medicare Other | Admitting: Internal Medicine

## 2015-09-01 DIAGNOSIS — J439 Emphysema, unspecified: Secondary | ICD-10-CM

## 2015-09-01 NOTE — Assessment & Plan Note (Signed)
Worsening Sounds like he is hospice appropriate Will make a home visit on Thursday and decide

## 2015-09-01 NOTE — Progress Notes (Signed)
   Subjective:    Patient ID: Danny Page, male    DOB: 23-Nov-1934, 80 y.o.   MRN: [id number]  HPI Wife wanted appointment to discuss things with me  His condition is worse No longer can even sit up for eating---stays in bed Breathless with minimal activity Chest is barrel shaped Only showering once or twice a week-- gets exhausted and wife needs to help  Gets sense of suffocating and drowning Can't walk even short distances without dyspnea Didn't want to come Wife mentioned me getting RN out (hospice) and he was resistant to this  Review of Systems     Objective:   Physical Exam        Assessment & Plan:

## 2015-09-02 ENCOUNTER — Other Ambulatory Visit (HOSPITAL_BASED_OUTPATIENT_CLINIC_OR_DEPARTMENT_OTHER): Payer: Medicare Other

## 2015-09-02 ENCOUNTER — Ambulatory Visit (HOSPITAL_BASED_OUTPATIENT_CLINIC_OR_DEPARTMENT_OTHER): Payer: Medicare Other | Admitting: Oncology

## 2015-09-02 ENCOUNTER — Other Ambulatory Visit: Payer: Self-pay | Admitting: *Deleted

## 2015-09-02 ENCOUNTER — Telehealth: Payer: Self-pay | Admitting: Oncology

## 2015-09-02 VITALS — BP 94/69 | HR 81 | Temp 98.1°F | Resp 16 | Ht 66.0 in | Wt 110.7 lb

## 2015-09-02 DIAGNOSIS — C169 Malignant neoplasm of stomach, unspecified: Secondary | ICD-10-CM

## 2015-09-02 DIAGNOSIS — D509 Iron deficiency anemia, unspecified: Secondary | ICD-10-CM

## 2015-09-02 DIAGNOSIS — K769 Liver disease, unspecified: Secondary | ICD-10-CM

## 2015-09-02 DIAGNOSIS — R630 Anorexia: Secondary | ICD-10-CM | POA: Diagnosis not present

## 2015-09-02 DIAGNOSIS — R634 Abnormal weight loss: Secondary | ICD-10-CM

## 2015-09-02 LAB — CBC WITH DIFFERENTIAL/PLATELET
BASO%: 2 % (ref 0.0–2.0)
Basophils Absolute: 0.2 10*3/uL — ABNORMAL HIGH (ref 0.0–0.1)
EOS%: 1.5 % (ref 0.0–7.0)
Eosinophils Absolute: 0.1 10*3/uL (ref 0.0–0.5)
HCT: 30.8 % — ABNORMAL LOW (ref 38.4–49.9)
HGB: 9.7 g/dL — ABNORMAL LOW (ref 13.0–17.1)
LYMPH%: 23.4 % (ref 14.0–49.0)
MCH: 23 pg — ABNORMAL LOW (ref 27.2–33.4)
MCHC: 31.3 g/dL — AB (ref 32.0–36.0)
MCV: 73.5 fL — ABNORMAL LOW (ref 79.3–98.0)
MONO#: 0.6 10*3/uL (ref 0.1–0.9)
MONO%: 7.3 % (ref 0.0–14.0)
NEUT#: 5.7 10*3/uL (ref 1.5–6.5)
NEUT%: 65.8 % (ref 39.0–75.0)
Platelets: 316 10*3/uL (ref 140–400)
RBC: 4.2 10*6/uL (ref 4.20–5.82)
RDW: 17.7 % — ABNORMAL HIGH (ref 11.0–14.6)
WBC: 8.7 10*3/uL (ref 4.0–10.3)
lymph#: 2 10*3/uL (ref 0.9–3.3)

## 2015-09-02 MED ORDER — TIOTROPIUM BROMIDE MONOHYDRATE 18 MCG IN CAPS: ORAL_CAPSULE | RESPIRATORY_TRACT | Status: DC

## 2015-09-02 NOTE — Progress Notes (Signed)
  [facility] OFFICE PROGRESS NOTE   Diagnosis: Gastric cancer  INTERVAL HISTORY:   He returns as scheduled. He continues to have exertional dyspnea. He is using only a Spiriva inhaler. His wife reports he has episodes of increased dyspnea and refuses oxygen therapy. He has an improved appetite. No dysphagia. No bleeding. He reports taking iron once daily.  Objective:  Vital signs in last 24 hours:  Blood pressure 94/69, pulse 81, temperature 98.1 F (36.7 C), temperature source Oral, resp. rate 16, height 5\' 6"  (1.676 m), weight 110 lb 11.2 oz (50.213 kg), SpO2 97 %.    HEENT: Neck without mass Lymphatics: No cervical, supra-clavicular, or axillary nodes Resp: Distant breath sounds, no respiratory distress Cardio: Regular rate and rhythm GI: No hepatomegaly, no mass, nontender Vascular: No leg edema   Lab Results:  Lab Results  Component Value Date   WBC 8.7 09/02/2015   HGB 9.7* 09/02/2015   HCT 30.8* 09/02/2015   MCV 73.5* 09/02/2015   PLT 316 09/02/2015   NEUTROABS 5.7 09/02/2015     Medications: I have reviewed the patient's current medications.  Assessment/Plan: 1. Adenocarcinoma of the stomach. Adenocarcinoma found in the gastric remnant status post previous partial gastrectomy/Billroth II anastomosis. No evidence of metastatic disease by physical exam or CT of the abdomen/pelvis. Staging EUS on 03/30/2012 confirmed a uT3 uN0 tumor. He began radiation and concurrent Xeloda chemotherapy on 04/18/2012. Xeloda was subsequently placed on hold due to chest pain. Xeloda was resumed on 04/26/2012 with no further chest pain. The Xeloda dose was titrated to 1000 mg twice daily beginning 04/28/2012. He completed treatment on 05/30/2012. Restaging CT on 08/01/2012 with no evidence of metastatic disease. -Upper endoscopy 05/08/2013 with abnormal mucosa at the lesser curvature and no tumor mass seen, biopsy negative for malignancy  2. Remote partial  gastrectomy/Billroth II anastomosis secondary to ulcer disease. 3. Iron deficiency anemia secondary to the gastric mass status post red cell transfusion 03/17/2012. Progressive iron deficiency 09/02/2015. 4. COPD. 5. History of colon polyps. 6. History of Chest pain/subxiphoid pain-most likely related to radiation esophagitis/gastritis or the gastric mass 7. Anorexia/weight loss-? Secondary to gastric cancer or COPD, depression 8. Depression-he started a trial of Remeron on 06/13/2012, no longer taking Remeron 9. Dyspnea/low-grade fever at an office visit on 06/20/2012-? COPD flare,? Radiation pneumonitis-he completed a trial of prednisone. The symptoms partially improved. 10. History of Orthostasis 11. History of Dysphagia-none today 12. Indeterminate 8 mm left liver lesion on the CT 08/01/2012.   Disposition:  Mr. Peddie appears unchanged. He has progressive iron deficiency anemia. He agrees to increase the ferrous sulfate to 3 times daily and return for a CBC in one month. He will be scheduled for an office visit in 4 months.  He declines influenza and pneumonia vaccines.  I suspect the iron deficiency is related to bleeding from persistent tumor in the stomach.  [name], MD  09/02/2015  4:04 PM

## 2015-09-02 NOTE — Telephone Encounter (Signed)
per pof to sch pt appt-gave pt copy of avs °

## 2015-09-04 ENCOUNTER — Ambulatory Visit: Payer: Medicare Other | Admitting: Internal Medicine

## 2015-09-04 ENCOUNTER — Encounter: Payer: Self-pay | Admitting: Internal Medicine

## 2015-09-04 VITALS — BP 132/56 | HR 76 | Resp 22

## 2015-09-04 DIAGNOSIS — J439 Emphysema, unspecified: Secondary | ICD-10-CM | POA: Diagnosis not present

## 2015-09-04 DIAGNOSIS — E441 Mild protein-calorie malnutrition: Secondary | ICD-10-CM

## 2015-09-04 DIAGNOSIS — I208 Other forms of angina pectoris: Secondary | ICD-10-CM | POA: Insufficient documentation

## 2015-09-04 DIAGNOSIS — F339 Major depressive disorder, recurrent, unspecified: Secondary | ICD-10-CM

## 2015-09-04 MED ORDER — NITROGLYCERIN 0.4 MG/SPRAY TL SOLN
1.0000 | Status: DC | PRN
Start: 2015-09-04 — End: 2015-11-25

## 2015-09-04 MED ORDER — MORPHINE SULFATE (CONCENTRATE) 10 MG /0.5 ML PO SOLN: 5.0000 mg | ORAL | Status: DC | PRN

## 2015-09-04 NOTE — Assessment & Plan Note (Addendum)
Severe episodes of dyspnea happening daily and multiple times some days. Had one here--not hypoxic. Not exertional but must be ischemic  Will try sublingual nitro Worsening anemia noted--- urged him to continue the iron Consider long acting nitroglycerin  Would avoid beta blocker given his lung disease Will give Rx for roxanol just in case

## 2015-09-04 NOTE — Assessment & Plan Note (Signed)
Weight up slightly Wife making him eat

## 2015-09-04 NOTE — Assessment & Plan Note (Signed)
Has voiced suicidal ideation but denies it now Won't allow his weapons to be moved Won't take medication

## 2015-09-04 NOTE — Assessment & Plan Note (Signed)
This does note seem to be the cause of his spells---but he is severely limited due to this No change--spiriva and hasn't been using the albuterol

## 2015-09-04 NOTE — Progress Notes (Signed)
Subjective:    Patient ID: Danny Page, male    DOB: 1935/10/07, 80 y.o.   MRN: [id number]  HPI Initial home visit for declining status and recurrent dyspnea Wife is here Reviewed Dr Gearldine Shown note Iron increased but he still only takes it daily  Wife notes spells of dyspnea--day and night Hard time breathing No chest pain No nausea or vomiting Dizziness only if he bends down quickly and then gets up  Hasn't tried any Rx for spells Last 2-3 minutes Will occasionally recur frequently  Denies depression Only watches TV Never goes out Denies anxiety Does think about death-- and told wife "I want to blow my brains out"--but denies this now  Regular cough--but no increase No fever  Current Outpatient Prescriptions on File Prior to Visit  Medication Sig Dispense Refill  . aspirin 81 MG tablet Take 81 mg by mouth daily.    . ferrous sulfate 325 (65 FE) MG tablet Take 325 mg by mouth daily with breakfast.     . LORazepam (ATIVAN) 0.5 MG tablet Take 1 tablet (0.5 mg total) by mouth every 8 (eight) hours as needed for anxiety. 30 tablet 0  . PROAIR HFA 108 (90 BASE) MCG/ACT inhaler INHALE TWO PUFFS BY MOUTH EVERY 6 HOURS AS NEEDED FOR SHORTNESS OF BREATH/WHEEZE 18 each 0  . tiotropium (SPIRIVA HANDIHALER) 18 MCG inhalation capsule INHALE ONE DOSE BY MOUTH ONCE DAILY 30 capsule 12   No current facility-administered medications on file prior to visit.    No Known Allergies  Past Medical History  Diagnosis Date  . COPD (chronic obstructive pulmonary disease) ([place])   . Post herpetic neuralgia   . Hemorrhoids   . Anemia   . Stomach cancer ([place])     remission  . CAD (coronary artery disease) 9/14    mildly abnormal perfusion study  . BPH (benign prostatic hypertrophy)     Past Surgical History  Procedure Laterality Date  . Stomach surgery      for ulcers, age 86  . Eus  03/30/2012    Procedure: UPPER ENDOSCOPIC ULTRASOUND (EUS) LINEAR;  Surgeon: Milus Banister, MD;   Location: [facility];  Service: Endoscopy;  Laterality: N/A;  . Cataract extraction w/ intraocular lens implant Right 2016    Family History  Problem Relation Age of Onset  . Heart disease Brother   . Cancer Mother 64    breast ca with mets to lymph nodes    Social History   Social History  . Marital Status: Married    Spouse Name: N/A  . Number of Children: N/A  . Years of Education: N/A   Occupational History  . retired, Chief Financial Officer    Social History Main Topics  . Smoking status: Former Smoker -- 45 years    Types: Cigarettes    Quit date: 10/26/2003  . Smokeless tobacco: Never Used  . Alcohol Use: No     Comment: quit about 2004, heavy in the past  . Drug Use: No  . Sexual Activity: Not on file   Other Topics Concern  . Not on file   Social History Narrative   No living will   Requests wife as health care POA--no set form   Requests DNR--- done 01/08/15   Would not want a feeding tube   Review of Systems Appetite is not good--wife tries to get him to eat Sleeping okay No abdominal pain Bowels are slow---uses laxative (docusate) and feels that helps Voids okay  Objective:   Physical Exam  Constitutional: No distress.  Neck: Normal range of motion. No thyromegaly present.  Cardiovascular: Normal rate, regular rhythm and normal heart sounds.  Exam reveals no gallop.   No murmur heard. Pulmonary/Chest: No respiratory distress. He has no wheezes. He has no rales.  Very decreased breath sounds but clear  Abdominal: Soft. There is no tenderness.  Musculoskeletal: He exhibits no edema.  Lymphadenopathy:    He has no cervical adenopathy.  Psychiatric: He has a normal mood and affect. His behavior is normal.          Assessment & Plan:

## 2015-09-30 ENCOUNTER — Other Ambulatory Visit (HOSPITAL_BASED_OUTPATIENT_CLINIC_OR_DEPARTMENT_OTHER): Payer: Medicare Other

## 2015-09-30 DIAGNOSIS — C169 Malignant neoplasm of stomach, unspecified: Secondary | ICD-10-CM | POA: Diagnosis not present

## 2015-09-30 LAB — CBC WITH DIFFERENTIAL/PLATELET
BASO%: 1 % (ref 0.0–2.0)
Basophils Absolute: 0.1 10*3/uL (ref 0.0–0.1)
EOS ABS: 0.1 10*3/uL (ref 0.0–0.5)
EOS%: 1 % (ref 0.0–7.0)
HCT: 34.2 % — ABNORMAL LOW (ref 38.4–49.9)
HGB: 10.6 g/dL — ABNORMAL LOW (ref 13.0–17.1)
LYMPH%: 27.9 % (ref 14.0–49.0)
MCH: 23.4 pg — ABNORMAL LOW (ref 27.2–33.4)
MCHC: 31 g/dL — AB (ref 32.0–36.0)
MCV: 75.5 fL — ABNORMAL LOW (ref 79.3–98.0)
MONO#: 0.8 10*3/uL (ref 0.1–0.9)
MONO%: 8.1 % (ref 0.0–14.0)
NEUT%: 62 % (ref 39.0–75.0)
NEUTROS ABS: 5.9 10*3/uL (ref 1.5–6.5)
NRBC: 0 % (ref 0–0)
PLATELETS: 262 10*3/uL (ref 140–400)
RBC: 4.53 10*6/uL (ref 4.20–5.82)
RDW: 18.3 % — AB (ref 11.0–14.6)
WBC: 9.6 10*3/uL (ref 4.0–10.3)
lymph#: 2.7 10*3/uL (ref 0.9–3.3)

## 2015-10-01 ENCOUNTER — Other Ambulatory Visit: Payer: Self-pay | Admitting: *Deleted

## 2015-10-01 ENCOUNTER — Telehealth: Payer: Self-pay | Admitting: *Deleted

## 2015-10-01 MED ORDER — ALBUTEROL SULFATE HFA 108 (90 BASE) MCG/ACT IN AERS: INHALATION_SPRAY | RESPIRATORY_TRACT | Status: DC

## 2015-10-01 NOTE — Telephone Encounter (Signed)
-----   Message from Ladell Pier, MD sent at 09/30/2015  3:06 PM EST ----- Please call patient, hb is better, continue iron, f/u as scheduled

## 2015-10-01 NOTE — Telephone Encounter (Signed)
Per Dr. Benay Spice; notified pt's wife that hb is better, continue iron, f/u as scheduled.  Pt's wife verbalized understanding and confirmed appt for 12/30/15.

## 2015-10-07 ENCOUNTER — Ambulatory Visit: Payer: Medicare Other | Admitting: Internal Medicine

## 2015-10-13 ENCOUNTER — Telehealth: Payer: Self-pay

## 2015-10-13 NOTE — Telephone Encounter (Signed)
Please check on him  I don't see records from the ER at this point

## 2015-10-13 NOTE — Telephone Encounter (Signed)
PLEASE NOTE: All timestamps contained within this report are represented as [place] Standard Time. CONFIDENTIALTY NOTICE: This fax transmission is intended only for the addressee. It contains information that is legally privileged, confidential or otherwise protected from use or disclosure. If you are not the intended recipient, you are strictly prohibited from reviewing, disclosing, copying using or disseminating any of this information or taking any action in reliance on or regarding this information. If you have received this fax in error, please notify us immediately by telephone so that we can arrange for its return to [place]. Phone: [phone], Toll-Free: [phone], Fax: [phone] Page: 1 of 2 Call Id: [id number] [facility] Patient Name: Danny Page Gender: Male DOB: [birth year] Age: 80 Y 2 M 10 D Return Phone Number: [phone] (Primary) Address: City/State/Zip: [place] Client [place] [facility] - Client Client Site [place] - Day Physician Viviana Simpler Contact Type Call Call Type Triage / [place] Name Letta Median Relationship To Patient Spouse Appointment Disposition EMR Appointment Not Necessary Info pasted into Epic No Return Phone Number [phone] (Primary) Chief Complaint Blood In Stool Initial Comment Caller states her husband has blood in stool PreDisposition Call Doctor Nurse Assessment Nurse: Marcelline Deist, RN, Kermit Balo Date/Time (Eastern Time): 10/13/2015 11:54:26 AM Confirm and document reason for call. If symptomatic, describe symptoms. ---Caller states her husband noticed bright red blood in his stool. Had stomach cancer 3 years ago, but is currently in remission. Has fever. Has the patient traveled out of the country within the last 30 days? ---Not Applicable Does the patient have any new or worsening symptoms? ---Yes Will a  triage be completed? ---Yes Related visit to physician within the last 2 weeks? ---No Does the PT have any chronic conditions? (i.e. diabetes, asthma, etc.) ---Yes List chronic conditions. ---COPD, stomach cancer hx., angina Is this a behavioral health or substance abuse call? ---No Guidelines Guideline Title Affirmed Question Affirmed Notes Nurse Date/Time Eilene Ghazi Time) Rectal Bleeding Pale skin (pallor) of new onset or worsening Marcelline Deist, RN, Kermit Balo 10/13/2015 11:57:17 AM Disp. Time Eilene Ghazi Time) Disposition Final User 10/13/2015 12:01:13 PM Go to ED Now Yes Marcelline Deist, RN, Milana Kidney Understands: Yes PLEASE NOTE: All timestamps contained within this report are represented as [place] Standard Time. CONFIDENTIALTY NOTICE: This fax transmission is intended only for the addressee. It contains information that is legally privileged, confidential or otherwise protected from use or disclosure. If you are not the intended recipient, you are strictly prohibited from reviewing, disclosing, copying using or disseminating any of this information or taking any action in reliance on or regarding this information. If you have received this fax in error, please notify us immediately by telephone so that we can arrange for its return to [place]. Phone: [phone], Toll-Free: [phone], Fax: [phone] Page: 2 of 2 Call Id: [id number] Disagree/Comply: Comply Care Advice Given Per Guideline GO TO ED NOW: You need to be seen in the Emergency Department. Go to the ER at ___________ [place] now. Drive carefully. DRIVING: Another adult should drive. CARE ADVICE given per Rectal Bleeding (Adult) guideline. After Care Instructions Given Call Event Type User Date / Time Description Comments User: Donald Siva, RN Date/Time Eilene Ghazi Time): 10/13/2015 12:08:47 PM Please contact caller at this # or [phone]. She asked whether or not Dr. [name] could make a house call for her husband. She  will proceed to take him to [facility] in [place] to be evaluated.  States he feels dizzy also. Referrals [facility] - ED

## 2015-10-14 NOTE — Telephone Encounter (Signed)
Pt did not go to the ER wanted to schedule appt with Dr. [name], appt scheduled 10/15/2015 at 12:30

## 2015-10-15 ENCOUNTER — Ambulatory Visit (INDEPENDENT_AMBULATORY_CARE_PROVIDER_SITE_OTHER): Payer: Medicare Other | Admitting: Internal Medicine

## 2015-10-15 ENCOUNTER — Encounter: Payer: Self-pay | Admitting: Internal Medicine

## 2015-10-15 VITALS — BP 110/60 | HR 107 | Temp 97.8°F | Wt 108.0 lb

## 2015-10-15 DIAGNOSIS — K625 Hemorrhage of anus and rectum: Secondary | ICD-10-CM | POA: Diagnosis not present

## 2015-10-15 DIAGNOSIS — F339 Major depressive disorder, recurrent, unspecified: Secondary | ICD-10-CM | POA: Diagnosis not present

## 2015-10-15 DIAGNOSIS — J439 Emphysema, unspecified: Secondary | ICD-10-CM | POA: Diagnosis not present

## 2015-10-15 MED ORDER — TIOTROPIUM BROMIDE MONOHYDRATE 18 MCG IN CAPS: ORAL_CAPSULE | RESPIRATORY_TRACT | Status: DC

## 2015-10-15 NOTE — Assessment & Plan Note (Signed)
This may be the reason for the severe dyspnea He should try the roxanol for this--but hasn't

## 2015-10-15 NOTE — Progress Notes (Signed)
Subjective:    Patient ID: Danny Page, male    DOB: June 14, 1935, 80 y.o.   MRN: [id number]  HPI Here with wife Had spell 2 days ago--- had urge to go, going to bathroom and passed gas and was incontinent in underwear. Also went more in bowl--- stool black (is on iron). Blood in the bowl also Went to bathroom and cleaned up with paper towels that were available After the 3rd paper towel--he noticed some blood on the paper towel Several hours later---normal brown stool and no blood  Wife states this is not what happened There was a fair amount of blood and some clots in bowl that first time She didn't see the second one  Hasn't gone today Appetite is still not good Weight is about the same  Having the spells of dyspnea still Hasn't tried NTG or morphine  Current Outpatient Prescriptions on File Prior to Visit  Medication Sig Dispense Refill  . albuterol (PROAIR HFA) 108 (90 BASE) MCG/ACT inhaler INHALE TWO PUFFS BY MOUTH EVERY 6 HOURS AS NEEDED FOR SHORTNESS OF BREATH/WHEEZE 18 each 0  . aspirin 81 MG tablet Take 81 mg by mouth daily.    . ferrous sulfate 325 (65 FE) MG tablet Take 325 mg by mouth daily with breakfast.     . LORazepam (ATIVAN) 0.5 MG tablet Take 1 tablet (0.5 mg total) by mouth every 8 (eight) hours as needed for anxiety. 30 tablet 0  . Morphine Sulfate (MORPHINE CONCENTRATE) 10 mg / 0.5 ml concentrated solution Take 0.25-0.5 mLs (5-10 mg total) by mouth every 2 (two) hours as needed for severe pain or shortness of breath. 30 mL 0  . nitroGLYCERIN (NITROLINGUAL) 0.4 MG/SPRAY spray Place 1 spray under the tongue every 5 (five) minutes x 3 doses as needed for chest pain. 12 g 12  . tiotropium (SPIRIVA HANDIHALER) 18 MCG inhalation capsule INHALE ONE DOSE BY MOUTH ONCE DAILY 30 capsule 12   No current facility-administered medications on file prior to visit.    No Known Allergies  Past Medical History  Diagnosis Date  . COPD (chronic obstructive pulmonary  disease) ([place])   . Post herpetic neuralgia   . Hemorrhoids   . Anemia   . Stomach cancer ([place])     remission  . CAD (coronary artery disease) 9/14    mildly abnormal perfusion study  . BPH (benign prostatic hypertrophy)     Past Surgical History  Procedure Laterality Date  . Stomach surgery      for ulcers, age 64  . Eus  03/30/2012    Procedure: UPPER ENDOSCOPIC ULTRASOUND (EUS) LINEAR;  Surgeon: Milus Banister, MD;  Location: [facility];  Service: Endoscopy;  Laterality: N/A;  . Cataract extraction w/ intraocular lens implant Right 2016    Family History  Problem Relation Age of Onset  . Heart disease Brother   . Cancer Mother 55    breast ca with mets to lymph nodes    Social History   Social History  . Marital Status: Married    Spouse Name: N/A  . Number of Children: N/A  . Years of Education: N/A   Occupational History  . retired, Chief Financial Officer    Social History Main Topics  . Smoking status: Former Smoker -- 45 years    Types: Cigarettes    Quit date: 10/26/2003  . Smokeless tobacco: Never Used  . Alcohol Use: No     Comment: quit about 2004, heavy in the past  .  Drug Use: No  . Sexual Activity: Not on file   Other Topics Concern  . Not on file   Social History Narrative   No living will   Requests wife as health care POA--no set form   Requests DNR--- done 01/08/15   Would not want a feeding tube   Review of Systems Constant flatulence-- discussed low FODMAP diet Felt hot with this bleeding spell--no fever Some cough--no real change History polyps and hemorrhoids in past    Objective:   Physical Exam  Constitutional: No distress.  Neck: Normal range of motion. Neck supple. No thyromegaly present.  Cardiovascular: Normal rate, regular rhythm and normal heart sounds.  Exam reveals no gallop.   No murmur heard. Pulmonary/Chest: No respiratory distress. He has no wheezes. He has no rales.  Almost no air movement but not tight  Abdominal: Soft.  Bowel sounds are normal. He exhibits no distension. There is no tenderness. There is no rebound and no guarding.  Genitourinary:  No rectal lesions or hemorrhoids Prostate feels okay Firm brown stool that is mildly heme positive  Lymphadenopathy:    He has no cervical adenopathy.          Assessment & Plan:

## 2015-10-15 NOTE — Progress Notes (Signed)
Pre visit review using our clinic review tool, if applicable. No additional management support is needed unless otherwise documented below in the visit note. 

## 2015-10-15 NOTE — Assessment & Plan Note (Signed)
Ongoing mood issues but not suicidal

## 2015-10-15 NOTE — Patient Instructions (Signed)
Let me know if you change your mind and would like to go ahead with the GI evaluation.

## 2015-10-15 NOTE — Assessment & Plan Note (Addendum)
Had burst of significant blood that then stopped This history is most consistent with diverticular bleed--but can't exclude cancer Discussed options--he seems to welcome death at times---but not ready He is not willing to have GI evaluation--will set up at [facility] if this recurs or he changes his mind -- to exclude worrisome lesion Discussed that this could be cancer (though unlikely) and he still doesn't want evaluation

## 2015-11-25 ENCOUNTER — Ambulatory Visit (INDEPENDENT_AMBULATORY_CARE_PROVIDER_SITE_OTHER): Payer: Medicare Other | Admitting: Internal Medicine

## 2015-11-25 ENCOUNTER — Encounter: Payer: Self-pay | Admitting: Internal Medicine

## 2015-11-25 VITALS — BP 110/60 | HR 93 | Temp 98.1°F | Wt 109.0 lb

## 2015-11-25 DIAGNOSIS — C169 Malignant neoplasm of stomach, unspecified: Secondary | ICD-10-CM | POA: Diagnosis not present

## 2015-11-25 DIAGNOSIS — J439 Emphysema, unspecified: Secondary | ICD-10-CM

## 2015-11-25 DIAGNOSIS — R1032 Left lower quadrant pain: Secondary | ICD-10-CM

## 2015-11-25 DIAGNOSIS — F339 Major depressive disorder, recurrent, unspecified: Secondary | ICD-10-CM | POA: Diagnosis not present

## 2015-11-25 DIAGNOSIS — E441 Mild protein-calorie malnutrition: Secondary | ICD-10-CM

## 2015-11-25 LAB — COMPREHENSIVE METABOLIC PANEL
ALK PHOS: 87 U/L (ref 39–117)
ALT: 8 U/L (ref 0–53)
AST: 13 U/L (ref 0–37)
Albumin: 3.7 g/dL (ref 3.5–5.2)
BUN: 17 mg/dL (ref 6–23)
CALCIUM: 9.2 mg/dL (ref 8.4–10.5)
CO2: 31 mEq/L (ref 19–32)
Chloride: 102 mEq/L (ref 96–112)
Creatinine, Ser: 0.98 mg/dL (ref 0.40–1.50)
GFR: 78.56 mL/min (ref >60.00)
GLUCOSE: 99 mg/dL (ref 70–99)
POTASSIUM: 4.7 meq/L (ref 3.5–5.1)
Sodium: 138 mEq/L (ref 135–145)
TOTAL PROTEIN: 6.7 g/dL (ref 6.0–8.3)
Total Bilirubin: 0.3 mg/dL (ref 0.2–1.2)

## 2015-11-25 LAB — CBC WITH DIFFERENTIAL/PLATELET
Basophils Absolute: 0.1 10*3/uL (ref 0.0–0.1)
Basophils Relative: 1.2 % (ref 0.0–3.0)
EOS PCT: 0.6 % (ref 0.0–5.0)
Eosinophils Absolute: 0 10*3/uL (ref 0.0–0.7)
HCT: 35.3 % — ABNORMAL LOW (ref 39.0–52.0)
Hemoglobin: 10.9 g/dL — ABNORMAL LOW (ref 13.0–17.0)
LYMPHS ABS: 1.7 10*3/uL (ref 0.7–4.0)
Lymphocytes Relative: 25 % (ref 12.0–46.0)
MCHC: 31 g/dL (ref 30.0–36.0)
MCV: 74.5 fl — AB (ref 78.0–100.0)
MONOS PCT: 7 % (ref 3.0–12.0)
Monocytes Absolute: 0.5 10*3/uL (ref 0.1–1.0)
NEUTROS ABS: 4.5 10*3/uL (ref 1.4–7.7)
NEUTROS PCT: 66.2 % (ref 43.0–77.0)
Platelets: 319 10*3/uL (ref 150.0–400.0)
RBC: 4.74 Mil/uL (ref 4.22–5.81)
RDW: 21.2 % — ABNORMAL HIGH (ref 11.5–15.5)
WBC: 6.7 10*3/uL (ref 4.0–10.5)

## 2015-11-25 LAB — LIPASE: LIPASE: 5 U/L — AB (ref 11.0–59.0)

## 2015-11-25 NOTE — Assessment & Plan Note (Signed)
Won't take meds Ongoing symptoms but no worse No suicidal ideation

## 2015-11-25 NOTE — Assessment & Plan Note (Signed)
Concern for recurrence or possible side effects from RT Will check CT

## 2015-11-25 NOTE — Assessment & Plan Note (Addendum)
And LMQ No further bleeding apparent though Concern for recurrence of cancer--will check labs and CT scan ?diverticulitis?? No fever and appetite (though poor) hasn't changed Alert Dr Benay Spice Fortunately--hasn't lost more weight though

## 2015-11-25 NOTE — Assessment & Plan Note (Signed)
Severe symptoms still but won't take any other meds

## 2015-11-25 NOTE — Assessment & Plan Note (Signed)
Won't take supplements

## 2015-11-25 NOTE — Progress Notes (Signed)
Pre visit review using our clinic review tool, if applicable. No additional management support is needed unless otherwise documented below in the visit note. 

## 2015-11-25 NOTE — Progress Notes (Signed)
Subjective:    Patient ID: Danny Page, male    DOB: [birth year], 81 y.o.   MRN: [id number]  HPI Here for follow up of chronic medical conditions With wife as usual  Has some pain in LLQ 2& 3 days ago Still there now but better Thinks it is related to the radiation for his stomach cancer No apparent bleeding (he denies but won't talk about it to wife)  Appetite is not good Weight is stable though  Breathing is about the same Only on spiriva Still gets breathless spells--but not as bad. Will have to bend over to catch breath at times No fever or cough---but feels cold often  Current Outpatient Prescriptions on File Prior to Visit  Medication Sig Dispense Refill  . ferrous sulfate 325 (65 FE) MG tablet Take 325 mg by mouth daily with breakfast.     . tiotropium (SPIRIVA HANDIHALER) 18 MCG inhalation capsule INHALE ONE DOSE BY MOUTH ONCE DAILY 30 capsule 12   No current facility-administered medications on file prior to visit.    No Known Allergies  Past Medical History  Diagnosis Date  . COPD (chronic obstructive pulmonary disease) ([place])   . Post herpetic neuralgia   . Hemorrhoids   . Anemia   . Stomach cancer ([place])     remission  . CAD (coronary artery disease) 9/14    mildly abnormal perfusion study  . BPH (benign prostatic hypertrophy)     Past Surgical History  Procedure Laterality Date  . Stomach surgery      for ulcers, age 59  . Eus  03/30/2012    Procedure: UPPER ENDOSCOPIC ULTRASOUND (EUS) LINEAR;  Surgeon: Milus Banister, MD;  Location: [facility];  Service: Endoscopy;  Laterality: N/A;  . Cataract extraction w/ intraocular lens implant Right 2016    Family History  Problem Relation Age of Onset  . Heart disease Brother   . Cancer Mother 27    breast ca with mets to lymph nodes    Social History   Social History  . Marital Status: Married    Spouse Name: N/A  . Number of Children: N/A  . Years of Education: N/A   Occupational History    . retired, Chief Financial Officer    Social History Main Topics  . Smoking status: Former Smoker -- 45 years    Types: Cigarettes    Quit date: 10/26/2003  . Smokeless tobacco: Never Used  . Alcohol Use: No     Comment: quit about 2004, heavy in the past  . Drug Use: No  . Sexual Activity: Not on file   Other Topics Concern  . Not on file   Social History Narrative   No living will   Requests wife as health care POA--no set form   Requests DNR--- done 01/08/15   Would not want a feeding tube   Review of Systems  Sleeps okay Still anhedonic--doesn't go out     Objective:   Physical Exam  Constitutional: No distress.  Neck: Normal range of motion. No thyromegaly present.  Cardiovascular: Normal rate, regular rhythm and normal heart sounds.  Exam reveals no gallop.   No murmur heard. Pulmonary/Chest: Effort normal. No respiratory distress. He has no wheezes. He has no rales.  Decreased breath sounds but clear  Abdominal: Soft. Bowel sounds are normal. He exhibits no distension and no mass. There is no rebound and no guarding.  LLQ/LMQ tenderness   Musculoskeletal: He exhibits no edema.  Lymphadenopathy:  He has no cervical adenopathy.  Psychiatric:  Usual psychomotor retardation          Assessment & Plan:

## 2015-11-26 ENCOUNTER — Ambulatory Visit (INDEPENDENT_AMBULATORY_CARE_PROVIDER_SITE_OTHER)
Admission: RE | Admit: 2015-11-26 | Discharge: 2015-11-26 | Disposition: A | Discharge status: Home / Self Care | Payer: Medicare Other | Source: Ambulatory Visit | Attending: Internal Medicine | Admitting: Internal Medicine

## 2015-11-26 DIAGNOSIS — C169 Malignant neoplasm of stomach, unspecified: Secondary | ICD-10-CM

## 2015-11-26 DIAGNOSIS — R1032 Left lower quadrant pain: Secondary | ICD-10-CM | POA: Diagnosis not present

## 2015-11-26 DIAGNOSIS — C159 Malignant neoplasm of esophagus, unspecified: Secondary | ICD-10-CM | POA: Diagnosis not present

## 2015-11-26 MED ORDER — IOHEXOL 300 MG/ML  SOLN
100.0000 mL | Freq: Once | INTRAMUSCULAR | Status: AC | PRN
  Administered 2015-11-26: 100 mL via INTRAVENOUS

## 2015-12-01 ENCOUNTER — Other Ambulatory Visit: Payer: Self-pay | Admitting: *Deleted

## 2015-12-01 NOTE — Progress Notes (Signed)
MD message to schedule to be seen within 2 weeks s/p CT scan. POF sent to scheduling dept.

## 2015-12-02 ENCOUNTER — Other Ambulatory Visit: Payer: Self-pay | Admitting: *Deleted

## 2015-12-16 ENCOUNTER — Ambulatory Visit (HOSPITAL_BASED_OUTPATIENT_CLINIC_OR_DEPARTMENT_OTHER): Payer: Medicare Other | Admitting: Oncology

## 2015-12-16 ENCOUNTER — Telehealth: Payer: Self-pay | Admitting: Oncology

## 2015-12-16 VITALS — BP 84/41 | HR 101 | Temp 98.4°F | Resp 18 | Ht 66.0 in | Wt 107.6 lb

## 2015-12-16 DIAGNOSIS — D500 Iron deficiency anemia secondary to blood loss (chronic): Secondary | ICD-10-CM

## 2015-12-16 DIAGNOSIS — C169 Malignant neoplasm of stomach, unspecified: Secondary | ICD-10-CM

## 2015-12-16 DIAGNOSIS — D509 Iron deficiency anemia, unspecified: Secondary | ICD-10-CM

## 2015-12-16 NOTE — Progress Notes (Signed)
Danny Page, Danny deficiency anemia  INTERVAL HISTORY:   Danny Page returns prior to a scheduled visit. He saw Danny Page a few weeks ago with a complaint of left abdominal pain. The pain had been present for approximately 4 days. He reports the pain has resolved. He denies dysphagia. He reports a single episode of rectal Page, Danny Page dyspnea, a poor appetite, and a limited activity level.  A CT of the abdomen and pelvis 11/26/2015 revealed increased thickening along the lesser curvature compared to a CT from October 2013 concerning for progression of gastric Page. A focus of thickening was noted in the anterior bladder to the left of midline. The prostate is enlarged and inhomogeneous.  Objective:  Vital signs in last 24 hours:  Blood pressure 84/41, pulse 101, temperature 98.4 F (36.9 C), temperature source Oral, resp. rate 18, height 5\' 6"  (1.676 m), weight 107 lb 9.6 oz (48.807 kg), SpO2 95 %.    HEENT: Neck without mass Lymphatics: No cervical, supraclavicular, axillary, or inguinal nodes Resp: Distant breath sounds, no respiratory distress Cardio: Regular rate and rhythm GI: No hepatomegaly, no mass, nontender Vascular: No leg edema   Lab Results:  Lab Results  Component Value Date   WBC 6.7 11/25/2015   HGB 10.9* 11/25/2015   HCT 35.3* 11/25/2015   MCV 74.5* 11/25/2015   PLT 319.0 11/25/2015   NEUTROABS 4.5 11/25/2015     Imaging:  CT abdomen/pelvis 11/26/2015-images reviewed with Danny Page and his wife.   Medications: I have reviewed the patient's current medications.  Assessment/Plan: 1. Adenocarcinoma of the stomach. Adenocarcinoma found in the gastric remnant status post previous partial gastrectomy/Billroth II anastomosis. No evidence of metastatic disease by physical exam or CT of the abdomen/pelvis. Staging EUS on 03/30/2012 confirmed a  uT3 uN0 tumor. He began radiation and concurrent Xeloda chemotherapy on 04/18/2012. Xeloda was subsequently placed on hold due to chest pain. Xeloda was resumed on 04/26/2012 with no further chest pain. The Xeloda dose was titrated to 1000 mg twice daily beginning 04/28/2012. He completed treatment on 05/30/2012. Restaging CT on 08/01/2012 with no evidence of metastatic disease. -Upper endoscopy 05/08/2013 with abnormal mucosa at the lesser curvature and no tumor mass seen, biopsy negative for malignancy  -CT 11/26/2015 revealed increased gastric wall thickening and thickening at the anterior bladder 2. Remote partial gastrectomy/Billroth II anastomosis secondary to ulcer disease. 3. 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  4. COPD. 5. History of colon polyps. 6. History of Chest pain/subxiphoid pain-most likely related to radiation esophagitis/gastritis or the gastric mass 7. Anorexia/weight loss-? Secondary to gastric Page or COPD, depression 8. Depression-he started a trial of Remeron on 06/13/2012, no longer taking Remeron 9. Dyspnea/low-grade fever at an office visit on 06/20/2012-? COPD flare,? Radiation pneumonitis-he completed a trial of prednisone. The symptoms partially improved. 10. History of Orthostasis 11. History of Dysphagia-none today 12. Indeterminate 8 mm left liver lesion on the CT 08/01/2012.   Disposition:  Danny Page has a history of gastric Page. He has Danny deficiency anemia. He will increase the Danny to twice daily.  I reviewed the CT images with Danny Page and his wife. I recommend a referral to Danny Page for a repeat upper endoscopy and a urology referral for a cystoscopy. He declines any intervention at present. He states that he will not agree to treatment if Page  is found.  He will contact us for recurrent pain or new symptoms. He will return for an office visit and CBC in 4 months.  Danny Coder, MD  12/16/2015  12:41 PM

## 2015-12-16 NOTE — Telephone Encounter (Signed)
Pt confirmed labs/ov per 02/21 POF, gave pt AVS and Calendar... KJ °

## 2015-12-30 ENCOUNTER — Ambulatory Visit: Payer: Medicare Other | Admitting: Oncology

## 2015-12-30 ENCOUNTER — Other Ambulatory Visit: Payer: Medicare Other

## 2016-01-05 ENCOUNTER — Emergency Department: Payer: Medicare Other

## 2016-01-05 ENCOUNTER — Encounter: Payer: Self-pay | Admitting: Emergency Medicine

## 2016-01-05 ENCOUNTER — Inpatient Hospital Stay
Admission: EM | Admit: 2016-01-05 | Discharge: 2016-01-08 | DRG: 190 | Disposition: A | Discharge status: Home / Self Care | Payer: Medicare Other | Attending: Internal Medicine | Admitting: Internal Medicine

## 2016-01-05 ENCOUNTER — Telehealth: Payer: Self-pay | Admitting: Internal Medicine

## 2016-01-05 DIAGNOSIS — Z9889 Other specified postprocedural states: Secondary | ICD-10-CM | POA: Diagnosis not present

## 2016-01-05 DIAGNOSIS — Z9841 Cataract extraction status, right eye: Secondary | ICD-10-CM

## 2016-01-05 DIAGNOSIS — D649 Anemia, unspecified: Secondary | ICD-10-CM | POA: Diagnosis not present

## 2016-01-05 DIAGNOSIS — J441 Chronic obstructive pulmonary disease with (acute) exacerbation: Secondary | ICD-10-CM | POA: Diagnosis not present

## 2016-01-05 DIAGNOSIS — Z681 Body mass index (BMI) 19 or less, adult: Secondary | ICD-10-CM

## 2016-01-05 DIAGNOSIS — Z85028 Personal history of other malignant neoplasm of stomach: Secondary | ICD-10-CM | POA: Diagnosis not present

## 2016-01-05 DIAGNOSIS — Z87891 Personal history of nicotine dependence: Secondary | ICD-10-CM

## 2016-01-05 DIAGNOSIS — Z8249 Family history of ischemic heart disease and other diseases of the circulatory system: Secondary | ICD-10-CM | POA: Diagnosis not present

## 2016-01-05 DIAGNOSIS — J449 Chronic obstructive pulmonary disease, unspecified: Secondary | ICD-10-CM | POA: Diagnosis not present

## 2016-01-05 DIAGNOSIS — Z803 Family history of malignant neoplasm of breast: Secondary | ICD-10-CM | POA: Diagnosis not present

## 2016-01-05 DIAGNOSIS — E43 Unspecified severe protein-calorie malnutrition: Secondary | ICD-10-CM | POA: Insufficient documentation

## 2016-01-05 DIAGNOSIS — N400 Enlarged prostate without lower urinary tract symptoms: Secondary | ICD-10-CM | POA: Diagnosis present

## 2016-01-05 DIAGNOSIS — R0602 Shortness of breath: Secondary | ICD-10-CM | POA: Diagnosis not present

## 2016-01-05 DIAGNOSIS — Z79899 Other long term (current) drug therapy: Secondary | ICD-10-CM | POA: Diagnosis not present

## 2016-01-05 DIAGNOSIS — I2510 Atherosclerotic heart disease of native coronary artery without angina pectoris: Secondary | ICD-10-CM | POA: Diagnosis present

## 2016-01-05 DIAGNOSIS — Z961 Presence of intraocular lens: Secondary | ICD-10-CM | POA: Diagnosis present

## 2016-01-05 DIAGNOSIS — R05 Cough: Secondary | ICD-10-CM | POA: Diagnosis not present

## 2016-01-05 DIAGNOSIS — J440 Chronic obstructive pulmonary disease with acute lower respiratory infection: Secondary | ICD-10-CM | POA: Diagnosis present

## 2016-01-05 DIAGNOSIS — R0902 Hypoxemia: Secondary | ICD-10-CM | POA: Diagnosis present

## 2016-01-05 DIAGNOSIS — J209 Acute bronchitis, unspecified: Secondary | ICD-10-CM | POA: Diagnosis not present

## 2016-01-05 LAB — COMPREHENSIVE METABOLIC PANEL
ALK PHOS: 119 U/L (ref 38–126)
ALT: 12 U/L — AB (ref 17–63)
AST: 33 U/L (ref 15–41)
Albumin: 4.2 g/dL (ref 3.5–5.0)
Anion gap: 12 (ref 5–15)
BILIRUBIN TOTAL: 0.6 mg/dL (ref 0.3–1.2)
BUN: 16 mg/dL (ref 6–20)
CALCIUM: 9.2 mg/dL (ref 8.9–10.3)
CO2: 26 mmol/L (ref 22–32)
CREATININE: 1.08 mg/dL (ref 0.61–1.24)
Chloride: 94 mmol/L — ABNORMAL LOW (ref 101–111)
GFR calc Af Amer: >60 mL/min (ref >60)
Glucose, Bld: 150 mg/dL — ABNORMAL HIGH (ref 65–99)
Potassium: 4.2 mmol/L (ref 3.5–5.1)
Sodium: 132 mmol/L — ABNORMAL LOW (ref 135–145)
TOTAL PROTEIN: 8.5 g/dL — AB (ref 6.5–8.1)

## 2016-01-05 LAB — BLOOD GAS, ARTERIAL
ALLENS TEST (PASS/FAIL): POSITIVE — AB
Acid-base deficit: 1.2 mmol/L (ref 0.0–2.0)
BICARBONATE: 24.3 meq/L (ref 21.0–28.0)
FIO2: 0.28
O2 SAT: 95.4 %
PATIENT TEMPERATURE: 37
PO2 ART: 81 mmHg — AB (ref 83.0–108.0)
pCO2 arterial: 43 mmHg (ref 32.0–48.0)
pH, Arterial: 7.36 (ref 7.350–7.450)

## 2016-01-05 LAB — CBC WITH DIFFERENTIAL/PLATELET
BASOS PCT: 0 %
Basophils Absolute: 0 10*3/uL (ref 0–0.1)
EOS ABS: 0 10*3/uL (ref 0–0.7)
EOS PCT: 0 %
HCT: 41.6 % (ref 40.0–52.0)
Hemoglobin: 13.4 g/dL (ref 13.0–18.0)
LYMPHS ABS: 0.9 10*3/uL — AB (ref 1.0–3.6)
Lymphocytes Relative: 13 %
MCH: 24.2 pg — AB (ref 26.0–34.0)
MCHC: 32.2 g/dL (ref 32.0–36.0)
MCV: 75.4 fL — ABNORMAL LOW (ref 80.0–100.0)
Monocytes Absolute: 0.5 10*3/uL (ref 0.2–1.0)
Monocytes Relative: 7 %
Neutro Abs: 5.2 10*3/uL (ref 1.4–6.5)
Neutrophils Relative %: 80 %
PLATELETS: 228 10*3/uL (ref 150–440)
RBC: 5.52 MIL/uL (ref 4.40–5.90)
RDW: 20.6 % — ABNORMAL HIGH (ref 11.5–14.5)
WBC: 6.5 10*3/uL (ref 3.8–10.6)

## 2016-01-05 MED ORDER — IPRATROPIUM-ALBUTEROL 0.5-2.5 (3) MG/3ML IN SOLN
3.0000 mL | Freq: Once | RESPIRATORY_TRACT | Status: AC
  Administered 2016-01-05: 3 mL via RESPIRATORY_TRACT
  Filled 2016-01-05: qty 3

## 2016-01-05 MED ORDER — AZITHROMYCIN 250 MG PO TABS
250.0000 mg | ORAL_TABLET | Freq: Every day | ORAL | Status: DC
  Administered 2016-01-05 – 2016-01-08 (×4): 250 mg via ORAL
  Filled 2016-01-05 (×4): qty 1

## 2016-01-05 MED ORDER — FERROUS SULFATE 325 (65 FE) MG PO TABS
650.0000 mg | ORAL_TABLET | Freq: Every day | ORAL | Status: DC
  Administered 2016-01-06 – 2016-01-08 (×3): 650 mg via ORAL
  Filled 2016-01-05 (×3): qty 2

## 2016-01-05 MED ORDER — ACETAMINOPHEN 325 MG PO TABS: 650.0000 mg | ORAL_TABLET | Freq: Four times a day (QID) | ORAL | Status: DC | PRN

## 2016-01-05 MED ORDER — GUAIFENESIN-DM 100-10 MG/5ML PO SYRP: 5.0000 mL | ORAL_SOLUTION | ORAL | Status: DC | PRN

## 2016-01-05 MED ORDER — ENOXAPARIN SODIUM 30 MG/0.3ML ~~LOC~~ SOLN
30.0000 mg | SUBCUTANEOUS | Status: DC
  Administered 2016-01-05 – 2016-01-07 (×3): 30 mg via SUBCUTANEOUS
  Filled 2016-01-05 (×3): qty 0.3

## 2016-01-05 MED ORDER — PREDNISONE 20 MG PO TABS: 40.0000 mg | ORAL_TABLET | Freq: Every day | ORAL | Status: DC

## 2016-01-05 MED ORDER — BUDESONIDE 0.25 MG/2ML IN SUSP
0.2500 mg | Freq: Two times a day (BID) | RESPIRATORY_TRACT | Status: DC
  Administered 2016-01-05 – 2016-01-08 (×6): 0.25 mg via RESPIRATORY_TRACT
  Filled 2016-01-05 (×6): qty 2

## 2016-01-05 MED ORDER — LEVOFLOXACIN 750 MG PO TABS: 750.0000 mg | ORAL_TABLET | Freq: Every day | ORAL | Status: DC

## 2016-01-05 MED ORDER — GUAIFENESIN ER 600 MG PO TB12
600.0000 mg | ORAL_TABLET | Freq: Two times a day (BID) | ORAL | Status: DC
  Administered 2016-01-05 – 2016-01-08 (×6): 600 mg via ORAL
  Filled 2016-01-05 (×6): qty 1

## 2016-01-05 MED ORDER — ONDANSETRON HCL 4 MG PO TABS: 4.0000 mg | ORAL_TABLET | Freq: Four times a day (QID) | ORAL | Status: DC | PRN

## 2016-01-05 MED ORDER — ACETAMINOPHEN 650 MG RE SUPP: 650.0000 mg | Freq: Four times a day (QID) | RECTAL | Status: DC | PRN

## 2016-01-05 MED ORDER — LORAZEPAM 2 MG/ML IJ SOLN
0.5000 mg | Freq: Once | INTRAMUSCULAR | Status: AC
  Administered 2016-01-05: 0.5 mg via INTRAVENOUS
  Filled 2016-01-05: qty 1

## 2016-01-05 MED ORDER — ONDANSETRON HCL 4 MG/2ML IJ SOLN: 4.0000 mg | Freq: Four times a day (QID) | INTRAMUSCULAR | Status: DC | PRN

## 2016-01-05 MED ORDER — SODIUM CHLORIDE 0.9 % IV BOLUS (SEPSIS)
500.0000 mL | Freq: Once | INTRAVENOUS | Status: AC
Start: 2016-01-05 — End: 2016-01-05
  Administered 2016-01-05: 500 mL via INTRAVENOUS

## 2016-01-05 MED ORDER — ALBUTEROL SULFATE HFA 108 (90 BASE) MCG/ACT IN AERS: 2.0000 | INHALATION_SPRAY | Freq: Four times a day (QID) | RESPIRATORY_TRACT | Status: DC | PRN

## 2016-01-05 MED ORDER — METHYLPREDNISOLONE SODIUM SUCC 40 MG IJ SOLR
40.0000 mg | Freq: Two times a day (BID) | INTRAMUSCULAR | Status: AC
  Administered 2016-01-05 – 2016-01-07 (×5): 40 mg via INTRAVENOUS
  Filled 2016-01-05 (×5): qty 1

## 2016-01-05 MED ORDER — METHYLPREDNISOLONE SODIUM SUCC 125 MG IJ SOLR
125.0000 mg | Freq: Once | INTRAMUSCULAR | Status: AC
  Administered 2016-01-05: 125 mg via INTRAVENOUS
  Filled 2016-01-05: qty 2

## 2016-01-05 MED ORDER — IPRATROPIUM-ALBUTEROL 0.5-2.5 (3) MG/3ML IN SOLN
3.0000 mL | Freq: Four times a day (QID) | RESPIRATORY_TRACT | Status: DC
  Administered 2016-01-05 – 2016-01-07 (×5): 3 mL via RESPIRATORY_TRACT
  Filled 2016-01-05 (×6): qty 3

## 2016-01-05 NOTE — ED Notes (Signed)
Patient doing better after medication. Given [place] sandwich tray. Will continue to monitor.

## 2016-01-05 NOTE — Progress Notes (Signed)
Pt arrived on unit. VSS.  Danny Page   

## 2016-01-05 NOTE — Telephone Encounter (Signed)
Spoke to patient's wife, Letta Median, per [place]. She said the person she spoke to earlier advised her to call EMS. She said she is getting him cleaned up and was calling EMS to take him to the ER.

## 2016-01-05 NOTE — H&P (Signed)
[facility] NAME: Danny Page    MR#:  [id number]  DATE OF BIRTH:  April 15, 1935  DATE OF ADMISSION:  01/05/2016  PRIMARY CARE PHYSICIAN: Viviana Simpler, MD   REQUESTING/REFERRING PHYSICIAN: Dr. Meade Maw  CHIEF COMPLAINT:   Chief Complaint  Patient presents with  . Shortness of Breath    HISTORY OF PRESENT ILLNESS:  Danny Page  is a 81 y.o. male with a known history of COPD, stomach cancer, history of coronary artery disease, BPH, chronic anemia who presents to the hospital due to shortness of breath and cough and noted to be hypoxic. Patient says that he has been having significant shortness of breath which has gotten worse over the past 3-4 days. He admits to a cough which is productive with yellow sputum and usually after coughing paroxysms he gets significantly short of breath. He denies any fever, chills, sick contacts. He denies any chest pain, nausea, vomiting, abdominal pain or any other associated symptoms. Patient presented to the hospital was noted to be hypoxic with room air O2 sats in the mid 80s and he was noted to be in COPD exacerbation and hospitalist services were contacted for further treatment and evaluation.  PAST MEDICAL HISTORY:   Past Medical History  Diagnosis Date  . COPD (chronic obstructive pulmonary disease) ([place])   . Post herpetic neuralgia   . Hemorrhoids   . Anemia   . Stomach cancer ([place])     remission  . CAD (coronary artery disease) 9/14    mildly abnormal perfusion study  . BPH (benign prostatic hypertrophy)     PAST SURGICAL HISTORY:   Past Surgical History  Procedure Laterality Date  . Stomach surgery      for ulcers, age 89  . Eus  03/30/2012    Procedure: UPPER ENDOSCOPIC ULTRASOUND (EUS) LINEAR;  Surgeon: Milus Banister, MD;  Location: [facility];  Service: Endoscopy;  Laterality: N/A;  . Cataract extraction w/ intraocular lens implant Right 2016    SOCIAL HISTORY:    Social History  Substance Use Topics  . Smoking status: Former Smoker -- 45 years    Types: Cigarettes    Quit date: 10/26/2003  . Smokeless tobacco: Never Used  . Alcohol Use: No     Comment: quit about 2004, heavy in the past    FAMILY HISTORY:   Family History  Problem Relation Age of Onset  . Heart disease Brother   . Cancer Mother 55    breast ca with mets to lymph nodes    DRUG ALLERGIES:  No Known Allergies  REVIEW OF SYSTEMS:   Review of Systems  Constitutional: Negative for fever and weight loss.  HENT: Negative for congestion, nosebleeds and tinnitus.   Eyes: Negative for blurred vision, double vision and redness.  Respiratory: Positive for cough, sputum production, shortness of breath and wheezing. Negative for hemoptysis.   Cardiovascular: Negative for chest pain, orthopnea, leg swelling and PND.  Gastrointestinal: Negative for nausea, vomiting, abdominal pain, diarrhea and melena.  Genitourinary: Negative for dysuria, urgency and hematuria.  Musculoskeletal: Negative for joint pain and falls.  Neurological: Negative for dizziness, tingling, sensory change, focal weakness, seizures, weakness and headaches.  Endo/Heme/Allergies: Negative for polydipsia. Does not bruise/bleed easily.  Psychiatric/Behavioral: Negative for depression and memory loss. The patient is not nervous/anxious.     MEDICATIONS AT HOME:   Prior to Admission medications   Medication Sig Start Date End Date Taking? Authorizing Provider  ferrous sulfate 325 (65 FE) MG tablet Take 650 mg by mouth daily with breakfast.    Yes Historical Provider, MD  tiotropium (SPIRIVA) 18 MCG inhalation capsule Place 18 mcg into inhaler and inhale daily.   Yes Historical Provider, MD  albuterol (PROVENTIL HFA;VENTOLIN HFA) 108 (90 Base) MCG/ACT inhaler Inhale 2 puffs into the lungs every 6 (six) hours as needed for wheezing or shortness of breath. 01/05/16   Daymon Larsen, MD  levofloxacin (LEVAQUIN) 750  MG tablet Take 1 tablet (750 mg total) by mouth daily. 01/05/16   Daymon Larsen, MD  predniSONE (DELTASONE) 20 MG tablet Take 2 tablets (40 mg total) by mouth daily. 01/06/16 01/10/17  Daymon Larsen, MD      VITAL SIGNS:  Blood pressure 134/71, pulse 112, temperature 98 F (36.7 C), temperature source Oral, resp. rate 24, height 5\' 6"  (1.676 m), weight 46.72 kg (103 lb), SpO2 91 %.  PHYSICAL EXAMINATION:  Physical Exam  GENERAL:  81 y.o.-year-old cachectic patient lying in the bed in mild respiratory distress  EYES: Pupils equal, round, reactive to light and accommodation. No scleral icterus. Extraocular muscles intact.  HEENT: Head atraumatic, normocephalic. Oropharynx and nasopharynx clear. No oropharyngeal erythema, moist oral mucosa  NECK:  Supple, no jugular venous distention. No thyroid enlargement, no tenderness.  LUNGS: Prolonged inspiratory and expiratory phase. End expiratory wheezing, rhonchi bilaterally. Positive use of accessory muscles.  CARDIOVASCULAR: S1, S2 RRR. No murmurs, rubs, gallops, clicks.  ABDOMEN: Soft, nontender, nondistended. Bowel sounds present. No organomegaly or mass.  EXTREMITIES: No pedal edema, cyanosis, or clubbing. + 2 pedal & radial pulses b/l.   NEUROLOGIC: Cranial nerves II through XII are intact. No focal Motor or sensory deficits appreciated b/l PSYCHIATRIC: The patient is alert and oriented x 3.  SKIN: No obvious rash, lesion, or ulcer.   LABORATORY PANEL:   CBC  Recent Labs Lab 01/05/16 1149  WBC 6.5  HGB 13.4  HCT 41.6  PLT 228   ------------------------------------------------------------------------------------------------------------------  Chemistries   Recent Labs Lab 01/05/16 1149  NA 132*  K 4.2  CL 94*  CO2 26  GLUCOSE 150*  BUN 16  CREATININE 1.08  CALCIUM 9.2  AST 33  ALT 12*  ALKPHOS 119  BILITOT 0.6    ------------------------------------------------------------------------------------------------------------------  Cardiac Enzymes No results for input(s): TROPONINI in the last 168 hours. ------------------------------------------------------------------------------------------------------------------  RADIOLOGY:  Dg Chest 2 View  01/05/2016  CLINICAL DATA:  Shortness of breath, cough, and chest congestion for 3 days, history COPD, gastric cancer, coronary artery disease, former smoker EXAM: CHEST  2 VIEW COMPARISON:  06/20/2012 FINDINGS: Normal heart size, mediastinal contours, and pulmonary vascularity. Emphysematous and minimal bronchitic changes consistent with COPD. Lungs otherwise clear. No pulmonary infiltrate, pleural effusion or pneumothorax. Bones demineralized. IMPRESSION: COPD changes. No acute abnormalities. Electronically Signed   By: Lavonia Dana M.D.   On: 01/05/2016 12:44     IMPRESSION AND PLAN:   81 year old male with past medical history of COPD, stomach cancer, coronary artery disease, chronic anemia who presents to the hospital due to shortness of breath and cough and noted to be in COPD exacerbation.  #1 COPD exacerbation-due to underlying acute bronchitis.   -I will start patient on IV steroids, DuoNeb's around-the-clock, Pulmicort nebs. -Empirically start him on Zithromax. -He will need to be assessed for home oxygen prior to discharge.  #2 acute bronchitis-continue her Zithromax.  #3 chronic anemia-hemoglobin stable. Continue iron supplements.    All the records are reviewed and case discussed  with ED provider. Management plans discussed with the patient, family and they are in agreement.  CODE STATUS: Full code  TOTAL TIME TAKING CARE OF THIS PATIENT: 45 minutes.    Henreitta Leber M.D on 01/05/2016 at 3:43 PM  Between 7am to 6pm - Pager - [phone]  After 6pm go to [email or web address] - password [email or web address] [facility]  Office   [phone]  CC: Primary care physician; Viviana Simpler, MD

## 2016-01-05 NOTE — ED Provider Notes (Signed)
Time Seen: Approximately *1110  I have reviewed the triage notes  Chief Complaint: Shortness of Breath   History of Present Illness: Danny Page is a 81 y.o. male who has a history of COPD, but has been doing well at home. Patient's currently not on any home oxygen therapy. He states he's been having shortness of breath and feeling like he's been suffocating for the last 3 days. He describes a wet sounding productive cough with yellow tinged sputum. He is not aware of any fever or chest pain. Patient denies any nausea, vomiting. She apparently called EMS earlier this morning and received a breathing treatment at home felt improved and then stated that the shortness of breath came back suddenly notified EMS once again. He was given another DuoNeb and transported here to emergency department. He again states he feels improved but still has some feelings of shortness of breath. He denies any leg pain or swelling or calf tenderness. Past Medical History  Diagnosis Date  . COPD (chronic obstructive pulmonary disease) ([place])   . Post herpetic neuralgia   . Hemorrhoids   . Anemia   . Stomach cancer ([place])     remission  . CAD (coronary artery disease) 9/14    mildly abnormal perfusion study  . BPH (benign prostatic hypertrophy)     Patient Active Problem List   Diagnosis Date Noted  . LLQ pain 11/25/2015  . Malnutrition of mild degree ([place]) 01/08/2015  . Advance directive discussed with patient 01/08/2015  . Chronic recurrent major depressive disorder ([place]) 07/15/2014  . BPH (benign prostatic hypertrophy)   . Routine general medical examination at a health care facility 01/02/2014  . CAD (coronary artery disease)   . Gastric cancer ([place]) 05/23/2012  . Vertigo 03/06/2012  . POSTHERPETIC NEURALGIA 07/22/2008  . COPD with emphysema ([place]) 12/18/2007    Past Surgical History  Procedure Laterality Date  . Stomach surgery      for ulcers, age 89  . Eus  03/30/2012    Procedure: UPPER  ENDOSCOPIC ULTRASOUND (EUS) LINEAR;  Surgeon: Milus Banister, MD;  [facility];  Service: Endoscopy;  Laterality: N/A;  . Cataract extraction w/ intraocular lens implant Right 2016    Past Surgical History  Procedure Laterality Date  . Stomach surgery      for ulcers, age 73  . Eus  03/30/2012    Procedure: UPPER ENDOSCOPIC ULTRASOUND (EUS) LINEAR;  Surgeon: Milus Banister, MD;  [facility];  Service: Endoscopy;  Laterality: N/A;  . Cataract extraction w/ intraocular lens implant Right 2016    Current Outpatient Rx  Name  Route  Sig  Dispense  Refill  . ferrous sulfate 325 (65 FE) MG tablet   Oral   Take 325 mg by mouth daily with breakfast.          . tiotropium (SPIRIVA HANDIHALER) 18 MCG inhalation capsule      INHALE ONE DOSE BY MOUTH ONCE DAILY   30 capsule   12     Allergies:  Review of patient's allergies indicates no known allergies.  Family History: Family History  Problem Relation Age of Onset  . Heart disease Brother   . Cancer Mother 52    breast ca with mets to lymph nodes    Social History: Social History  Substance Use Topics  . Smoking status: Former Smoker -- 45 years    Types: Cigarettes    Quit date: 10/26/2003  . Smokeless tobacco: Never Used  .  Alcohol Use: No     Comment: quit about 2004, heavy in the past     Review of Systems:   10 point review of systems was performed and was otherwise negative:  Constitutional: No fever Eyes: No visual disturbances ENT: No sore throat, ear pain Cardiac: No chest pain Respiratory: Shortness of breath. Abdomen: No abdominal pain, no vomiting, No diarrhea Endocrine: No weight loss, No night sweats Extremities: No peripheral edema, cyanosis Skin: No rashes, easy bruising Neurologic: No focal weakness, trouble with speech or swollowing Urologic: No dysuria, Hematuria, or urinary frequency   Physical Exam:  ED Triage Vitals  Enc Vitals Group     BP 01/05/16 1054 148/75  mmHg     Pulse Rate 01/05/16 1054 120     Resp 01/05/16 1054 20     Temp 01/05/16 1054 97.5 F (36.4 C)     Temp Source 01/05/16 1054 Oral     SpO2 01/05/16 1054 94 %     Weight 01/05/16 1054 103 lb (46.72 kg)     Height 01/05/16 1054 5\' 6"  (1.676 m)     Head Cir --      Peak Flow --      Pain Score 01/05/16 1102 0     Pain Loc --      Pain Edu? --      Excl. in [place]? --     General: Awake , Alert , and Oriented times 3; GCS 15 Head: Normal cephalic , atraumatic Eyes: Pupils equal , round, reactive to light Nose/Throat: No nasal drainage, patent upper airway without erythema or exudate.  Neck: Supple, Full range of motion, No anterior adenopathy or palpable thyroid masses Lungs: Limited air movement with auscultation without any obvious rales or rhonchi. And expiratory wheezing heard primarily anteriorly Heart: Tachycardic, regular rhythm without murmurs , gallops , or rubs Abdomen: Soft, non tender without rebound, guarding , or rigidity; bowel sounds positive and symmetric in all 4 quadrants. No organomegaly .        Extremities: 2 plus symmetric pulses. No edema, clubbing or cyanosis Neurologic: normal ambulation, Motor symmetric without deficits, sensory intact Skin: warm, dry, no rashes   Labs:   All laboratory work was reviewed including any pertinent negatives or positives listed below:  Labs Reviewed  COMPREHENSIVE METABOLIC PANEL  CBC WITH DIFFERENTIAL/PLATELET    EKG: ED ECG REPORT I, Daymon Larsen, the attending physician, personally viewed and interpreted this ECG.  Date: 01/05/2016 EKG Time: 1103 Rate: 120 Rhythm: Sinus tachycardia QRS Axis: normal Intervals: normal ST/T Wave abnormalities: Nonspecific ST-T wave abnormality Conduction Disturbances: none Narrative Interpretation: unremarkable Low-voltage QRS is indicative of pulmonary disease   Radiology: *  3 days, history COPD, gastric cancer, coronary artery disease, former  smoker  EXAM: CHEST 2 VIEW  COMPARISON: 06/20/2012  FINDINGS: Normal heart size, mediastinal contours, and pulmonary vascularity.  Emphysematous and minimal bronchitic changes consistent with COPD.  Lungs otherwise clear.  No pulmonary infiltrate, pleural effusion or pneumothorax.  Bones demineralized.  IMPRESSION: COPD changes.  No acute abnormalities.     I personally reviewed the radiologic studies     ED Course: * Patient received a DuoNeb along with steroid treatment. Patient had some symptomatic improvement and felt improved enough to be discharged. Patient went out to the lobby and started feeling short of breath again pulse ox was rechecked and was found to be at 85% and the patient was brought back to the emergency department and will be given another  breathing treatment but obviously failed any outpatient management at this time for his COPD. I don't suspect other sources for shortness of breath such as pulmonary embolism, pulmonary hypertension, pulmonary edema or community-acquired pneumonia at this time. Patient does not appear to have a pneumothorax etc.    Assessment:  Acute exacerbation of chronic obstructive pulmonary disease      Plan:  Outpatient management Patient was advised to return immediately if condition worsens. Patient was advised to follow up with their primary care physician or other specialized physicians involved in their outpatient care            Daymon Larsen, MD 01/05/16 1525

## 2016-01-05 NOTE — ED Notes (Signed)
Pt states he feels like he is "suffocating" - Pt has COPD and it has been worse the past three days - Denies consistent cough but states occasionally is coughing up thick yellow mucus - Denies feeling sick

## 2016-01-05 NOTE — ED Notes (Signed)
Pt arrived via EMS from home for complaints of SOB. EMS reports pt called at 0430, EMS arrived and gave one duoneb. Pt called EMS back. EMS administered one duoneb. EMS reports bilaterally wheezing, 130/80, 98% RA, 110 HR.

## 2016-01-05 NOTE — Telephone Encounter (Signed)
Pt has copd and a cough, ambulance came by and gave him something to open up his lungs.  Wife is asking for you to make a home visit today.he is hot and cold and spitting up yellow mucus. Oxygen level was fine per the paraqmedics wife  states that he can not come in for a visit.   Also sending to th  cb [phone]

## 2016-01-05 NOTE — Telephone Encounter (Signed)
Patient Name: Danny Page  DOB: [birth year]    Initial Comment Caller states has a bad cough, spitting up yellow mucus. Has COPD, can't breathe enough to go to bathroom, very short of breath   Nurse Assessment  Nurse: Leilani Merl, RN, Nira Conn Date/Time (Eastern Time): 01/05/2016 8:25:35 AM  Confirm and document reason for call. If symptomatic, describe symptoms. You must click the next button to save text entered. ---Caller states has a bad cough, spitting up yellow mucus. Has COPD, can't breathe enough to go to bathroom, very short of breath, the EMS came to his house this morning but he refused to go to the ED. It started on Friday but got worse yesterday, he is having SOB with any activity  Has the patient traveled out of the country within the last 30 days? ---Not Applicable  Does the patient have any new or worsening symptoms? ---Yes  Will a triage be completed? ---Yes  Related visit to physician within the last 2 weeks? ---No  Does the PT have any chronic conditions? (i.e. diabetes, asthma, etc.) ---Yes  List chronic conditions. ---See MR  Is this a behavioral health or substance abuse call? ---No     Guidelines    Guideline Title Affirmed Question Affirmed Notes  Cough - Acute Productive Difficulty breathing    Final Disposition User   Go to ED Now Stand[place], RN, [facility] - ED   Disagree/Comply: Comply

## 2016-01-05 NOTE — Progress Notes (Signed)
Patient lovenox order was changed from 40mg  daily to 30mg  daily due to pt weight. Pt is a male and body weight is <57kg. Per protocol lovenox dose was changed.  Ramond Dial, Pharm.D Clinical Pharmacist

## 2016-01-05 NOTE — Telephone Encounter (Signed)
Please call to check on pt. 

## 2016-01-05 NOTE — Telephone Encounter (Signed)
See previous encounter: I spoke to the wife and she was calling EMS to take him to the ER.

## 2016-01-05 NOTE — ED Notes (Signed)
Pt brought back into ED department for shortness of breath and pursed lip breathing - Pt requesting to be placed on oxygen - Wife not comfortable taking pt home - O2 sat 88% on RA pulse 115 - Reported off to [place] and pt placed in room #6 - Dr Marcelene Butte notified of pt condition and report given to nurse taken over care

## 2016-01-06 LAB — BASIC METABOLIC PANEL
Anion gap: 6 (ref 5–15)
BUN: 23 mg/dL — ABNORMAL HIGH (ref 6–20)
CALCIUM: 8.6 mg/dL — AB (ref 8.9–10.3)
CO2: 27 mmol/L (ref 22–32)
CREATININE: 0.85 mg/dL (ref 0.61–1.24)
Chloride: 101 mmol/L (ref 101–111)
Glucose, Bld: 164 mg/dL — ABNORMAL HIGH (ref 65–99)
Potassium: 4.8 mmol/L (ref 3.5–5.1)
SODIUM: 134 mmol/L — AB (ref 135–145)

## 2016-01-06 LAB — CBC
HCT: 33.3 % — ABNORMAL LOW (ref 40.0–52.0)
Hemoglobin: 10.9 g/dL — ABNORMAL LOW (ref 13.0–18.0)
MCH: 24.1 pg — AB (ref 26.0–34.0)
MCHC: 32.8 g/dL (ref 32.0–36.0)
MCV: 73.5 fL — ABNORMAL LOW (ref 80.0–100.0)
PLATELETS: 189 10*3/uL (ref 150–440)
RBC: 4.54 MIL/uL (ref 4.40–5.90)
RDW: 20.5 % — ABNORMAL HIGH (ref 11.5–14.5)
WBC: 5.1 10*3/uL (ref 3.8–10.6)

## 2016-01-06 NOTE — Care Management Note (Signed)
Case Management Note  Patient Details  Name: Danny Page MRN: [id number] Date of Birth: July 09, 1935  Subjective/Objective:                 Patient admitted from home with COPD exacerbation.  Patient lives at home with his wife.  Patient states that he obtains his medications from [facility] on [address].  Patient states that he has a cane in the home, no other medical equipment.  Patient states that his wife provides transportation when needed.  Patient is requiring acute O2 at this time.  RN to attempt to wean.  Will assess for home O2 need prior to discharge.    Action/Plan: RNSM following  Expected Discharge Date:                  Expected Discharge Plan:     In-House Referral:     Discharge planning Services     Post Acute Care Choice:    Choice offered to:     DME Arranged:    DME Agency:     HH Arranged:    [place]:     Status of Service:     Medicare Important Message Given:    Date Medicare IM Given:    Medicare IM give by:    Date Additional Medicare IM Given:    Additional Medicare Important Message give by:     If discussed at [place] of Stay Meetings, dates discussed:    Additional Comments:  Beverly Sessions, RN 01/06/2016, 12:03 PM

## 2016-01-06 NOTE — Progress Notes (Signed)
[facility] [place] NAME: Danny Page    MR#:  [id number]  DATE OF BIRTH:  01-Aug-1935  SUBJECTIVE:  CHIEF COMPLAINT:  Pt is feeling better while resting. Sob is better , coughing still   REVIEW OF SYSTEMS:  CONSTITUTIONAL: No fever, fatigue or weakness.  EYES: No blurred or double vision.  EARS, NOSE, AND THROAT: No tinnitus or ear pain.  RESPIRATORY: Has cough, exertional shortness of breath, no wheezing or hemoptysis.  CARDIOVASCULAR: No chest pain, orthopnea, edema.  GASTROINTESTINAL: No nausea, vomiting, diarrhea or abdominal pain.  GENITOURINARY: No dysuria, hematuria.  ENDOCRINE: No polyuria, nocturia,  HEMATOLOGY: No anemia, easy bruising or bleeding SKIN: No rash or lesion. MUSCULOSKELETAL: No joint pain or arthritis.   NEUROLOGIC: No tingling, numbness, weakness.  PSYCHIATRY: No anxiety or depression.   DRUG ALLERGIES:  No Known Allergies  VITALS:  Blood pressure 127/57, pulse 100, temperature 97.7 F (36.5 C), temperature source Oral, resp. rate 20, height 5\' 6"  (1.676 m), weight 46.72 kg (103 lb), SpO2 97 %.  PHYSICAL EXAMINATION:  GENERAL:  82 y.o.-year-old patient lying in the bed with no acute distress.  EYES: Pupils equal, round, reactive to light and accommodation. No scleral icterus. Extraocular muscles intact.  HEENT: Head atraumatic, normocephalic. Oropharynx and nasopharynx clear.  NECK:  Supple, no jugular venous distention. No thyroid enlargement, no tenderness.  LUNGS: Diminished  breath sounds bilaterally, no wheezing, rales,rhonchi or crepitation. No use of accessory muscles of respiration.  CARDIOVASCULAR: S1, S2 normal. No murmurs, rubs, or gallops.  ABDOMEN: Soft, nontender, nondistended. Bowel sounds present. No organomegaly or mass.  EXTREMITIES: No pedal edema, cyanosis, or clubbing.  NEUROLOGIC: Cranial nerves II through XII are intact. Muscle strength 5/5 in all extremities. Sensation intact.  Gait not checked.  PSYCHIATRIC: The patient is alert and oriented x 3.  SKIN: No obvious rash, lesion, or ulcer.    LABORATORY PANEL:   CBC  Recent Labs Lab 01/06/16 0339  WBC 5.1  HGB 10.9*  HCT 33.3*  PLT 189   ------------------------------------------------------------------------------------------------------------------  Chemistries   Recent Labs Lab 01/05/16 1149 01/06/16 0339  NA 132* 134*  K 4.2 4.8  CL 94* 101  CO2 26 27  GLUCOSE 150* 164*  BUN 16 23*  CREATININE 1.08 0.85  CALCIUM 9.2 8.6*  AST 33  --   ALT 12*  --   ALKPHOS 119  --   BILITOT 0.6  --    ------------------------------------------------------------------------------------------------------------------  Cardiac Enzymes No results for input(s): TROPONINI in the last 168 hours. ------------------------------------------------------------------------------------------------------------------  RADIOLOGY:  Dg Chest 2 View  01/05/2016  CLINICAL DATA:  Shortness of breath, cough, and chest congestion for 3 days, history COPD, gastric cancer, coronary artery disease, former smoker EXAM: CHEST  2 VIEW COMPARISON:  06/20/2012 FINDINGS: Normal heart size, mediastinal contours, and pulmonary vascularity. Emphysematous and minimal bronchitic changes consistent with COPD. Lungs otherwise clear. No pulmonary infiltrate, pleural effusion or pneumothorax. Bones demineralized. IMPRESSION: COPD changes. No acute abnormalities. Electronically Signed   By: Lavonia Dana M.D.   On: 01/05/2016 12:44    EKG:   Orders placed or performed during the hospital encounter of 01/05/16  . EKG 12-Lead  . EKG 12-Lead    ASSESSMENT AND PLAN:   81 year old male with past medical history of COPD, stomach cancer, coronary artery disease, chronic anemia who presents to the hospital due to shortness of breath and cough and noted to be in COPD exacerbation.  #1 COPD exacerbation-due to  underlying acute bronchitis.  -  clinically better -on IV steroids, DuoNeb's around-the-clock, Pulmicort nebs. -Empirically on Zithromax. -He will need to be assessed for home oxygen prior to discharge.  #2 acute bronchitis-continue her Zithromax.  #3 chronic anemia-hemoglobin stable. Continue iron supplements     All the records are reviewed and case discussed with Care Management/Social Workerr. Management plans discussed with the patient, family and they are in agreement.  CODE STATUS: fc   TOTAL TIME TAKING CARE OF THIS PATIENT: 35  minutes.   POSSIBLE D/C IN 1-2  DAYS, DEPENDING ON CLINICAL CONDITION.   [name] M.D on 01/06/2016 at 2:26 PM  Between 7am to 6pm - Pager - [phone] After 6pm go to [email or web address] - password [email or web address] [facility]  Office  [phone]  CC: Primary care physician; Viviana Simpler, MD

## 2016-01-07 LAB — BASIC METABOLIC PANEL
Anion gap: 5 (ref 5–15)
BUN: 26 mg/dL — AB (ref 6–20)
CALCIUM: 8.2 mg/dL — AB (ref 8.9–10.3)
CO2: 28 mmol/L (ref 22–32)
Chloride: 100 mmol/L — ABNORMAL LOW (ref 101–111)
Creatinine, Ser: 0.87 mg/dL (ref 0.61–1.24)
GFR calc Af Amer: >60 mL/min (ref >60)
GLUCOSE: 144 mg/dL — AB (ref 65–99)
POTASSIUM: 4.5 mmol/L (ref 3.5–5.1)
Sodium: 133 mmol/L — ABNORMAL LOW (ref 135–145)

## 2016-01-07 LAB — CBC
HCT: 32.5 % — ABNORMAL LOW (ref 40.0–52.0)
Hemoglobin: 10.4 g/dL — ABNORMAL LOW (ref 13.0–18.0)
MCH: 24.1 pg — AB (ref 26.0–34.0)
MCHC: 32 g/dL (ref 32.0–36.0)
MCV: 75.3 fL — AB (ref 80.0–100.0)
PLATELETS: 206 10*3/uL (ref 150–440)
RBC: 4.32 MIL/uL — ABNORMAL LOW (ref 4.40–5.90)
RDW: 20.9 % — AB (ref 11.5–14.5)
WBC: 13.2 10*3/uL — ABNORMAL HIGH (ref 3.8–10.6)

## 2016-01-07 MED ORDER — TIOTROPIUM BROMIDE MONOHYDRATE 18 MCG IN CAPS
18.0000 ug | ORAL_CAPSULE | Freq: Every day | RESPIRATORY_TRACT | Status: DC
  Administered 2016-01-07 – 2016-01-08 (×2): 18 ug via RESPIRATORY_TRACT
  Filled 2016-01-07 (×2): qty 5

## 2016-01-07 MED ORDER — ALBUTEROL SULFATE (2.5 MG/3ML) 0.083% IN NEBU
2.5000 mg | INHALATION_SOLUTION | RESPIRATORY_TRACT | Status: DC
  Administered 2016-01-07 – 2016-01-08 (×9): 2.5 mg via RESPIRATORY_TRACT
  Filled 2016-01-07 (×9): qty 3

## 2016-01-07 MED ORDER — PREDNISONE 50 MG PO TABS
50.0000 mg | ORAL_TABLET | Freq: Every day | ORAL | Status: DC
  Administered 2016-01-08: 50 mg via ORAL
  Filled 2016-01-07: qty 1

## 2016-01-07 NOTE — Care Management Important Message (Signed)
Important Message  Patient Details  Name: Danny Page MRN: [id number] Date of Birth: 1935/08/16   Medicare Important Message Given:  Yes    Juliann Pulse A Jelani Vreeland 01/07/2016, 10:03 AM

## 2016-01-07 NOTE — Telephone Encounter (Signed)
Was admitted Will follow up on his progress in hospital

## 2016-01-07 NOTE — Progress Notes (Signed)
[facility] [place] NAME: Danny Page    MR#:  [id number]  DATE OF BIRTH:  1934/11/11  SUBJECTIVE:  CHIEF COMPLAINT:  Pt is feeling better while resting. Sob is better , cough is improving   REVIEW OF SYSTEMS:  CONSTITUTIONAL: No fever, fatigue or weakness.  EYES: No blurred or double vision.  EARS, NOSE, AND THROAT: No tinnitus or ear pain.  RESPIRATORY: Has cough, exertional shortness of breath, no wheezing or hemoptysis.  CARDIOVASCULAR: No chest pain, orthopnea, edema.  GASTROINTESTINAL: No nausea, vomiting, diarrhea or abdominal pain.  GENITOURINARY: No dysuria, hematuria.  ENDOCRINE: No polyuria, nocturia,  HEMATOLOGY: No anemia, easy bruising or bleeding SKIN: No rash or lesion. MUSCULOSKELETAL: No joint pain or arthritis.   NEUROLOGIC: No tingling, numbness, weakness.  PSYCHIATRY: No anxiety or depression.   DRUG ALLERGIES:  No Known Allergies  VITALS:  Blood pressure 126/55, pulse 109, temperature 97.6 F (36.4 C), temperature source Oral, resp. rate 20, height 5\' 6"  (1.676 m), weight 46.72 kg (103 lb), SpO2 97 %.  PHYSICAL EXAMINATION:  GENERAL:  81 y.o.-year-old patient lying in the bed with no acute distress.  EYES: Pupils equal, round, reactive to light and accommodation. No scleral icterus. Extraocular muscles intact.  HEENT: Head atraumatic, normocephalic. Oropharynx and nasopharynx clear.  NECK:  Supple, no jugular venous distention. No thyroid enlargement, no tenderness.  LUNGS:   moderate breath sounds  bilaterally, no wheezing, rales,rhonchi or crepitation. No use of accessory muscles of respiration.  CARDIOVASCULAR: S1, S2 normal. No murmurs, rubs, or gallops.  ABDOMEN: Soft, nontender, nondistended. Bowel sounds present. No organomegaly or mass.  EXTREMITIES: No pedal edema, cyanosis, or clubbing.  NEUROLOGIC: Cranial nerves II through XII are intact. Muscle strength 5/5 in all extremities. Sensation  intact. Gait not checked.  PSYCHIATRIC: The patient is alert and oriented x 3.  SKIN: No obvious rash, lesion, or ulcer.    LABORATORY PANEL:   CBC  Recent Labs Lab 01/07/16 0437  WBC 13.2*  HGB 10.4*  HCT 32.5*  PLT 206   ------------------------------------------------------------------------------------------------------------------  Chemistries   Recent Labs Lab 01/05/16 1149  01/07/16 0437  NA 132*  < > 133*  K 4.2  < > 4.5  CL 94*  < > 100*  CO2 26  < > 28  GLUCOSE 150*  < > 144*  BUN 16  < > 26*  CREATININE 1.08  < > 0.87  CALCIUM 9.2  < > 8.2*  AST 33  --   --   ALT 12*  --   --   ALKPHOS 119  --   --   BILITOT 0.6  --   --   < > = values in this interval not displayed. ------------------------------------------------------------------------------------------------------------------  Cardiac Enzymes No results for input(s): TROPONINI in the last 168 hours. ------------------------------------------------------------------------------------------------------------------  RADIOLOGY:  No results found.  EKG:   Orders placed or performed during the hospital encounter of 01/05/16  . EKG 12-Lead  . EKG 12-Lead    ASSESSMENT AND PLAN:   81 year old male with past medical history of COPD, stomach cancer, coronary artery disease, chronic anemia who presents to the hospital due to shortness of breath and cough and noted to be in COPD exacerbation.  #1 COPD exacerbation-due to underlying acute bronchitis.  - clinically better -Tapered  IV steroids, DuoNeb's around-the-clock, Pulmicort nebs. -Empirically on Zithromax. -He will need to be assessed for home oxygen prior to discharge. will check   ambulatorypulse ox   #  2 acute bronchitis-continue her Zithromax.  #3 chronic anemia-hemoglobin stable. Continue iron supplements     All the records are reviewed and case discussed with Care Management/Social Workerr. Management plans discussed with the  patient, family and they are in agreement.  CODE STATUS: fc   TOTAL TIME TAKING CARE OF THIS PATIENT: 35  minutes.   POSSIBLE D/C IN a.m.   DAYS, DEPENDING ON CLINICAL CONDITION.   [name] M.D on 01/07/2016 at 3:25 PM  Between 7am to 6pm - Pager - [phone] After 6pm go to [email or web address] - password [email or web address] [facility]  Office  [phone]  CC: Primary care physician; Viviana Simpler, MD

## 2016-01-07 NOTE — Progress Notes (Signed)
Initial Nutrition Assessment  DOCUMENTATION CODES:   Severe malnutrition in context of chronic illness  INTERVENTION:  Meals and snacks: Cater to pt preferences, discussed small frequent meals as option for pt. Does not want snacks at this time Medical Nutrition Supplement Therapy: Does not like supplements and does not want them    NUTRITION DIAGNOSIS:   Predicted suboptimal nutrient intake related to chronic illness as evidenced by moderate depletion of body fat, severe depletion of body fat, severe depletion of muscle mass, moderate depletions of muscle mass (low BMI).    GOAL:   Patient will meet greater than or equal to 90% of their needs    MONITOR:   PO intake  REASON FOR ASSESSMENT:    (low BMI)    ASSESSMENT:      Pt admitted with COPD exacerbation secondary to acute bronchitis, shortness of breath  Past Medical History  Diagnosis Date  . COPD (chronic obstructive pulmonary disease) ([place])   . Post herpetic neuralgia   . Hemorrhoids   . Anemia   . Stomach cancer ([place])     remission  . CAD (coronary artery disease) 9/14    mildly abnormal perfusion study  . BPH (benign prostatic hypertrophy)     Current Nutrition: full lunch tray sitting at bedside, reports ate breakfast (pancakes and eggs)  Food/Nutrition-Related History: pt reports poor poi   Scheduled Medications:  . albuterol  2.5 mg Nebulization Q4H  . azithromycin  250 mg Oral Daily  . budesonide (PULMICORT) nebulizer solution  0.25 mg Nebulization BID  . enoxaparin (LOVENOX) injection  30 mg Subcutaneous Q24H  . ferrous sulfate  650 mg Oral Q breakfast  . guaiFENesin  600 mg Oral BID  . methylPREDNISolone (SOLU-MEDROL) injection  40 mg Intravenous Q12H  . tiotropium  18 mcg Inhalation Daily        Electrolyte/Renal Profile and Glucose Profile:   Recent Labs Lab 01/05/16 1149 01/06/16 0339 01/07/16 0437  NA 132* 134* 133*  K 4.2 4.8 4.5  CL 94* 101 100*  CO2 26 27 28   BUN  16 23* 26*  CREATININE 1.08 0.85 0.87  CALCIUM 9.2 8.6* 8.2*  GLUCOSE 150* 164* 144*   Protein Profile:   Recent Labs Lab 01/05/16 1149  ALBUMIN 4.2    Gastrointestinal Profile: Last BM:3/13   Nutrition-Focused physical exam completed. Findings are severe-moderate  fat depletion, moderate-severe muscle depletion, and no edema.    Weight Change: 6% wt loss in the last 4 months    Diet Order:  Diet regular Room service appropriate?: Yes; Fluid consistency:: Thin  Skin:   reviewed   Height:   Ht Readings from Last 1 Encounters:  01/05/16 5\' 6"  (1.676 m)    Weight:   Wt Readings from Last 1 Encounters:  01/05/16 103 lb (46.72 kg)    Ideal Body Weight:     BMI:  Body mass index is 16.63 kg/(m^2).  Estimated Nutritional Needs:   Kcal:  BEE 1312 kcals (IF 1.0-1.3, AF 1.3)1705-2217 kcals/d.   Protein:  (1.0-1.2 g/kg) 65-78 g/d  Fluid:  (30-43ml/kg) 1950-2246ml/d  EDUCATION NEEDS:   No education needs identified at this time  [place]. Zenia Resides, [place], [place] (pager) Weekend/On-Call pager [phone])

## 2016-01-07 NOTE — Progress Notes (Signed)
01/07/2016 12:00  Pt was ambulated 30 ft. with NT Krysta on 3L 02.  Sp02 read 93, however pt began to feel weak and like his legs were buckling.  He felt short of breath and required [place] to bring his recliner so that he could be seated and roleld back to his room.  Respiratory therapist provided a Duoneb breathing treatment upon return to his room and SOB subsided.  Dola Argyle, RN

## 2016-01-08 DIAGNOSIS — E43 Unspecified severe protein-calorie malnutrition: Secondary | ICD-10-CM | POA: Insufficient documentation

## 2016-01-08 MED ORDER — ACETAMINOPHEN 325 MG PO TABS: 650.0000 mg | ORAL_TABLET | Freq: Four times a day (QID) | ORAL | Status: DC | PRN

## 2016-01-08 MED ORDER — PREDNISONE 10 MG (21) PO TBPK
10.0000 mg | ORAL_TABLET | Freq: Every day | ORAL | Status: DC
Start: 2016-01-08 — End: 2016-01-15

## 2016-01-08 MED ORDER — BUDESONIDE 0.25 MG/2ML IN SUSP: 0.2500 mg | Freq: Two times a day (BID) | RESPIRATORY_TRACT | Status: DC

## 2016-01-08 MED ORDER — ALBUTEROL SULFATE HFA 108 (90 BASE) MCG/ACT IN AERS: 2.0000 | INHALATION_SPRAY | Freq: Four times a day (QID) | RESPIRATORY_TRACT | Status: DC | PRN

## 2016-01-08 MED ORDER — AZITHROMYCIN 250 MG PO TABS: 250.0000 mg | ORAL_TABLET | Freq: Every day | ORAL | Status: AC

## 2016-01-08 MED ORDER — GUAIFENESIN-DM 100-10 MG/5ML PO SYRP: 5.0000 mL | ORAL_SOLUTION | ORAL | Status: DC | PRN

## 2016-01-08 NOTE — Care Management (Signed)
Patient to be discharge home today.  Qualifying O2 sats and home O2 order placed.  Will from [facility] notified, an dportable tank delivered to room.  Home health PT and RN ordered.  Patient given agency choice.  Patient states that he does not have a preference.  Referral made to [facility].  Corene Cornea with [facility] notified.

## 2016-01-08 NOTE — Progress Notes (Addendum)
Patient discharged to home with home oxygen. Oxygen delivered to room for home use. Patient instructed on how to turn on tank. Prescription given to patient wife. IV discontinued, and IV site clean. Patient remains on 2 liters per nasal cannula, ambulates with assistance. No acute distress.

## 2016-01-08 NOTE — Discharge Instructions (Signed)
Acute Bronchitis Bronchitis is inflammation of the airways that extend from the windpipe into the lungs (bronchi). The inflammation often causes mucus to develop. This leads to a cough, which is the most common symptom of bronchitis.  In acute bronchitis, the condition usually develops suddenly and goes away over time, usually in a couple weeks. Smoking, allergies, and asthma can make bronchitis worse. Repeated episodes of bronchitis may cause further lung problems.  CAUSES Acute bronchitis is most often caused by the same virus that causes a cold. The virus can spread from person to person (contagious) through coughing, sneezing, and touching contaminated objects. SIGNS AND SYMPTOMS   Cough.   Fever.   Coughing up mucus.   Body aches.   Chest congestion.   Chills.   Shortness of breath.   Sore throat.  DIAGNOSIS  Acute bronchitis is usually diagnosed through a physical exam. Your health care provider will also ask you questions about your medical history. Tests, such as chest X-rays, are sometimes done to rule out other conditions.  TREATMENT  Acute bronchitis usually goes away in a couple weeks. Oftentimes, no medical treatment is necessary. Medicines are sometimes given for relief of fever or cough. Antibiotic medicines are usually not needed but may be prescribed in certain situations. In some cases, an inhaler may be recommended to help reduce shortness of breath and control the cough. A cool mist vaporizer may also be used to help thin bronchial secretions and make it easier to clear the chest.  HOME CARE INSTRUCTIONS  Get plenty of rest.   Drink enough fluids to keep your urine clear or pale yellow (unless you have a medical condition that requires fluid restriction). Increasing fluids may help thin your respiratory secretions (sputum) and reduce chest congestion, and it will prevent dehydration.   Take medicines only as directed by your health care provider.  If  you were prescribed an antibiotic medicine, finish it all even if you start to feel better.  Avoid smoking and secondhand smoke. Exposure to cigarette smoke or irritating chemicals will make bronchitis worse. If you are a smoker, consider using nicotine gum or skin patches to help control withdrawal symptoms. Quitting smoking will help your lungs heal faster.   Reduce the chances of another bout of acute bronchitis by washing your hands frequently, avoiding people with cold symptoms, and trying not to touch your hands to your mouth, nose, or eyes.   Keep all follow-up visits as directed by your health care provider.  SEEK MEDICAL CARE IF: Your symptoms do not improve after 1 week of treatment.  SEEK IMMEDIATE MEDICAL CARE IF:  You develop an increased fever or chills.   You have chest pain.   You have severe shortness of breath.  You have bloody sputum.   You develop dehydration.  You faint or repeatedly feel like you are going to pass out.  You develop repeated vomiting.  You develop a severe headache. MAKE SURE YOU:   Understand these instructions.  Will watch your condition.  Will get help right away if you are not doing well or get worse.   This information is not intended to replace advice given to you by your health care provider. Make sure you discuss any questions you have with your health care provider.   Document Released: 11/18/2004 Document Revised: 11/01/2014 Document Reviewed: 04/03/2013 Elsevier Interactive Patient Education [employer].  Please return immediately if condition worsens. Please contact her primary physician or the physician you were given for  referral. If you have any specialist physicians involved in her treatment and plan please also contact them. Thank you for using [facility].   Activity as tolerated with 2 L of oxygen via nasal cannula Diet regular Follow-up with primary care physician as scheduled  on 01/12/16

## 2016-01-08 NOTE — Discharge Summary (Signed)
[facility] at [place] NAME: Danny Page    MR#:  [id number]  DATE OF BIRTH:  1935/02/08  DATE OF ADMISSION:  01/05/2016 ADMITTING PHYSICIAN: Henreitta Leber, MD  DATE OF DISCHARGE: 01/08/16 PRIMARY CARE PHYSICIAN: Viviana Simpler, MD    ADMISSION DIAGNOSIS:  Acute bronchitis, unspecified organism [J20.9]  DISCHARGE DIAGNOSIS:  Active Problems:   COPD exacerbation (H[facility])   Protein-calorie malnutrition, severe   SECONDARY DIAGNOSIS:   Past Medical History  Diagnosis Date  . COPD (chronic obstructive pulmonary disease) ([place])   . Post herpetic neuralgia   . Hemorrhoids   . Anemia   . Stomach cancer ([place])     remission  . CAD (coronary artery disease) 9/14    mildly abnormal perfusion study  . BPH (benign prostatic hypertrophy)     HOSPITAL COURSE:   #1 COPD exacerbation-due to underlying acute bronchitis.  - clinically better -Tapered IV steroids, DuoNeb's around-the-clock, Pulmicort nebs. -Empirically on Zithromax. -Patient family reports as 87% on room air and 91% on 2 L, will discharge him home with 2 L of oxygen via nasal cannula -Outpatient follow-up with primary care physician -Discharging home with prednisone tapering dose and azithromycin. Continue inhalers as prescribed   #2 acute bronchitis-continue her Zithromax.  #3 chronic anemia-hemoglobin stable. Continue iron supplements  #4 severe protein energy malnutrition-continue dietary recommendation implementation Regular diet, PCP to follow up and do further workup if needed  DISCHARGE CONDITIONS:  fair  CONSULTS OBTAINED:      PROCEDURES none  DRUG ALLERGIES:  No Known Allergies  DISCHARGE MEDICATIONS:   Current Discharge Medication List    START taking these medications   Details  acetaminophen (TYLENOL) 325 MG tablet Take 2 tablets (650 mg total) by mouth every 6 (six) hours as needed for mild pain (or Fever >/= 101).    albuterol  (PROVENTIL HFA;VENTOLIN HFA) 108 (90 Base) MCG/ACT inhaler Inhale 2 puffs into the lungs every 6 (six) hours as needed for wheezing or shortness of breath. Qty: 1 Inhaler, Refills: 1    azithromycin (ZITHROMAX) 250 MG tablet Take 1 tablet (250 mg total) by mouth daily. 250 Milligrams by mouth once daily for 2 days Qty: 6 each, Refills: 0    budesonide (PULMICORT) 0.25 MG/2ML nebulizer solution Take 2 mLs (0.25 mg total) by nebulization 2 (two) times daily. Qty: 60 mL, Refills: 0    guaiFENesin-dextromethorphan (ROBITUSSIN DM) 100-10 MG/5ML syrup Take 5 mLs by mouth every 4 (four) hours as needed for cough. Qty: 118 mL, Refills: 0    predniSONE (STERAPRED UNI-PAK 21 TAB) 10 MG (21) TBPK tablet Take 1 tablet (10 mg total) by mouth daily. Take 6 tablets by mouth for 1 day followed by  5 tablets by mouth for 1 day followed by  4 tablets by mouth for 1 day followed by  3 tablets by mouth for 1 day followed by  2 tablets by mouth for 1 day followed by  1 tablet by mouth for a day and stop Qty: 21 tablet, Refills: 0      CONTINUE these medications which have NOT CHANGED   Details  ferrous sulfate 325 (65 FE) MG tablet Take 650 mg by mouth daily with breakfast.     tiotropium (SPIRIVA) 18 MCG inhalation capsule Place 18 mcg into inhaler and inhale daily.         DISCHARGE INSTRUCTIONS:   Activity as tolerated with 2 L of oxygen via nasal cannula Diet regular Follow-up  with primary care physician as scheduled on 01/12/16   DIET:  Regular diet  DISCHARGE CONDITION:  Fair  ACTIVITY:  Activity as tolerated  OXYGEN:  Home Oxygen: Yes.     Oxygen Delivery: 2 liters/min via Patient connected to nasal cannula oxygen  DISCHARGE LOCATION:  home   If you experience worsening of your admission symptoms, develop shortness of breath, life threatening emergency, suicidal or homicidal thoughts you must seek medical attention immediately by calling 911 or calling your MD immediately  if  symptoms less severe.  You Must read complete instructions/literature along with all the possible adverse reactions/side effects for all the Medicines you take and that have been prescribed to you. Take any new Medicines after you have completely understood and accpet all the possible adverse reactions/side effects.   Please note  You were cared for by a hospitalist during your hospital stay. If you have any questions about your discharge medications or the care you received while you were in the hospital after you are discharged, you can call the unit and asked to speak with the hospitalist on call if the hospitalist that took care of you is not available. Once you are discharged, your primary care physician will handle any further medical issues. Please note that NO REFILLS for any discharge medications will be authorized once you are discharged, as it is imperative that you return to your primary care physician (or establish a relationship with a primary care physician if you do not have one) for your aftercare needs so that they can reassess your need for medications and monitor your lab values.     Today  Chief Complaint  Patient presents with  . Shortness of Breath   Patient is feeling better. Denies any shortness of breath cough is improving  ROS:  CONSTITUTIONAL: Denies fevers, chills. Denies any fatigue, weakness.  EYES: Denies blurry vision, double vision, eye pain. EARS, NOSE, THROAT: Denies tinnitus, ear pain, hearing loss. RESPIRATORY: Improving cough, denies wheeze, shortness of breath.  CARDIOVASCULAR: Denies chest pain, palpitations, edema.  GASTROINTESTINAL: Denies nausea, vomiting, diarrhea, abdominal pain. Denies bright red blood per rectum. GENITOURINARY: Denies dysuria, hematuria. ENDOCRINE: Denies nocturia or thyroid problems. HEMATOLOGIC AND LYMPHATIC: Denies easy bruising or bleeding. SKIN: Denies rash or lesion. MUSCULOSKELETAL: Denies pain in neck, back,  shoulder, knees, hips or arthritic symptoms.  NEUROLOGIC: Denies paralysis, paresthesias.  PSYCHIATRIC: Denies anxiety or depressive symptoms.   VITAL SIGNS:  Blood pressure 112/56, pulse 97, temperature 97.7 F (36.5 C), temperature source Oral, resp. rate 18, height 5\' 6"  (1.676 m), weight 46.72 kg (103 lb), SpO2 94 %.  I/O:    Intake/Output Summary (Last 24 hours) at 01/08/16 1141 Last data filed at 01/08/16 0900  Gross per 24 hour  Intake    480 ml  Output    175 ml  Net    305 ml    PHYSICAL EXAMINATION:  GENERAL:  81 y.o.-year-old patient lying in the bed with no acute distress.  EYES: Pupils equal, round, reactive to light and accommodation. No scleral icterus. Extraocular muscles intact.  HEENT: Head atraumatic, normocephalic. Oropharynx and nasopharynx clear.  NECK:  Supple, no jugular venous distention. No thyroid enlargement, no tenderness.  LUNGS: Normal breath sounds bilaterally, no wheezing, rales,rhonchi or crepitation. No use of accessory muscles of respiration.  CARDIOVASCULAR: S1, S2 normal. No murmurs, rubs, or gallops.  ABDOMEN: Soft, non-tender, non-distended. Bowel sounds present. No organomegaly or mass.  EXTREMITIES: No pedal edema, cyanosis, or clubbing.  NEUROLOGIC: Cranial  nerves II through XII are intact. Muscle strength 5/5 in all extremities. Sensation intact. Gait not checked.  PSYCHIATRIC: The patient is alert and oriented x 3.  SKIN: No obvious rash, lesion, or ulcer.   DATA REVIEW:   CBC  Recent Labs Lab 01/07/16 0437  WBC 13.2*  HGB 10.4*  HCT 32.5*  PLT 206    Chemistries   Recent Labs Lab 01/05/16 1149  01/07/16 0437  NA 132*  < > 133*  K 4.2  < > 4.5  CL 94*  < > 100*  CO2 26  < > 28  GLUCOSE 150*  < > 144*  BUN 16  < > 26*  CREATININE 1.08  < > 0.87  CALCIUM 9.2  < > 8.2*  AST 33  --   --   ALT 12*  --   --   ALKPHOS 119  --   --   BILITOT 0.6  --   --   < > = values in this interval not displayed.  Cardiac  Enzymes No results for input(s): TROPONINI in the last 168 hours.  Microbiology Results  No results found for this or any previous visit.  RADIOLOGY:  Dg Chest 2 View  01/05/2016  CLINICAL DATA:  Shortness of breath, cough, and chest congestion for 3 days, history COPD, gastric cancer, coronary artery disease, former smoker EXAM: CHEST  2 VIEW COMPARISON:  06/20/2012 FINDINGS: Normal heart size, mediastinal contours, and pulmonary vascularity. Emphysematous and minimal bronchitic changes consistent with COPD. Lungs otherwise clear. No pulmonary infiltrate, pleural effusion or pneumothorax. Bones demineralized. IMPRESSION: COPD changes. No acute abnormalities. Electronically Signed   By: Lavonia Dana M.D.   On: 01/05/2016 12:44    EKG:   Orders placed or performed during the hospital encounter of 01/05/16  . EKG 12-Lead  . EKG 12-Lead      Management plans discussed with the patient, family and they are in agreement.  CODE STATUS:     Code Status Orders        Start     Ordered   01/05/16 1739  Full code   Continuous     01/05/16 1738    Code Status History    Date Active Date Inactive Code Status Order ID Comments User Context   03/17/2012 11:44 AM 03/17/2012 11:45 AM Full Code [id number]  Willia Craze, NP Inpatient   03/17/2012 11:38 AM 03/17/2012 11:44 AM Full Code [id number]  Willia Craze, NP Inpatient    Advance Directive Documentation        Most Recent Value   Type of Advance Directive  Healthcare Power of Attorney, Living will   Pre-existing out of facility DNR order (yellow form or pink MOST form)     "MOST" Form in Place?        TOTAL TIME TAKING CARE OF THIS PATIENT: 45 minutes.    @[facility] @  on 01/08/2016 at 11:41 AM  Between 7am to 6pm - Pager - [phone]  After 6pm go to [email or web address] - password [email or web address] [facility]  Office  [phone]  CC: Primary care physician; Viviana Simpler, MD

## 2016-01-08 NOTE — Progress Notes (Signed)
SATURATION QUALIFICATIONS: (This note is used to comply with regulatory documentation for home oxygen)  Patient Saturations on Room Air at Rest =91%  Patient Saturations on Room Air while Ambulating = 87%  Patient Saturations on 2 Liters of oxygen while Ambulating = 91%  Please briefly explain why patient needs home oxygen:Extremely short of breath while ambulating

## 2016-01-09 ENCOUNTER — Telehealth: Payer: Self-pay

## 2016-01-09 ENCOUNTER — Encounter: Payer: Medicare Other | Admitting: Internal Medicine

## 2016-01-09 DIAGNOSIS — J449 Chronic obstructive pulmonary disease, unspecified: Secondary | ICD-10-CM | POA: Diagnosis not present

## 2016-01-09 NOTE — Telephone Encounter (Signed)
Transition Care Management Follow-up Telephone Call     Date discharged? 01/08/2016        How have you been since you were released from the hospital? Generalized weakness   Any patient concerns? None   Do you understand why you were in the hospital? Yes   Do you understand the discharge instructions? Yes   Where were you discharged to? Home   Items Reviewed:  Medications reviewed: No, wife manages medications  Allergies reviewed: No, wife manages medications  Dietary changes reviewed: No, wife manages dietary choices  Referrals reviewed: No   Functional Questionnaire:  Independent - I Dependent - D    Activities of Daily Living (ADLs):    Personal hygiene - D Dressing - D Eating  - I Maintaining continence - I Transferring - D  Independent Activities of Daily Living (ADLs): Basic communication skills - I Transportation - D Meal preparation - D  Shopping - D Housework - D  Managing medications - D Managing personal finances - D   Confirmed importance and date/time of follow-up visits scheduled YES  Provider Appointment booked with PCP - yes  Confirmed with patient if condition begins to worsen call PCP or go to the ER.  Patient was given the office number and encouraged to call back with question or concerns: YES

## 2016-01-12 ENCOUNTER — Ambulatory Visit: Payer: Medicare Other | Admitting: Internal Medicine

## 2016-01-12 DIAGNOSIS — J441 Chronic obstructive pulmonary disease with (acute) exacerbation: Secondary | ICD-10-CM | POA: Diagnosis not present

## 2016-01-12 DIAGNOSIS — I2510 Atherosclerotic heart disease of native coronary artery without angina pectoris: Secondary | ICD-10-CM | POA: Diagnosis not present

## 2016-01-12 DIAGNOSIS — E43 Unspecified severe protein-calorie malnutrition: Secondary | ICD-10-CM | POA: Diagnosis not present

## 2016-01-12 DIAGNOSIS — D649 Anemia, unspecified: Secondary | ICD-10-CM | POA: Diagnosis not present

## 2016-01-12 DIAGNOSIS — N400 Enlarged prostate without lower urinary tract symptoms: Secondary | ICD-10-CM | POA: Diagnosis not present

## 2016-01-12 DIAGNOSIS — Z85028 Personal history of other malignant neoplasm of stomach: Secondary | ICD-10-CM | POA: Diagnosis not present

## 2016-01-13 ENCOUNTER — Encounter: Payer: Medicare Other | Admitting: Internal Medicine

## 2016-01-13 ENCOUNTER — Telehealth: Payer: Self-pay

## 2016-01-13 DIAGNOSIS — J441 Chronic obstructive pulmonary disease with (acute) exacerbation: Secondary | ICD-10-CM | POA: Diagnosis not present

## 2016-01-13 DIAGNOSIS — D649 Anemia, unspecified: Secondary | ICD-10-CM | POA: Diagnosis not present

## 2016-01-13 DIAGNOSIS — I2510 Atherosclerotic heart disease of native coronary artery without angina pectoris: Secondary | ICD-10-CM | POA: Diagnosis not present

## 2016-01-13 DIAGNOSIS — E43 Unspecified severe protein-calorie malnutrition: Secondary | ICD-10-CM | POA: Diagnosis not present

## 2016-01-13 DIAGNOSIS — N400 Enlarged prostate without lower urinary tract symptoms: Secondary | ICD-10-CM | POA: Diagnosis not present

## 2016-01-13 DIAGNOSIS — Z85028 Personal history of other malignant neoplasm of stomach: Secondary | ICD-10-CM | POA: Diagnosis not present

## 2016-01-13 NOTE — Telephone Encounter (Signed)
Danny Page Pt with [facility] left v/m; oxygen sats normal 98% with post ambulation or at rest; heart rate goes up 110 beats/ minute and goes to 120 beats per min when pt walks; pt feels SOB but no decrease in oxygen saturation.Pt has home visit with Dr [name] on 01/15/16 and Michael Boston wants to know if Dr [name] could see pt on 01/14/16. Danny Page request cb.

## 2016-01-13 NOTE — Telephone Encounter (Signed)
No I can't get there till Thursday

## 2016-01-14 NOTE — Telephone Encounter (Signed)
Spoke to [place] at [facility]. She just wanted [place] to know. She asked if he needed an appointment. I advised her he had been scheduled earlier this week and it was cancelled

## 2016-01-15 ENCOUNTER — Other Ambulatory Visit: Payer: Self-pay

## 2016-01-15 ENCOUNTER — Ambulatory Visit (INDEPENDENT_AMBULATORY_CARE_PROVIDER_SITE_OTHER): Payer: Medicare Other | Admitting: Internal Medicine

## 2016-01-15 ENCOUNTER — Encounter: Payer: Self-pay | Admitting: Internal Medicine

## 2016-01-15 VITALS — BP 126/58 | HR 96 | Resp 16

## 2016-01-15 DIAGNOSIS — E43 Unspecified severe protein-calorie malnutrition: Secondary | ICD-10-CM

## 2016-01-15 DIAGNOSIS — J9601 Acute respiratory failure with hypoxia: Secondary | ICD-10-CM | POA: Diagnosis not present

## 2016-01-15 DIAGNOSIS — J441 Chronic obstructive pulmonary disease with (acute) exacerbation: Secondary | ICD-10-CM | POA: Diagnosis not present

## 2016-01-15 DIAGNOSIS — J9611 Chronic respiratory failure with hypoxia: Secondary | ICD-10-CM | POA: Insufficient documentation

## 2016-01-15 DIAGNOSIS — F339 Major depressive disorder, recurrent, unspecified: Secondary | ICD-10-CM | POA: Diagnosis not present

## 2016-01-15 MED ORDER — ALBUTEROL SULFATE 1.25 MG/3ML IN NEBU: 1.0000 | INHALATION_SOLUTION | Freq: Two times a day (BID) | RESPIRATORY_TRACT | Status: DC

## 2016-01-15 NOTE — Telephone Encounter (Signed)
Faxed albuterol nebulizer solution to [facility] [phone] per Melissa at [phone].

## 2016-01-15 NOTE — Assessment & Plan Note (Signed)
I am convinced that he has chronic hypoxia--but it has not been documented He needs ongoing oxygen for now--might consider just at night and prn as time goes by Should get portable unit to allow him to go out On 2l/min right now

## 2016-01-15 NOTE — Assessment & Plan Note (Signed)
Better but still not well Marked dyspnea with any activity Doesn't really seem tight so okay to be done with prednisone Continue the budesonide Will add albuterol to this nebulized--and prn Asked him to restart spiriva

## 2016-01-15 NOTE — Assessment & Plan Note (Signed)
Still not eating well His wife tries to get him to eat Won't take supplements

## 2016-01-15 NOTE — Assessment & Plan Note (Signed)
This has gone on for a long time and resistant to treatment

## 2016-01-15 NOTE — Progress Notes (Signed)
Subjective:    Patient ID: Danny Page, male    DOB: [birth year], 81 y.o.   MRN: [id number]  HPI Home visit for hospital follow up--wife here [facility] records reviewed Sent home on prednisone taper---this is finished  Budesonide nebs bid now Not getting albuterol with this--but has inhaler Wasn't taking the spiriva--- asked him to restart  Feels his breathing is some better Now has oxygen due to hypoxia in hospital Feels his breathing may be some better Not really coughing No fever but felt hot and cold (due to prednisone?)  He doesn't understand various aspects of this care Feels his memory has been affected No aphasia, focal weakness, facial droop, etc  Current Outpatient Prescriptions on File Prior to Visit  Medication Sig Dispense Refill  . acetaminophen (TYLENOL) 325 MG tablet Take 2 tablets (650 mg total) by mouth every 6 (six) hours as needed for mild pain (or Fever >/= 101).    Marland Kitchen albuterol (PROVENTIL HFA;VENTOLIN HFA) 108 (90 Base) MCG/ACT inhaler Inhale 2 puffs into the lungs every 6 (six) hours as needed for wheezing or shortness of breath. 1 Inhaler 1  . budesonide (PULMICORT) 0.25 MG/2ML nebulizer solution Take 2 mLs (0.25 mg total) by nebulization 2 (two) times daily. 60 mL 0  . ferrous sulfate 325 (65 FE) MG tablet Take 650 mg by mouth daily with breakfast.     . guaiFENesin-dextromethorphan (ROBITUSSIN DM) 100-10 MG/5ML syrup Take 5 mLs by mouth every 4 (four) hours as needed for cough. 118 mL 0  . tiotropium (SPIRIVA) 18 MCG inhalation capsule Place 18 mcg into inhaler and inhale daily.     No current facility-administered medications on file prior to visit.    No Known Allergies  Past Medical History  Diagnosis Date  . COPD (chronic obstructive pulmonary disease) ([place])   . Post herpetic neuralgia   . Hemorrhoids   . Anemia   . Stomach cancer ([place])     remission  . CAD (coronary artery disease) 9/14    mildly abnormal perfusion study  . BPH (benign  prostatic hypertrophy)     Past Surgical History  Procedure Laterality Date  . Stomach surgery      for ulcers, age 41  . Eus  03/30/2012    Procedure: UPPER ENDOSCOPIC ULTRASOUND (EUS) LINEAR;  Surgeon: Milus Banister, MD;  Location: [facility] ENDOSCOPY;  Service: Endoscopy;  Laterality: N/A;  . Cataract extraction w/ intraocular lens implant Right 2016    Family History  Problem Relation Age of Onset  . Heart disease Brother   . Cancer Mother 53    breast ca with mets to lymph nodes    Social History   Social History  . Marital Status: Married    Spouse Name: N/A  . Number of Children: N/A  . Years of Education: N/A   Occupational History  . retired, Chief Financial Officer    Social History Main Topics  . Smoking status: Former Smoker -- 45 years    Types: Cigarettes    Quit date: 10/26/2003  . Smokeless tobacco: Never Used  . Alcohol Use: No     Comment: quit about 2004, heavy in the past  . Drug Use: No  . Sexual Activity: No   Other Topics Concern  . Not on file   Social History Narrative   No living will   Requests wife as health care POA--no set form   Requests DNR--- done 01/08/15   Would not want a feeding tube   Review  of Systems Appetite remains poor--but no different No vomiting or diarrhea Sleeping well    Objective:   Physical Exam  Constitutional: No distress.  Comfortable sitting HR increased from 92--108 with 30 second walk and sats dropped from 96-92%  Neck: Normal range of motion. No thyromegaly present.  Cardiovascular: Normal rate, regular rhythm and normal heart sounds.  Exam reveals no gallop.   No murmur heard. Pulmonary/Chest: He is in respiratory distress. He has no wheezes. He has no rales.  Very little air movement but not tight or wheezy  Marked dyspnea with the 30 second walk  Abdominal: Soft. There is no tenderness.  Musculoskeletal: He exhibits no edema.  Lymphadenopathy:    He has no cervical adenopathy.  Psychiatric:  Usual detached  self          Assessment & Plan:

## 2016-01-16 DIAGNOSIS — D649 Anemia, unspecified: Secondary | ICD-10-CM | POA: Diagnosis not present

## 2016-01-16 DIAGNOSIS — E43 Unspecified severe protein-calorie malnutrition: Secondary | ICD-10-CM | POA: Diagnosis not present

## 2016-01-16 DIAGNOSIS — N400 Enlarged prostate without lower urinary tract symptoms: Secondary | ICD-10-CM | POA: Diagnosis not present

## 2016-01-16 DIAGNOSIS — Z85028 Personal history of other malignant neoplasm of stomach: Secondary | ICD-10-CM | POA: Diagnosis not present

## 2016-01-16 DIAGNOSIS — J441 Chronic obstructive pulmonary disease with (acute) exacerbation: Secondary | ICD-10-CM | POA: Diagnosis not present

## 2016-01-16 DIAGNOSIS — I2510 Atherosclerotic heart disease of native coronary artery without angina pectoris: Secondary | ICD-10-CM | POA: Diagnosis not present

## 2016-01-21 DIAGNOSIS — J441 Chronic obstructive pulmonary disease with (acute) exacerbation: Secondary | ICD-10-CM | POA: Diagnosis not present

## 2016-01-21 DIAGNOSIS — I2510 Atherosclerotic heart disease of native coronary artery without angina pectoris: Secondary | ICD-10-CM | POA: Diagnosis not present

## 2016-01-21 DIAGNOSIS — E43 Unspecified severe protein-calorie malnutrition: Secondary | ICD-10-CM | POA: Diagnosis not present

## 2016-01-21 DIAGNOSIS — N400 Enlarged prostate without lower urinary tract symptoms: Secondary | ICD-10-CM | POA: Diagnosis not present

## 2016-01-21 DIAGNOSIS — D649 Anemia, unspecified: Secondary | ICD-10-CM | POA: Diagnosis not present

## 2016-01-21 DIAGNOSIS — Z85028 Personal history of other malignant neoplasm of stomach: Secondary | ICD-10-CM | POA: Diagnosis not present

## 2016-01-22 DIAGNOSIS — N400 Enlarged prostate without lower urinary tract symptoms: Secondary | ICD-10-CM | POA: Diagnosis not present

## 2016-01-22 DIAGNOSIS — Z85028 Personal history of other malignant neoplasm of stomach: Secondary | ICD-10-CM | POA: Diagnosis not present

## 2016-01-22 DIAGNOSIS — I2510 Atherosclerotic heart disease of native coronary artery without angina pectoris: Secondary | ICD-10-CM | POA: Diagnosis not present

## 2016-01-22 DIAGNOSIS — J441 Chronic obstructive pulmonary disease with (acute) exacerbation: Secondary | ICD-10-CM | POA: Diagnosis not present

## 2016-01-22 DIAGNOSIS — E43 Unspecified severe protein-calorie malnutrition: Secondary | ICD-10-CM | POA: Diagnosis not present

## 2016-01-22 DIAGNOSIS — D649 Anemia, unspecified: Secondary | ICD-10-CM | POA: Diagnosis not present

## 2016-01-23 ENCOUNTER — Telehealth: Payer: Self-pay | Admitting: Internal Medicine

## 2016-01-23 DIAGNOSIS — Z85028 Personal history of other malignant neoplasm of stomach: Secondary | ICD-10-CM | POA: Diagnosis not present

## 2016-01-23 DIAGNOSIS — E43 Unspecified severe protein-calorie malnutrition: Secondary | ICD-10-CM | POA: Diagnosis not present

## 2016-01-23 DIAGNOSIS — J441 Chronic obstructive pulmonary disease with (acute) exacerbation: Secondary | ICD-10-CM | POA: Diagnosis not present

## 2016-01-23 DIAGNOSIS — N400 Enlarged prostate without lower urinary tract symptoms: Secondary | ICD-10-CM | POA: Diagnosis not present

## 2016-01-23 DIAGNOSIS — D649 Anemia, unspecified: Secondary | ICD-10-CM | POA: Diagnosis not present

## 2016-01-23 DIAGNOSIS — I2510 Atherosclerotic heart disease of native coronary artery without angina pectoris: Secondary | ICD-10-CM | POA: Diagnosis not present

## 2016-01-23 NOTE — Telephone Encounter (Signed)
Form placed in Dr Alla German InBox on his desk waiting for signature.

## 2016-01-23 NOTE — Telephone Encounter (Signed)
Form done No charge 

## 2016-01-23 NOTE — Telephone Encounter (Signed)
I left a voice mail on patient's wife's voice mail to let her know I faxed the form to [employer].

## 2016-01-23 NOTE — Telephone Encounter (Signed)
Pt wife dropped off [employer] form due to pt being on oxygen. Please advise and call wife when forms have been faxed.  Call spouse at [phone] or [phone]  Fax form to [phone].  Form in RX tower in front office

## 2016-01-26 DIAGNOSIS — N400 Enlarged prostate without lower urinary tract symptoms: Secondary | ICD-10-CM | POA: Diagnosis not present

## 2016-01-26 DIAGNOSIS — Z85028 Personal history of other malignant neoplasm of stomach: Secondary | ICD-10-CM | POA: Diagnosis not present

## 2016-01-26 DIAGNOSIS — E43 Unspecified severe protein-calorie malnutrition: Secondary | ICD-10-CM | POA: Diagnosis not present

## 2016-01-26 DIAGNOSIS — D649 Anemia, unspecified: Secondary | ICD-10-CM | POA: Diagnosis not present

## 2016-01-26 DIAGNOSIS — J441 Chronic obstructive pulmonary disease with (acute) exacerbation: Secondary | ICD-10-CM | POA: Diagnosis not present

## 2016-01-26 DIAGNOSIS — I2510 Atherosclerotic heart disease of native coronary artery without angina pectoris: Secondary | ICD-10-CM | POA: Diagnosis not present

## 2016-01-28 DIAGNOSIS — E43 Unspecified severe protein-calorie malnutrition: Secondary | ICD-10-CM | POA: Diagnosis not present

## 2016-01-28 DIAGNOSIS — J441 Chronic obstructive pulmonary disease with (acute) exacerbation: Secondary | ICD-10-CM | POA: Diagnosis not present

## 2016-01-28 DIAGNOSIS — I2510 Atherosclerotic heart disease of native coronary artery without angina pectoris: Secondary | ICD-10-CM | POA: Diagnosis not present

## 2016-01-28 DIAGNOSIS — D649 Anemia, unspecified: Secondary | ICD-10-CM | POA: Diagnosis not present

## 2016-01-28 DIAGNOSIS — N400 Enlarged prostate without lower urinary tract symptoms: Secondary | ICD-10-CM | POA: Diagnosis not present

## 2016-01-28 DIAGNOSIS — Z85028 Personal history of other malignant neoplasm of stomach: Secondary | ICD-10-CM | POA: Diagnosis not present

## 2016-01-30 ENCOUNTER — Other Ambulatory Visit: Payer: Self-pay

## 2016-01-30 MED ORDER — BUDESONIDE 0.25 MG/2ML IN SUSP: 0.2500 mg | Freq: Two times a day (BID) | RESPIRATORY_TRACT | Status: DC

## 2016-01-30 NOTE — Telephone Encounter (Signed)
Rx sent electronically.  

## 2016-02-02 DIAGNOSIS — I2510 Atherosclerotic heart disease of native coronary artery without angina pectoris: Secondary | ICD-10-CM | POA: Diagnosis not present

## 2016-02-02 DIAGNOSIS — Z85028 Personal history of other malignant neoplasm of stomach: Secondary | ICD-10-CM | POA: Diagnosis not present

## 2016-02-02 DIAGNOSIS — N400 Enlarged prostate without lower urinary tract symptoms: Secondary | ICD-10-CM | POA: Diagnosis not present

## 2016-02-02 DIAGNOSIS — J441 Chronic obstructive pulmonary disease with (acute) exacerbation: Secondary | ICD-10-CM | POA: Diagnosis not present

## 2016-02-02 DIAGNOSIS — D649 Anemia, unspecified: Secondary | ICD-10-CM | POA: Diagnosis not present

## 2016-02-02 DIAGNOSIS — E43 Unspecified severe protein-calorie malnutrition: Secondary | ICD-10-CM | POA: Diagnosis not present

## 2016-02-04 NOTE — Telephone Encounter (Signed)
Danny Page called to ck on status of budesonide; spoke with Danny Page at [address] and rx ready for pick up; cost to pt $ 26.51. Danny Page voiced understanding and will pick up at pharmacy.

## 2016-02-05 ENCOUNTER — Other Ambulatory Visit: Payer: Self-pay

## 2016-02-05 MED ORDER — BUDESONIDE 0.25 MG/2ML IN SUSP: 0.2500 mg | Freq: Two times a day (BID) | RESPIRATORY_TRACT | Status: DC

## 2016-02-05 NOTE — Telephone Encounter (Signed)
Received a fax from [facility] stating they needed a new rx with the dx code on it for it to be filled under part b. Sent new rx with dx code

## 2016-02-08 DIAGNOSIS — J449 Chronic obstructive pulmonary disease, unspecified: Secondary | ICD-10-CM | POA: Diagnosis not present

## 2016-02-09 DIAGNOSIS — J449 Chronic obstructive pulmonary disease, unspecified: Secondary | ICD-10-CM | POA: Diagnosis not present

## 2016-02-11 DIAGNOSIS — E43 Unspecified severe protein-calorie malnutrition: Secondary | ICD-10-CM | POA: Diagnosis not present

## 2016-02-11 DIAGNOSIS — D649 Anemia, unspecified: Secondary | ICD-10-CM | POA: Diagnosis not present

## 2016-02-11 DIAGNOSIS — N400 Enlarged prostate without lower urinary tract symptoms: Secondary | ICD-10-CM | POA: Diagnosis not present

## 2016-02-11 DIAGNOSIS — J441 Chronic obstructive pulmonary disease with (acute) exacerbation: Secondary | ICD-10-CM | POA: Diagnosis not present

## 2016-02-11 DIAGNOSIS — Z85028 Personal history of other malignant neoplasm of stomach: Secondary | ICD-10-CM | POA: Diagnosis not present

## 2016-02-11 DIAGNOSIS — I2510 Atherosclerotic heart disease of native coronary artery without angina pectoris: Secondary | ICD-10-CM | POA: Diagnosis not present

## 2016-02-17 DIAGNOSIS — N400 Enlarged prostate without lower urinary tract symptoms: Secondary | ICD-10-CM | POA: Diagnosis not present

## 2016-02-17 DIAGNOSIS — D649 Anemia, unspecified: Secondary | ICD-10-CM | POA: Diagnosis not present

## 2016-02-17 DIAGNOSIS — E43 Unspecified severe protein-calorie malnutrition: Secondary | ICD-10-CM | POA: Diagnosis not present

## 2016-02-17 DIAGNOSIS — I2510 Atherosclerotic heart disease of native coronary artery without angina pectoris: Secondary | ICD-10-CM | POA: Diagnosis not present

## 2016-02-17 DIAGNOSIS — J441 Chronic obstructive pulmonary disease with (acute) exacerbation: Secondary | ICD-10-CM | POA: Diagnosis not present

## 2016-02-17 DIAGNOSIS — Z85028 Personal history of other malignant neoplasm of stomach: Secondary | ICD-10-CM | POA: Diagnosis not present

## 2016-03-01 ENCOUNTER — Ambulatory Visit (HOSPITAL_BASED_OUTPATIENT_CLINIC_OR_DEPARTMENT_OTHER): Payer: Medicare Other | Admitting: Nurse Practitioner

## 2016-03-01 ENCOUNTER — Telehealth: Payer: Self-pay | Admitting: Nurse Practitioner

## 2016-03-01 ENCOUNTER — Telehealth: Payer: Self-pay | Admitting: *Deleted

## 2016-03-01 ENCOUNTER — Other Ambulatory Visit: Payer: Self-pay | Admitting: *Deleted

## 2016-03-01 ENCOUNTER — Ambulatory Visit (HOSPITAL_BASED_OUTPATIENT_CLINIC_OR_DEPARTMENT_OTHER): Payer: Medicare Other

## 2016-03-01 VITALS — BP 94/48 | HR 97 | Temp 97.9°F | Resp 19 | Ht 66.0 in | Wt 104.5 lb

## 2016-03-01 DIAGNOSIS — D509 Iron deficiency anemia, unspecified: Secondary | ICD-10-CM

## 2016-03-01 DIAGNOSIS — R109 Unspecified abdominal pain: Secondary | ICD-10-CM | POA: Diagnosis not present

## 2016-03-01 DIAGNOSIS — C169 Malignant neoplasm of stomach, unspecified: Secondary | ICD-10-CM | POA: Diagnosis not present

## 2016-03-01 LAB — CBC WITH DIFFERENTIAL/PLATELET
BASO%: 1.2 % (ref 0.0–2.0)
Basophils Absolute: 0.1 10*3/uL (ref 0.0–0.1)
EOS ABS: 0.1 10*3/uL (ref 0.0–0.5)
EOS%: 1.5 % (ref 0.0–7.0)
HEMATOCRIT: 32.2 % — AB (ref 38.4–49.9)
HEMOGLOBIN: 10.2 g/dL — AB (ref 13.0–17.1)
LYMPH#: 1.9 10*3/uL (ref 0.9–3.3)
LYMPH%: 23.3 % (ref 14.0–49.0)
MCH: 25.1 pg — ABNORMAL LOW (ref 27.2–33.4)
MCHC: 31.7 g/dL — ABNORMAL LOW (ref 32.0–36.0)
MCV: 79.3 fL (ref 79.3–98.0)
MONO#: 0.7 10*3/uL (ref 0.1–0.9)
MONO%: 8.3 % (ref 0.0–14.0)
NEUT%: 65.7 % (ref 39.0–75.0)
NEUTROS ABS: 5.3 10*3/uL (ref 1.5–6.5)
PLATELETS: 264 10*3/uL (ref 140–400)
RBC: 4.06 10*6/uL — ABNORMAL LOW (ref 4.20–5.82)
RDW: 17.9 % — ABNORMAL HIGH (ref 11.0–14.6)
WBC: 8.1 10*3/uL (ref 4.0–10.3)
nRBC: 0 % (ref 0–0)

## 2016-03-01 NOTE — Telephone Encounter (Signed)
Pt condition reviewed with Dr. Benay Spice.  Pt to be seen today at 2:15PM by L. Marcello Moores NP.  Call placed to pt.'s wife and they are agreeable with 2:15PM time for today.

## 2016-03-01 NOTE — Progress Notes (Addendum)
[facility] OFFICE PROGRESS NOTE   Diagnosis:  Gastric cancer, iron deficiency anemia  INTERVAL HISTORY:   Danny Page returns prior to scheduled follow-up for evaluation of abdominal pain. A few days ago he had a "stabbing, knifelike" pain at the left abdomen which lasted 5 or 10 seconds. The pain has not recurred. He has had similar pain occasionally in the past. He denies recent nausea/vomiting. No diarrhea. No fever. He reports a good appetite.  Objective:  Vital signs in last 24 hours:  Blood pressure 94/48, pulse 97, temperature 97.9 F (36.6 C), temperature source Oral, resp. rate 19, height 5\' 6"  (1.676 m), weight 104 lb 8 oz (47.401 kg), SpO2 97 %.    HEENT: Conjunctival pallor. No thrush or ulcers. Lymphatics: No palpable cervical, supraclavicular or axillary lymph nodes. Resp: Distant breath sounds. No respiratory distress. Cardio: Regular rate and rhythm. GI: Abdomen soft and nontender. No hepatomegaly. No mass. No splenomegaly. Vascular: No leg edema.   Lab Results:  Lab Results  Component Value Date   WBC 13.2* 01/07/2016   HGB 10.4* 01/07/2016   HCT 32.5* 01/07/2016   MCV 75.3* 01/07/2016   PLT 206 01/07/2016   NEUTROABS 5.2 01/05/2016    Imaging:  No results found.  Medications: I have reviewed the patient's current medications.  Assessment/Plan: 1. Adenocarcinoma of the stomach. Adenocarcinoma found in the gastric remnant status post previous partial gastrectomy/Billroth II anastomosis. No evidence of metastatic disease by physical exam or CT of the abdomen/pelvis. Staging EUS on 03/30/2012 confirmed a uT3 uN0 tumor. He began radiation and concurrent Xeloda chemotherapy on 04/18/2012. Xeloda was subsequently placed on hold due to chest pain. Xeloda was resumed on 04/26/2012 with no further chest pain. The Xeloda dose was titrated to 1000 mg twice daily beginning 04/28/2012. He completed treatment on 05/30/2012. Restaging CT on 08/01/2012  with no evidence of metastatic disease. -Upper endoscopy 05/08/2013 with abnormal mucosa at the lesser curvature and no tumor mass seen, biopsy negative for malignancy  -CT 11/26/2015 revealed increased gastric wall thickening and thickening at the anterior bladder 2. Remote partial gastrectomy/Billroth II anastomosis secondary to ulcer disease. 3. Iron deficiency anemia secondary to the gastric mass status post red cell transfusion 03/17/2012.  4. COPD. 5. History of colon polyps. 6. History of Chest pain/subxiphoid pain-most likely related to radiation esophagitis/gastritis or the gastric mass 7. Anorexia/weight loss-? Secondary to gastric cancer or COPD, depression 8. Depression-he started a trial of Remeron on 06/13/2012, no longer taking Remeron 9. Dyspnea/low-grade fever at an office visit on 06/20/2012-? COPD flare,? Radiation pneumonitis-he completed a trial of prednisone. The symptoms partially improved. 10. History of Orthostasis 11. History of Dysphagia-none today 12. Indeterminate 8 mm left liver lesion on the CT 08/01/2012.   Disposition: Danny Page appears stable. Etiology of abdominal pain is unclear. We discussed a referral to Dr. Hilarie Fredrickson for a repeat upper endoscopy. He declines this at present. He will contact the office if the pain worsens or begins to occur more frequently.  He has a history of iron deficiency anemia. We will obtain a follow-up CBC today.  He will return for his scheduled follow-up visit 04/13/2016. He will contact the office in the interim as outlined above or with any other problems.   Patient seen with Dr. Benay Spice.    Danny Page ANP/GNP-BC   03/01/2016  2:31 PM   this was a shared visit with Danny Page. Danny Page was interviewed and examined.   He will return for an office  visit as scheduled next month.  Danny Page, M.D.

## 2016-03-01 NOTE — Telephone Encounter (Signed)
added lab for pt-sent to lab

## 2016-03-01 NOTE — Telephone Encounter (Signed)
Spouse called with [facility] asking to see Dr. Benay Spice.  "He has left stomach pain.  Started two days ago.  Stabbing pain.  This is area where he received radiation.  Has not tried anything for pain.  He's the type that likes to see the doctor.  Last BM was this morning and normal.  Pain does not move to any other area of his body.  Return number [phone] or cell [phone]."

## 2016-03-02 ENCOUNTER — Telehealth: Payer: Self-pay

## 2016-03-02 DIAGNOSIS — N400 Enlarged prostate without lower urinary tract symptoms: Secondary | ICD-10-CM | POA: Diagnosis not present

## 2016-03-02 DIAGNOSIS — J441 Chronic obstructive pulmonary disease with (acute) exacerbation: Secondary | ICD-10-CM | POA: Diagnosis not present

## 2016-03-02 DIAGNOSIS — I2510 Atherosclerotic heart disease of native coronary artery without angina pectoris: Secondary | ICD-10-CM | POA: Diagnosis not present

## 2016-03-02 DIAGNOSIS — Z85028 Personal history of other malignant neoplasm of stomach: Secondary | ICD-10-CM | POA: Diagnosis not present

## 2016-03-02 DIAGNOSIS — D649 Anemia, unspecified: Secondary | ICD-10-CM | POA: Diagnosis not present

## 2016-03-02 DIAGNOSIS — E43 Unspecified severe protein-calorie malnutrition: Secondary | ICD-10-CM | POA: Diagnosis not present

## 2016-03-02 NOTE — Telephone Encounter (Signed)
Angie nurse with [facility] left v/m; pt does not always wear oxygen when he goes out; pts wife is going to get a pulse ox. Angie wants to know if Dr [name] would give range of numbers of what pulse oximetry should be. Angie request cb.

## 2016-03-02 NOTE — Telephone Encounter (Signed)
-----   Message from Owens Shark, NP sent at 03/02/2016 10:21 AM EDT ----- Please have him increase iron to bid

## 2016-03-02 NOTE — Telephone Encounter (Signed)
Called and spoke with patients wife. Informed her to have pt to increase his iron to twice a day. Pt wife verbalized understanding and denies and questions or concerns at this time

## 2016-03-03 NOTE — Telephone Encounter (Signed)
Left message for Angie to call back.

## 2016-03-03 NOTE — Telephone Encounter (Signed)
He runs low--- he should shoot for 88% or over. Very resistant to wearing oxygen when he is moving around--which is when he needs it most

## 2016-03-03 NOTE — Telephone Encounter (Signed)
Spoke to [employer] She will let the wife know. He will be D/C from home health on the 15th.

## 2016-03-08 DIAGNOSIS — D649 Anemia, unspecified: Secondary | ICD-10-CM | POA: Diagnosis not present

## 2016-03-08 DIAGNOSIS — N400 Enlarged prostate without lower urinary tract symptoms: Secondary | ICD-10-CM | POA: Diagnosis not present

## 2016-03-08 DIAGNOSIS — I2510 Atherosclerotic heart disease of native coronary artery without angina pectoris: Secondary | ICD-10-CM | POA: Diagnosis not present

## 2016-03-08 DIAGNOSIS — E43 Unspecified severe protein-calorie malnutrition: Secondary | ICD-10-CM | POA: Diagnosis not present

## 2016-03-08 DIAGNOSIS — J441 Chronic obstructive pulmonary disease with (acute) exacerbation: Secondary | ICD-10-CM | POA: Diagnosis not present

## 2016-03-08 DIAGNOSIS — Z85028 Personal history of other malignant neoplasm of stomach: Secondary | ICD-10-CM | POA: Diagnosis not present

## 2016-03-09 DIAGNOSIS — J449 Chronic obstructive pulmonary disease, unspecified: Secondary | ICD-10-CM | POA: Diagnosis not present

## 2016-03-10 DIAGNOSIS — J449 Chronic obstructive pulmonary disease, unspecified: Secondary | ICD-10-CM | POA: Diagnosis not present

## 2016-03-15 ENCOUNTER — Telehealth: Payer: Self-pay

## 2016-03-15 NOTE — Telephone Encounter (Signed)
V/M left that Randall Hiss with [facility] will bring paperwork to [facility] on 03/16/16 for Dr [name]. This is FYI to Dr [name].

## 2016-03-15 NOTE — Telephone Encounter (Signed)
I didn't refer him for hospice and as of my last visit---I am not sure he is hospice eligible Please let them know that Also, check with his wife to see if things have changed much since my last visit-- I will probably be coming back out in early June

## 2016-03-16 NOTE — Telephone Encounter (Signed)
Patient's wife returned Dr.Letvak's call.  I let her know Dr.Letvak will try and call back tomorrow.  She can be reached at [phone].

## 2016-03-16 NOTE — Telephone Encounter (Signed)
I need to discuss this with wife Unable to reach her today at either number---I will try again tomorrow

## 2016-03-16 NOTE — Telephone Encounter (Signed)
The person who called was the wife. The number that was left was the home number. I left a message asking someone call me back.

## 2016-03-16 NOTE — Telephone Encounter (Signed)
Eric @ [facility] left paperwork for dr [name] to sign In Dr Alla German IN BOX

## 2016-03-17 NOTE — Telephone Encounter (Signed)
Discussed with wife----[facility] found him through wife looking on internet about COPD and they had a pop up. He is not hospice eligible  Please let [facility] know--this was inappropriate.

## 2016-03-17 NOTE — Telephone Encounter (Signed)
I spoke to [place] at [employer].  I let him know Dr.Letvak said patient isn't eligible for Hospice. I faxed the form back to Hospice.

## 2016-03-18 ENCOUNTER — Ambulatory Visit (HOSPITAL_BASED_OUTPATIENT_CLINIC_OR_DEPARTMENT_OTHER): Payer: Medicare Other | Admitting: Nurse Practitioner

## 2016-03-18 ENCOUNTER — Ambulatory Visit (HOSPITAL_COMMUNITY)
Admission: RE | Admit: 2016-03-18 | Discharge: 2016-03-18 | Disposition: A | Discharge status: Home / Self Care | Payer: Medicare Other | Source: Ambulatory Visit | Attending: Nurse Practitioner | Admitting: Nurse Practitioner

## 2016-03-18 ENCOUNTER — Other Ambulatory Visit: Payer: Self-pay | Admitting: Nurse Practitioner

## 2016-03-18 ENCOUNTER — Encounter: Payer: Self-pay | Admitting: Nurse Practitioner

## 2016-03-18 ENCOUNTER — Ambulatory Visit (HOSPITAL_BASED_OUTPATIENT_CLINIC_OR_DEPARTMENT_OTHER): Payer: Medicare Other

## 2016-03-18 ENCOUNTER — Telehealth: Payer: Self-pay | Admitting: Oncology

## 2016-03-18 ENCOUNTER — Encounter: Payer: Self-pay | Admitting: *Deleted

## 2016-03-18 VITALS — BP 127/54 | HR 89 | Resp 18

## 2016-03-18 VITALS — BP 76/48 | HR 88 | Temp 97.8°F | Resp 18 | Ht 66.0 in | Wt 106.4 lb

## 2016-03-18 DIAGNOSIS — R140 Abdominal distension (gaseous): Secondary | ICD-10-CM | POA: Insufficient documentation

## 2016-03-18 DIAGNOSIS — C162 Malignant neoplasm of body of stomach: Secondary | ICD-10-CM | POA: Diagnosis not present

## 2016-03-18 DIAGNOSIS — C169 Malignant neoplasm of stomach, unspecified: Secondary | ICD-10-CM

## 2016-03-18 DIAGNOSIS — E279 Disorder of adrenal gland, unspecified: Secondary | ICD-10-CM | POA: Insufficient documentation

## 2016-03-18 DIAGNOSIS — E860 Dehydration: Secondary | ICD-10-CM

## 2016-03-18 DIAGNOSIS — R1011 Right upper quadrant pain: Secondary | ICD-10-CM | POA: Diagnosis not present

## 2016-03-18 DIAGNOSIS — R109 Unspecified abdominal pain: Secondary | ICD-10-CM | POA: Diagnosis not present

## 2016-03-18 DIAGNOSIS — N329 Bladder disorder, unspecified: Secondary | ICD-10-CM | POA: Diagnosis not present

## 2016-03-18 DIAGNOSIS — I959 Hypotension, unspecified: Secondary | ICD-10-CM | POA: Diagnosis not present

## 2016-03-18 LAB — CBC WITH DIFFERENTIAL/PLATELET
BASO%: 0.4 % (ref 0.0–2.0)
Basophils Absolute: 0 10*3/uL (ref 0.0–0.1)
EOS ABS: 0.1 10*3/uL (ref 0.0–0.5)
EOS%: 0.7 % (ref 0.0–7.0)
HEMATOCRIT: 35.4 % — AB (ref 38.4–49.9)
HEMOGLOBIN: 11.1 g/dL — AB (ref 13.0–17.1)
LYMPH#: 1.8 10*3/uL (ref 0.9–3.3)
LYMPH%: 22.6 % (ref 14.0–49.0)
MCH: 25.2 pg — ABNORMAL LOW (ref 27.2–33.4)
MCHC: 31.4 g/dL — ABNORMAL LOW (ref 32.0–36.0)
MCV: 80.2 fL (ref 79.3–98.0)
MONO#: 0.6 10*3/uL (ref 0.1–0.9)
MONO%: 7.2 % (ref 0.0–14.0)
NEUT%: 69.1 % (ref 39.0–75.0)
NEUTROS ABS: 5.5 10*3/uL (ref 1.5–6.5)
PLATELETS: 266 10*3/uL (ref 140–400)
RBC: 4.42 10*6/uL (ref 4.20–5.82)
RDW: 18.5 % — AB (ref 11.0–14.6)
WBC: 7.9 10*3/uL (ref 4.0–10.3)

## 2016-03-18 LAB — COMPREHENSIVE METABOLIC PANEL
ALBUMIN: 3.5 g/dL (ref 3.5–5.0)
ALK PHOS: 91 U/L (ref 40–150)
ALT: <9 U/L (ref 0–55)
ANION GAP: 10 meq/L (ref 3–11)
AST: 19 U/L (ref 5–34)
BILIRUBIN TOTAL: 0.34 mg/dL (ref 0.20–1.20)
BUN: 12.6 mg/dL (ref 7.0–26.0)
CO2: 25 mEq/L (ref 22–29)
Calcium: 8.9 mg/dL (ref 8.4–10.4)
Chloride: 103 mEq/L (ref 98–109)
Creatinine: 0.9 mg/dL (ref 0.7–1.3)
EGFR: 82 mL/min/{1.73_m2} — AB (ref >90)
GLUCOSE: 94 mg/dL (ref 70–140)
Potassium: 4.8 mEq/L (ref 3.5–5.1)
SODIUM: 138 meq/L (ref 136–145)
TOTAL PROTEIN: 6.7 g/dL (ref 6.4–8.3)

## 2016-03-18 MED ORDER — IOPAMIDOL (ISOVUE-300) INJECTION 61%
100.0000 mL | Freq: Once | INTRAVENOUS | Status: AC | PRN
  Administered 2016-03-18: 100 mL via INTRAVENOUS

## 2016-03-18 MED ORDER — SODIUM CHLORIDE 0.9 % IV SOLN
Freq: Once | INTRAVENOUS | Status: AC
  Administered 2016-03-18: 13:00:00 via INTRAVENOUS

## 2016-03-18 MED ORDER — DIATRIZOATE MEGLUMINE & SODIUM 66-10 % PO SOLN: 30.0000 mL | Freq: Once | ORAL | Status: DC

## 2016-03-18 NOTE — Assessment & Plan Note (Signed)
Patient admits to decreased oral intake recently secondary to minimal appetite.  He feels slightly weak and dehydrated today.  Blood pressure on initial check was low at 76/48.  Confirmed that patient does not take any blood pressure medications or diuretics.  Labs obtained today were all essentially within normal limits.  Patient received IV fluid rehydration while at the [place]; and blood pressure improved to 127/54.  Patient was encouraged to push fluids is much as possible.

## 2016-03-18 NOTE — Progress Notes (Signed)
SYMPTOM MANAGEMENT CLINIC    Chief Complaint: Abdominal pain  HPI:  Danny Page 82 y.o. male diagnosed with gastric cancer.  Currently undergoing observation only.  Patient presents to the [facility] today with complaint of three-day history increased abdominal discomfort.  He also feels slightly dehydrated; and was noted have a low blood pressure is 76/48 on initial check.  He denies any recent fevers or chills.  He also denies any other issues with nausea, vomiting, diarrhea, or constipation.    No history exists.    Review of Systems  Constitutional: Positive for malaise/fatigue.  Gastrointestinal: Positive for abdominal pain.  Neurological: Positive for weakness.  All other systems reviewed and are negative.   Past Medical History  Diagnosis Date  . COPD (chronic obstructive pulmonary disease) ([place])   . Post herpetic neuralgia   . Hemorrhoids   . Anemia   . Stomach cancer ([place])     remission  . CAD (coronary artery disease) 9/14    mildly abnormal perfusion study  . BPH (benign prostatic hypertrophy)     Past Surgical History  Procedure Laterality Date  . Stomach surgery      for ulcers, age 65  . Eus  03/30/2012    Procedure: UPPER ENDOSCOPIC ULTRASOUND (EUS) LINEAR;  Surgeon: Milus Banister, MD;  Location: [facility];  Service: Endoscopy;  Laterality: N/A;  . Cataract extraction w/ intraocular lens implant Right 2016    has POSTHERPETIC NEURALGIA; COPD with emphysema ([place]); Vertigo; Gastric cancer ([place]); CAD (coronary artery disease); Routine general medical examination at a health care facility; BPH (benign prostatic hypertrophy); Chronic recurrent major depressive disorder ([place]); Malnutrition of mild degree ([place]); Advance directive discussed with patient; LLQ pain; COPD exacerbation ([place]); Protein-calorie malnutrition, severe; Acute respiratory failure with hypoxia ([place]); Dehydration; and Hypotension on his problem list.    has No Known Allergies.      Medication List       This list is accurate as of: 03/18/16 11:00 PM.  Always use your most recent med list.               acetaminophen 325 MG tablet  Commonly known as:  TYLENOL  Take 2 tablets (650 mg total) by mouth every 6 (six) hours as needed for mild pain (or Fever >/= 101).     albuterol 108 (90 Base) MCG/ACT inhaler  Commonly known as:  PROVENTIL HFA;VENTOLIN HFA  Inhale 2 puffs into the lungs every 6 (six) hours as needed for wheezing or shortness of breath.     albuterol 1.25 MG/3ML nebulizer solution  Commonly known as:  ACCUNEB  Take 3 mLs (1.25 mg total) by nebulization 2 (two) times daily. With budesonide and bid prn for SOB     aspirin 325 MG EC tablet  Take 325 mg by mouth daily.     budesonide 0.25 MG/2ML nebulizer solution  Commonly known as:  PULMICORT  Take 2 mLs (0.25 mg total) by nebulization 2 (two) times daily.     budesonide 0.25 MG/2ML nebulizer solution  Commonly known as:  PULMICORT  Take 2 mLs (0.25 mg total) by nebulization 2 (two) times daily.     ferrous sulfate 325 (65 FE) MG tablet  Take 650 mg by mouth daily with breakfast.     guaiFENesin-dextromethorphan 100-10 MG/5ML syrup  Commonly known as:  ROBITUSSIN DM  Take 5 mLs by mouth every 4 (four) hours as needed for cough.     tiotropium 18 MCG inhalation capsule  Commonly known as:  SPIRIVA  Place 18 mcg into inhaler and inhale daily.         PHYSICAL EXAMINATION  Oncology Vitals 03/18/2016 03/18/2016  Height - 168 cm  Weight - 48.263 kg  Weight (lbs) - 106 lbs 6 oz  BMI (kg/m2) - 17.17 kg/m2  Temp - 97.8  Pulse 89 88  Resp 18 18  SpO2 - 97  BSA (m2) - 1.5 m2   BP Readings from Last 2 Encounters:  03/18/16 127/54  03/18/16 76/48    Physical Exam  Constitutional: He is oriented to person, place, and time. He appears malnourished and dehydrated. He appears unhealthy. He appears cachectic.  HENT:  Head: Normocephalic and atraumatic.  Mouth/Throat: Oropharynx is clear  and moist.  Eyes: Conjunctivae and EOM are normal. Pupils are equal, round, and reactive to light. Right eye exhibits no discharge. Left eye exhibits no discharge. No scleral icterus.  Neck: Normal range of motion. Neck supple. No JVD present. No tracheal deviation present. No thyromegaly present.  Cardiovascular: Normal rate, regular rhythm, normal heart sounds and intact distal pulses.   Pulmonary/Chest: Effort normal and breath sounds normal. No respiratory distress. He has no wheezes. He has no rales. He exhibits no tenderness.  Abdominal: Soft. Bowel sounds are normal. He exhibits no distension. There is tenderness. There is no rebound and no guarding.  Mild tenderness to the left-sided patient's abdomen and to the pelvic region with palpation.  No rebound tenderness.  Musculoskeletal: Normal range of motion. He exhibits no edema or tenderness.  Lymphadenopathy:    He has no cervical adenopathy.  Neurological: He is alert and oriented to person, place, and time. Gait normal.  Skin: Skin is warm and dry. No rash noted. No erythema.  Psychiatric: Affect normal.  Nursing note and vitals reviewed.   LABORATORY DATA:. Appointment on 03/18/2016  Component Date Value Ref Range Status  . WBC 03/18/2016 7.9  4.0 - 10.3 10e3/uL Final  . NEUT# 03/18/2016 5.5  1.5 - 6.5 10e3/uL Final  . HGB 03/18/2016 11.1* 13.0 - 17.1 g/dL Final  . HCT 03/18/2016 35.4* 38.4 - 49.9 % Final  . Platelets 03/18/2016 266  140 - 400 10e3/uL Final  . MCV 03/18/2016 80.2  79.3 - 98.0 fL Final  . MCH 03/18/2016 25.2* 27.2 - 33.4 pg Final  . MCHC 03/18/2016 31.4* 32.0 - 36.0 g/dL Final  . RBC 03/18/2016 4.42  4.20 - 5.82 10e6/uL Final  . RDW 03/18/2016 18.5* 11.0 - 14.6 % Final  . lymph# 03/18/2016 1.8  0.9 - 3.3 10e3/uL Final  . MONO# 03/18/2016 0.6  0.1 - 0.9 10e3/uL Final  . Eosinophils Absolute 03/18/2016 0.1  0.0 - 0.5 10e3/uL Final  . Basophils Absolute 03/18/2016 0.0  0.0 - 0.1 10e3/uL Final  . NEUT%  03/18/2016 69.1  39.0 - 75.0 % Final  . LYMPH% 03/18/2016 22.6  14.0 - 49.0 % Final  . MONO% 03/18/2016 7.2  0.0 - 14.0 % Final  . EOS% 03/18/2016 0.7  0.0 - 7.0 % Final  . BASO% 03/18/2016 0.4  0.0 - 2.0 % Final  . Sodium 03/18/2016 138  136 - 145 mEq/L Final  . Potassium 03/18/2016 4.8 Sl. Hemolysis  3.5 - 5.1 mEq/L Final  . Chloride 03/18/2016 103  98 - 109 mEq/L Final  . CO2 03/18/2016 25  22 - 29 mEq/L Final  . Glucose 03/18/2016 94  70 - 140 mg/dl Final   Glucose reference range is for nonfasting patients. Fasting glucose  reference range is 70- 100.  Marland Kitchen BUN 03/18/2016 12.6  7.0 - 26.0 mg/dL Final  . Creatinine 03/18/2016 0.9  0.7 - 1.3 mg/dL Final  . Total Bilirubin 03/18/2016 0.34  0.20 - 1.20 mg/dL Final  . Alkaline Phosphatase 03/18/2016 91  40 - 150 U/L Final  . AST 03/18/2016 19  5 - 34 U/L Final  . ALT 03/18/2016 <9  0 - 55 U/L Final  . Total Protein 03/18/2016 6.7  6.4 - 8.3 g/dL Final  . Albumin 03/18/2016 3.5  3.5 - 5.0 g/dL Final  . Calcium 03/18/2016 8.9  8.4 - 10.4 mg/dL Final  . Anion Gap 03/18/2016 10  3 - 11 mEq/L Final  . EGFR 03/18/2016 82* >90 ml/min/1.73 m2 Final   eGFR is calculated using the CKD-EPI Creatinine Equation (2009)    RADIOGRAPHIC STUDIES: Ct Abdomen Pelvis W Contrast  03/18/2016  CLINICAL DATA:  New constant RIGHT upper quadrant pain. History of malignant neoplasm of the stomach. EXAM: CT ABDOMEN AND PELVIS WITH CONTRAST TECHNIQUE: Multidetector CT imaging of the abdomen and pelvis was performed using the standard protocol following bolus administration of intravenous contrast. CONTRAST:  123m ISOVUE-300 IOPAMIDOL (ISOVUE-300) INJECTION 61% COMPARISON:  CT 11/26/2015 FINDINGS: Lower chest: Lung bases are clear. Hepatobiliary:  No focal hepatic lesion.  Normal gallbladder Pancreas: Mild atrophy of the pancreas. No pancreatic duct dilatation or lesion. Spleen: Normal spleen Adrenals/urinary tract: There is nodular thickening of the adrenal glands  unchanged from prior. Again noted a broad lesion along the anterior LEFT wall the bladder measuring 3.6 cm in length along the bladder wall and 1.5 cm in depth. Lesion is increased in length along the bladder wall compared to prior. In coronal rotation lesion appears similar (image 38, series 602) . Stomach/Bowel: Again demonstrated irregular thickening along the gastric cardia extending into the lower esophagus measuring up to 2.5 cm in thickness compared to 2.3 cm on prior for no change. There is no evidence of gastric outlet obstruction. Gastrojejunostomy patent. Small bowel appendix and cecum normal. Diverticulum the descending colon without acute inflammation. Rectosigmoid colon is normal. Vascular/Lymphatic: Abdominal aorta is normal caliber with atherosclerotic calcification. There is no retroperitoneal or periportal lymphadenopathy. No pelvic lymphadenopathy. Reproductive: Enhancement of the transitional zone of prostate gland. No change. Other: No free fluid. Musculoskeletal: No aggressive osseous lesion. IMPRESSION: 1. Irregular thickening of the gastric wall involving the gastric cardia and the gastroesophageal junction consistent with gastric carcinoma. 2. Nodular thickening of the adrenal glands is indeterminate but similar to comparison exam. 3. Broad mucosal lesion involving the anterior wall of the bladder. Recommend cystoscopy for further evaluation. Electronically Signed   By: SSuzy BouchardM.D.   On: 03/18/2016 17:23    ASSESSMENT/PLAN:    Gastric cancer ([facility] Patient is currently undergoing observation only for previous diagnosis of gastric cancer.  [facility]day history of left upper and lower quadrant discomfort.  He states that the pain become more persistent in the last several days.  He denies any nausea, vomiting, diarrhea, or constipation.  He denies any recent fevers or chills.  Exam today reveals abdomen soft.  He has some mild  tenderness with palpation to the left upper and lower quadrant.  There is no flank pain.  Bowel sounds positive in all 4 quads.  Patient will undergo a stat CT with contrast of the abdomen and pelvis today for further evaluation.  Advised patient would call patient back in the morning.  03/19/2016 to  review all scan results.  Patient was advised to go directly to the emergency department in the meantime if he develops any worsening symptoms whatsoever.  Dehydration Patient admits to decreased oral intake recently secondary to minimal appetite.  He feels slightly weak and dehydrated today.  Blood pressure on initial check was low at 76/48.  Confirmed that patient does not take any blood pressure medications or diuretics.  Labs obtained today were all essentially within normal limits.  Patient received IV fluid rehydration while at the [facility]; and blood pressure improved to 127/54.  Patient was encouraged to push fluids is much as possible.  Hypotension Blood pressure on initial check was 76/48.  Patient feels dehydrated today; and is complaining of increased fatigue and weakness as well.  Confirmed that patient is not taking any blood pressure medications or diuretics.  Patient appears mildly dehydrated today; will receive IV fluid rehydration.  Following IV fluids-patient's blood pressure improved to 127/54.  Patient was advised to continue pushing fluids at home is much as possible.   Patient stated understanding of all instructions; and was in agreement with this plan of care. The patient knows to call the clinic with any problems, questions or concerns.   Total time spent with patient was 25 minutes;  with greater than 75 percent of that time spent in face to face counseling regarding patient's symptoms,  and coordination of care and follow up.  Disclaimer:This dictation was prepared with Dragon/digital dictation along with [employer]. Any transcriptional errors  that result from this process are unintentional.  Drue Second, NP 03/18/2016

## 2016-03-18 NOTE — Progress Notes (Signed)
Message from patients wife stating that she would like her husband to be seen today d/t a new abdominal pain that patient has had for 2 days now.  She states that the pain is constant and that pt is only able to eat two bites and then he is full.  He is not nauseated and is having no constipation or diarrhea.  He has no pain medicine at home per pt.'s wife. Michel Harrow NP is able to see patient this AM and pt.'s wife is agreeable to coming in at 11:00AM for appointment.

## 2016-03-18 NOTE — Assessment & Plan Note (Signed)
Patient is currently undergoing observation only for previous diagnosis of gastric cancer.  Patient presented to the [facility] today with three-day history of left upper and lower quadrant discomfort.  He states that the pain become more persistent in the last several days.  He denies any nausea, vomiting, diarrhea, or constipation.  He denies any recent fevers or chills.  Exam today reveals abdomen soft.  He has some mild tenderness with palpation to the left upper and lower quadrant.  There is no flank pain.  Bowel sounds positive in all 4 quads.  Patient will undergo a stat CT with contrast of the abdomen and pelvis today for further evaluation.  Advised patient would call patient back in the morning.  03/19/2016 to review all scan results.  Patient was advised to go directly to the emergency department in the meantime if he develops any worsening symptoms whatsoever.

## 2016-03-18 NOTE — Progress Notes (Signed)
Michel Harrow, NP came to see pt in infusion room while running IVF's. Rechecked BP and is stable in 120's. Pt notified that he will have CT and that they are ready for him now. Pt wheeled to CT with IVF's running. Pt to go to CT with oral contrast.

## 2016-03-18 NOTE — Assessment & Plan Note (Signed)
Blood pressure on initial check was 76/48.  Patient feels dehydrated today; and is complaining of increased fatigue and weakness as well.  Confirmed that patient is not taking any blood pressure medications or diuretics.  Patient appears mildly dehydrated today; will receive IV fluid rehydration.  Following IV fluids-patient's blood pressure improved to 127/54.  Patient was advised to continue pushing fluids at home is much as possible.

## 2016-03-18 NOTE — Patient Instructions (Signed)

## 2016-03-18 NOTE — Telephone Encounter (Signed)
spoke w/ pt confirmed added apt @ 11 today

## 2016-03-18 NOTE — Progress Notes (Signed)
Patient to CT @ 1330 for oral contrast.  Patient complete IVFs in CT at 1500.  Patient discharged home by CT nurse.

## 2016-03-19 ENCOUNTER — Telehealth: Payer: Self-pay | Admitting: Oncology

## 2016-03-19 ENCOUNTER — Telehealth: Payer: Self-pay | Admitting: Nurse Practitioner

## 2016-03-19 NOTE — Telephone Encounter (Signed)
left msg with wife confirming  6/1 apt

## 2016-03-19 NOTE — Telephone Encounter (Signed)
Called and reviewed.  Scan results with the patient.  Advised patient that Dr. Benay Spice has suggested that patient consider an upper endoscopy for further evaluation.  Also offered for patient to come into the cancer Center this afternoon.  See Dr. Namon Cirri more thoroughly reviewed his scan results.  Patient stated that he was home alone right now; and he preferred to wait until next week to review the scan results in detail.  At the cancer center.  Also, he stated that he did not think he wanted to undergo an upper endoscopy; but that he would continue to think about it.  Patient also requested that this provider called his wife to review results as well.  This provider then call patient's wife; and reviewed all scan results in detail as well.  She was comfortable with the plan to have the schedulers arrange for a follow-up appointment with Dr. Benay Spice to review scan results sometime next week.  Patient and his wife both state understanding that they would go directly to the emergency department for any worsening symptoms whatsoever.

## 2016-03-25 ENCOUNTER — Telehealth: Payer: Self-pay | Admitting: Oncology

## 2016-03-25 ENCOUNTER — Other Ambulatory Visit: Payer: Self-pay | Admitting: *Deleted

## 2016-03-25 ENCOUNTER — Ambulatory Visit (HOSPITAL_BASED_OUTPATIENT_CLINIC_OR_DEPARTMENT_OTHER): Payer: Medicare Other | Admitting: Oncology

## 2016-03-25 VITALS — BP 112/62 | HR 83 | Temp 98.6°F | Resp 18 | Ht 66.0 in | Wt 106.3 lb

## 2016-03-25 DIAGNOSIS — R1319 Other dysphagia: Secondary | ICD-10-CM

## 2016-03-25 DIAGNOSIS — R109 Unspecified abdominal pain: Secondary | ICD-10-CM

## 2016-03-25 DIAGNOSIS — C169 Malignant neoplasm of stomach, unspecified: Secondary | ICD-10-CM | POA: Diagnosis not present

## 2016-03-25 DIAGNOSIS — C162 Malignant neoplasm of body of stomach: Secondary | ICD-10-CM

## 2016-03-25 NOTE — Progress Notes (Signed)
  [place] OFFICE PROGRESS NOTE   Diagnosis: Gastric cancer  INTERVAL HISTORY:   Danny Page returns prior to a scheduled visit. He reports intermittent left upper abdomen pain for the past few weeks. No pain today. No nausea or vomiting. He has intermittent solid dysphagia.  He reports a good appetite.    Objective:  Vital signs in last 24 hours:  Blood pressure 112/62, pulse 83, temperature 98.6 F (37 C), temperature source Oral, resp. rate 18, height 5\' 6"  (1.676 m), weight 106 lb 4.8 oz (48.217 kg), SpO2 97 %.    HEENT: Neck without mass Resp: Lungs clear bilaterally with distant breath sounds Cardio: Regular rate and rhythm GI: No hepatomegaly, nontender, no mass Vascular: Leg edema   Lab Results:  Lab Results  Component Value Date   WBC 7.9 03/18/2016   HGB 11.1* 03/18/2016   HCT 35.4* 03/18/2016   MCV 80.2 03/18/2016   PLT 266 03/18/2016   NEUTROABS 5.5 03/18/2016    Medications: I have reviewed the patient's current medications.  Assessment/Plan: 1. Adenocarcinoma of the stomach. Adenocarcinoma found in the gastric remnant status post previous partial gastrectomy/Billroth II anastomosis. No evidence of metastatic disease by physical exam or CT of the abdomen/pelvis. Staging EUS on 03/30/2012 confirmed a uT3 uN0 tumor. He began radiation and concurrent Xeloda chemotherapy on 04/18/2012. Xeloda was subsequently placed on hold due to chest pain. Xeloda was resumed on 04/26/2012 with no further chest pain. The Xeloda dose was titrated to 1000 mg twice daily beginning 04/28/2012. He completed treatment on 05/30/2012. Restaging CT on 08/01/2012 with no evidence of metastatic disease. -Upper endoscopy 05/08/2013 with abnormal mucosa at the lesser curvature and no tumor mass seen, biopsy negative for malignancy  -CT 11/26/2015 revealed increased gastric wall thickening and thickening at the anterior bladder -CT 03/18/2016 revealed stable gastric wall  thickening, increased size of anterior bladder wall lesion 2. Remote partial gastrectomy/Billroth II anastomosis secondary to ulcer disease. 3. Iron deficiency anemia secondary to the gastric mass status post red cell transfusion 03/17/2012.  4. COPD. 5. History of colon polyps. 6. History of Chest pain/subxiphoid pain-most likely related to radiation esophagitis/gastritis or the gastric mass 7. Anorexia/weight loss-? Secondary to gastric cancer or COPD, depression 8. Depression-he started a trial of Remeron on 06/13/2012, no longer taking Remeron 9. Dyspnea/low-grade fever at an office visit on 06/20/2012-? COPD flare,? Radiation pneumonitis-he completed a trial of prednisone. The symptoms partially improved. 10. History of Orthostasis 11. History of Dysphagia-none today 12. Indeterminate 8 mm left liver lesion on the CT 08/01/2012.   Disposition:  Danny Page appears unchanged. He has developed intermittent left abdomen pain and solid dysphagia for the past several weeks. The discomfort may be related to progression of tumor in the stomach. He could have a radiation stricture.  He would like to proceed with a restaging endoscopy. He says that he will agree to consider treatment if progressive tumor is confirmed.  I reviewed the 03/18/2016 CT images with Danny Page and his wife. He does not wish to undergo evaluation of the bladder lesion.  I will refer him to Dr. Hilarie Fredrickson and he will return for an office visit in 3 weeks.  [name], MD  03/25/2016  11:12 AM

## 2016-03-25 NOTE — Progress Notes (Signed)
Pt given prescription for rolling walker per Dr. Benay Spice.

## 2016-03-25 NOTE — Telephone Encounter (Signed)
Gave pt apt & avs.  Scheduled a f/u visit @ [facility] per referral.

## 2016-03-26 ENCOUNTER — Telehealth: Payer: Self-pay | Admitting: *Deleted

## 2016-03-26 ENCOUNTER — Telehealth: Payer: Self-pay

## 2016-03-26 ENCOUNTER — Other Ambulatory Visit: Payer: Self-pay | Admitting: *Deleted

## 2016-03-26 DIAGNOSIS — C169 Malignant neoplasm of stomach, unspecified: Secondary | ICD-10-CM

## 2016-03-26 MED ORDER — BUDESONIDE 0.25 MG/2ML IN SUSP: 0.2500 mg | Freq: Two times a day (BID) | RESPIRATORY_TRACT | Status: DC

## 2016-03-26 MED ORDER — ALBUTEROL SULFATE 1.25 MG/3ML IN NEBU: 1.0000 | INHALATION_SOLUTION | Freq: Two times a day (BID) | RESPIRATORY_TRACT | Status: DC

## 2016-03-26 NOTE — Telephone Encounter (Signed)
===  View-only below this line===  ----- Message -----    From: Jerene Bears, MD    Sent: 03/26/2016  11:56 AM      To: Larina Bras, CMA  EGD is okay  ----- Message -----    From: Larina Bras, CMA    Sent: 03/26/2016  10:28 AM      To: Jerene Bears, MD  He is scheduled to see Janett Billow on 04/09/16 in the office (referred by cancer center). Do you want me to cancel that appointment and schedule EGD or hold off on scheduling EGD and let him see Jess first?

## 2016-03-26 NOTE — Telephone Encounter (Signed)
Patient has been scheduled at [facility] for endoscopy (on o2, severe calorie malnutrition) on 04/14/16 @ 9 am. Is is scheduled for previsit on 03/31/16 @ 10:30 am. Patient has been advised of time/date/location of these appointments. He is also advised that his appointment with Alonza Bogus, PA-C on 04/09/16 has been cancelled as per Dr Vena Rua recommendations. He verbalizes understanding of all of the above.

## 2016-03-26 NOTE — Telephone Encounter (Signed)
Pt request refill albuterol neb solution and budesonide neb solution to [facility] [address]. Pt last seen 01/15/16; refill done per protocol. Pt will ck with pharmacy.

## 2016-03-31 ENCOUNTER — Ambulatory Visit (AMBULATORY_SURGERY_CENTER): Payer: Self-pay

## 2016-03-31 VITALS — Ht 66.0 in | Wt 104.8 lb

## 2016-03-31 DIAGNOSIS — C162 Malignant neoplasm of body of stomach: Secondary | ICD-10-CM

## 2016-03-31 NOTE — Progress Notes (Signed)
No allergies to eggs or soy No past problems with anesthesia USES OXYGEN AT HOME "24/7" BUT DOES NOT HAVE IT ON DURING PREVISIT; USES "[employer]" No diet meds  Spouse has internet but does not want anything sent; "I had rather people just to call"

## 2016-04-08 ENCOUNTER — Encounter: Payer: Self-pay | Admitting: Internal Medicine

## 2016-04-08 ENCOUNTER — Ambulatory Visit: Payer: Medicare Other | Admitting: Internal Medicine

## 2016-04-08 VITALS — BP 116/58 | HR 72 | Resp 24

## 2016-04-08 DIAGNOSIS — C169 Malignant neoplasm of stomach, unspecified: Secondary | ICD-10-CM | POA: Diagnosis not present

## 2016-04-08 DIAGNOSIS — J439 Emphysema, unspecified: Secondary | ICD-10-CM | POA: Diagnosis not present

## 2016-04-08 DIAGNOSIS — E441 Mild protein-calorie malnutrition: Secondary | ICD-10-CM

## 2016-04-08 DIAGNOSIS — J9611 Chronic respiratory failure with hypoxia: Secondary | ICD-10-CM

## 2016-04-08 DIAGNOSIS — F339 Major depressive disorder, recurrent, unspecified: Secondary | ICD-10-CM

## 2016-04-08 DIAGNOSIS — R9341 Abnormal radiologic findings on diagnostic imaging of renal pelvis, ureter, or bladder: Secondary | ICD-10-CM

## 2016-04-08 NOTE — Assessment & Plan Note (Signed)
He is now willing to see urologist to evaluate this lesion Consult placed

## 2016-04-08 NOTE — Assessment & Plan Note (Signed)
Weight stable Won't take supplements

## 2016-04-08 NOTE — Progress Notes (Signed)
Subjective:    Patient ID: Danny Page, male    DOB: 1935-07-22, 81 y.o.   MRN: [id number]  HPI Home visit for follow up of chronic health issues Not doing well according to wife  Feels tired all the time Unhappy about the need for oxygen---till resists using the oxygen all the time Exhausted after the shower Breathing bad at times--he feels the oxygen helps  Has appt for EGD by Dr Hilarie Fredrickson next week Then has follow up with Dr Benay Spice Looking for recurrence of the gastric cancer Bladder abnormality on CT has worsened--- highly suggestive of cancer He now states he wants to look into all this (consult placed) Will accept therapy if gastric cancer has recurred  Still depressed Hard for him to be dependent and need the oxygen Limited activity due to COPD Enjoys TV Wife needs to "beg him" to go out to store  Current Outpatient Prescriptions on File Prior to Visit  Medication Sig Dispense Refill  . acetaminophen (TYLENOL) 325 MG tablet Take 2 tablets (650 mg total) by mouth every 6 (six) hours as needed for mild pain (or Fever >/= 101).    Marland Kitchen albuterol (ACCUNEB) 1.25 MG/3ML nebulizer solution Take 3 mLs (1.25 mg total) by nebulization 2 (two) times daily. With budesonide and bid prn for SOB 75 mL 2  . albuterol (PROVENTIL HFA;VENTOLIN HFA) 108 (90 Base) MCG/ACT inhaler Inhale 2 puffs into the lungs every 6 (six) hours as needed for wheezing or shortness of breath. 1 Inhaler 1  . aspirin 325 MG EC tablet Take 325 mg by mouth daily.    . budesonide (PULMICORT) 0.25 MG/2ML nebulizer solution Take 2 mLs (0.25 mg total) by nebulization 2 (two) times daily. 60 mL 0  . ferrous sulfate 325 (65 FE) MG tablet Take 650 mg by mouth daily with breakfast.     . tiotropium (SPIRIVA) 18 MCG inhalation capsule Place 18 mcg into inhaler and inhale daily.     No current facility-administered medications on file prior to visit.    No Known Allergies  Past Medical History  Diagnosis Date  . COPD  (chronic obstructive pulmonary disease) ([place])   . Post herpetic neuralgia   . Hemorrhoids   . Anemia   . Stomach cancer ([place])     remission  . CAD (coronary artery disease) 9/14    mildly abnormal perfusion study  . BPH (benign prostatic hypertrophy)   . Muscle disease     pt denies    Past Surgical History  Procedure Laterality Date  . Stomach surgery      for ulcers, age 13  . Eus  03/30/2012    Procedure: UPPER ENDOSCOPIC ULTRASOUND (EUS) LINEAR;  Surgeon: Milus Banister, MD;  Location: [facility];  Service: Endoscopy;  Laterality: N/A;  . Cataract extraction w/ intraocular lens implant Right 2016    Family History  Problem Relation Age of Onset  . Heart disease Brother   . Cancer Mother 1    breast ca with mets to lymph nodes  . Colon cancer Neg Hx     Social History   Social History  . Marital Status: Married    Spouse Name: N/A  . Number of Children: N/A  . Years of Education: N/A   Occupational History  . retired, Chief Financial Officer    Social History Main Topics  . Smoking status: Former Smoker -- 45 years    Types: Cigarettes    Quit date: 10/26/2003  . Smokeless tobacco: Never Used  .  Alcohol Use: No     Comment: quit about 2004, heavy in the past  . Drug Use: No  . Sexual Activity: No   Other Topics Concern  . Not on file   Social History Narrative   No living will   Requests wife as health care POA--no set form   Requests DNR--- done 01/08/15   Would not want a feeding tube   Review of Systems  Eating some--weight is stable. Doesn't like supplements Sleeps okay Bowels are okay No skin problems     Objective:   Physical Exam  Constitutional: He appears well-developed. No distress.  Neck: No thyromegaly present.  Cardiovascular: Normal rate, regular rhythm and normal heart sounds.  Exam reveals no gallop.   No murmur heard. Distant heart sounds  Pulmonary/Chest: No respiratory distress. He has no wheezes. He has no rales.  Sig decreased  breath sounds--but clear  Abdominal: Soft. He exhibits no distension. There is no tenderness. There is no rebound and no guarding.  Musculoskeletal: He exhibits no edema.  Lymphadenopathy:    He has no cervical adenopathy.  Psychiatric:  Chronic depression/psychomotor retardation          Assessment & Plan:

## 2016-04-08 NOTE — Assessment & Plan Note (Signed)
Stable Has failed meds in past Worse now due to physical limitations

## 2016-04-08 NOTE — Assessment & Plan Note (Signed)
Pretty much on oxygen all the time (will occ take off to go to bathroom)

## 2016-04-08 NOTE — Assessment & Plan Note (Signed)
Getting EGD next week Will defer to Dr Benay Spice if recurrence---surgery not a good idea though

## 2016-04-08 NOTE — Assessment & Plan Note (Signed)
Fairly severe but stable on current regimen This is a relative contraindication to any surgery (especially if intraperitoneal)

## 2016-04-09 ENCOUNTER — Ambulatory Visit: Payer: Medicare Other | Admitting: Gastroenterology

## 2016-04-09 ENCOUNTER — Telehealth: Payer: Self-pay | Admitting: *Deleted

## 2016-04-09 DIAGNOSIS — J449 Chronic obstructive pulmonary disease, unspecified: Secondary | ICD-10-CM | POA: Diagnosis not present

## 2016-04-09 NOTE — Telephone Encounter (Signed)
Call from pt's wife asking to clarify schedule. Does he need lab 6/20? They received reminder call for this appt. Wife reports she contacted scheduling- they were unable to clarify. Pt is scheduled to see Dr. Hilarie Fredrickson 6/21. Instructed Mrs. Ratay to keep appt 6/27. 6/20 appts canceled. No labs needed, per Dr. Benay Spice.

## 2016-04-10 DIAGNOSIS — J449 Chronic obstructive pulmonary disease, unspecified: Secondary | ICD-10-CM | POA: Diagnosis not present

## 2016-04-12 ENCOUNTER — Encounter (HOSPITAL_COMMUNITY): Payer: Self-pay | Admitting: *Deleted

## 2016-04-12 NOTE — Progress Notes (Signed)
Pt SDW-Pre-op call completed by pt spouse , Jacklyn Shell ( legal guardian). Spouse denies pt C/O any acute cardiopulmonary issues. Spouse denies pt had a cardiac cath. Spouse made aware for pt to stop vitamins and herbal medications. Spouse verbalized understanding of all pre-op instructions.

## 2016-04-13 ENCOUNTER — Ambulatory Visit: Payer: Medicare Other | Admitting: Oncology

## 2016-04-13 ENCOUNTER — Encounter (HOSPITAL_COMMUNITY): Payer: Self-pay | Admitting: Anesthesiology

## 2016-04-13 ENCOUNTER — Other Ambulatory Visit: Payer: Medicare Other

## 2016-04-14 ENCOUNTER — Encounter (HOSPITAL_COMMUNITY): Payer: Self-pay | Admitting: *Deleted

## 2016-04-14 ENCOUNTER — Ambulatory Visit (HOSPITAL_COMMUNITY)
Admission: RE | Admit: 2016-04-14 | Discharge: 2016-04-14 | Disposition: A | Discharge status: Home / Self Care | Payer: Medicare Other | Source: Ambulatory Visit | Attending: Internal Medicine | Admitting: Internal Medicine

## 2016-04-14 ENCOUNTER — Ambulatory Visit (HOSPITAL_COMMUNITY): Payer: Medicare Other | Admitting: Anesthesiology

## 2016-04-14 ENCOUNTER — Other Ambulatory Visit: Payer: Self-pay | Admitting: Internal Medicine

## 2016-04-14 ENCOUNTER — Encounter (HOSPITAL_COMMUNITY)
Admission: RE | Disposition: A | Discharge status: Home / Self Care | Payer: Self-pay | Source: Ambulatory Visit | Attending: Internal Medicine

## 2016-04-14 DIAGNOSIS — C169 Malignant neoplasm of stomach, unspecified: Secondary | ICD-10-CM | POA: Diagnosis not present

## 2016-04-14 DIAGNOSIS — I2510 Atherosclerotic heart disease of native coronary artery without angina pectoris: Secondary | ICD-10-CM | POA: Insufficient documentation

## 2016-04-14 DIAGNOSIS — C160 Malignant neoplasm of cardia: Secondary | ICD-10-CM

## 2016-04-14 DIAGNOSIS — K228 Other specified diseases of esophagus: Secondary | ICD-10-CM

## 2016-04-14 DIAGNOSIS — Z87891 Personal history of nicotine dependence: Secondary | ICD-10-CM | POA: Diagnosis not present

## 2016-04-14 DIAGNOSIS — R1012 Left upper quadrant pain: Secondary | ICD-10-CM | POA: Diagnosis present

## 2016-04-14 DIAGNOSIS — J449 Chronic obstructive pulmonary disease, unspecified: Secondary | ICD-10-CM | POA: Insufficient documentation

## 2016-04-14 DIAGNOSIS — Z980 Intestinal bypass and anastomosis status: Secondary | ICD-10-CM | POA: Insufficient documentation

## 2016-04-14 DIAGNOSIS — N400 Enlarged prostate without lower urinary tract symptoms: Secondary | ICD-10-CM | POA: Insufficient documentation

## 2016-04-14 HISTORY — PX: ESOPHAGOGASTRODUODENOSCOPY (EGD) WITH PROPOFOL: SHX5813

## 2016-04-14 HISTORY — DX: Unspecified hearing loss, unspecified ear: H91.90

## 2016-04-14 LAB — POCT I-STAT, CHEM 8
BUN: 12 mg/dL (ref 6–20)
CALCIUM ION: 1.19 mmol/L (ref 1.13–1.30)
Chloride: 100 mmol/L — ABNORMAL LOW (ref 101–111)
Creatinine, Ser: 0.8 mg/dL (ref 0.61–1.24)
Glucose, Bld: 93 mg/dL (ref 65–99)
HCT: 37 % — ABNORMAL LOW (ref 39.0–52.0)
Hemoglobin: 12.6 g/dL — ABNORMAL LOW (ref 13.0–17.0)
Potassium: 4 mmol/L (ref 3.5–5.1)
SODIUM: 141 mmol/L (ref 135–145)
TCO2: 29 mmol/L (ref 0–100)

## 2016-04-14 SURGERY — ESOPHAGOGASTRODUODENOSCOPY (EGD) WITH PROPOFOL
Anesthesia: Monitor Anesthesia Care

## 2016-04-14 MED ORDER — PROPOFOL 500 MG/50ML IV EMUL
INTRAVENOUS | Status: DC | PRN
  Administered 2016-04-14: 100 ug/kg/min via INTRAVENOUS

## 2016-04-14 MED ORDER — FENTANYL CITRATE (PF) 100 MCG/2ML IJ SOLN
INTRAMUSCULAR | Status: DC | PRN
  Administered 2016-04-14: 50 ug via INTRAVENOUS

## 2016-04-14 MED ORDER — LACTATED RINGERS IV SOLN
INTRAVENOUS | Status: DC | PRN
  Administered 2016-04-14: 09:00:00 via INTRAVENOUS

## 2016-04-14 MED ORDER — LACTATED RINGERS IV SOLN
INTRAVENOUS | Status: DC
  Administered 2016-04-14: 1000 mL via INTRAVENOUS

## 2016-04-14 MED ORDER — PANTOPRAZOLE SODIUM 40 MG PO TBEC: 40.0000 mg | DELAYED_RELEASE_TABLET | Freq: Every day | ORAL | Status: DC

## 2016-04-14 MED ORDER — BUTAMBEN-TETRACAINE-BENZOCAINE 2-2-14 % EX AERO
INHALATION_SPRAY | CUTANEOUS | Status: DC | PRN
  Administered 2016-04-14: 1 via TOPICAL

## 2016-04-14 MED ORDER — SODIUM CHLORIDE 0.9 % IV SOLN: INTRAVENOUS | Status: DC

## 2016-04-14 NOTE — Anesthesia Postprocedure Evaluation (Signed)
Anesthesia Post Note  Patient: Danny Page  Procedure(s) Performed: Procedure(s) (LRB): ESOPHAGOGASTRODUODENOSCOPY (EGD) WITH PROPOFOL (N/A)  Patient location during evaluation: Endoscopy Anesthesia Type: MAC Level of consciousness: awake and alert Pain management: pain level controlled Vital Signs Assessment: post-procedure vital signs reviewed and stable Respiratory status: spontaneous breathing, nonlabored ventilation, respiratory function stable and patient connected to nasal cannula oxygen Cardiovascular status: stable and blood pressure returned to baseline Anesthetic complications: no    Last Vitals:  Filed Vitals:   04/14/16 0950 04/14/16 1000  BP: 142/67 131/58  Pulse: 72 77  Temp: 36.4 C   Resp:  26    Last Pain: There were no vitals filed for this visit.               Ellamae Lybeck,JAMES TERRILL

## 2016-04-14 NOTE — Op Note (Addendum)
[facility] Patient Name: Danny Page Procedure Date : 04/14/2016 MRN: [id number] Attending MD: Jerene Bears , MD Date of Birth: [birth year] CSN: [id number] Age: 81 Admit Type: Outpatient Procedure:                Upper GI endoscopy Indications:              Abdominal pain in the left upper quadrant,                            Follow-up of malignant adenocarcinoma of the                            stomach, Early satiety Providers:                Lajuan Lines. Hilarie Fredrickson, MD, Cleda Daub, RN, Corliss Parish, Technician, Eligha Bridegroom CRNA, CRNA Referring MD:             Izola Price. Sherrill Medicines:                Monitored Anesthesia Care Complications:            No immediate complications. Estimated Blood Loss:     Estimated blood loss was minimal. Procedure:                Pre-Anesthesia Assessment:                           - Prior to the procedure, a History and Physical                            was performed, and patient medications and                            allergies were reviewed. The patient's tolerance of                            previous anesthesia was also reviewed. The risks                            and benefits of the procedure and the sedation                            options and risks were discussed with the patient.                            All questions were answered, and informed consent                            was obtained. Prior Anticoagulants: The patient has                            taken no previous anticoagulant or antiplatelet  agents. ASA Grade Assessment: III - A patient with                            severe systemic disease. After reviewing the risks                            and benefits, the patient was deemed in                            satisfactory condition to undergo the procedure.                           After obtaining informed consent, the endoscope was                  passed under direct vision. Throughout the                            procedure, the patient's blood pressure, pulse, and                            oxygen saturations were monitored continuously. The                            [id number]) scope was introduced through the                            mouth, and advanced to the second part of duodenum.                            The upper GI endoscopy was accomplished without                            difficulty. The patient tolerated the procedure                            well. Scope In: Scope Out: Findings:      The esophageal anastomosis was normal.      The Z-line was variable and was found 44 cm from the incisors.      Retained fluid was found in the gastric body and seen immediately upon       entering the stomach. This fluid was cleared by aspiration through the       scope.      Recurrent, fungating, non-circumferential mass with oozing bleeding was       found in the cardia and in the gastric fundus. The mass is extremely       friable and involves the proximal stomach and extends into the gastric       body. Multiple biopsies were obtained with cold forceps for histology in       a targeted manner in the cardia and in the gastric fundus.      Evidence of a patent Billroth II gastrojejunostomy was found. The       gastrojejunal anastomosis was characterized by edema and erythema. There       was nodularity near the anastomosis which was biopsied with cold  forceps. This was traversed. The efferent limb was examined and the       small bowel mucosa appeared normal.      The examined small bowel was normal. Impression:               - Normal esophageal anastomosis.                           - Z-line variable, 44 cm from the incisors.                           - Retained gastric fluid.                           - Malignant gastric tumor in the cardia and in the                            gastric fundus.  Biopsied.                           - Patent Billroth II gastrojejunostomy was found,                            characterized by edema and erythema. Biopsied.                           - Normal examined small bowel. Moderate Sedation:      N/A Recommendation:           - Patient has a contact number available for                            emergencies. The signs and symptoms of potential                            delayed complications were discussed with the                            patient. Return to normal activities tomorrow.                            Written discharge instructions were provided to the                            patient.                           - Soft diet.                           - Await pathology results.                           - No aspirin, ibuprofen, naproxen, or other                            non-steroidal anti-inflammatory drugs.                           -  Continue present medications.                           - Return to Dr. Benay Spice to discuss treatment                            options. Procedure Code(s):        --- Professional ---                           [phone], Esophagogastroduodenoscopy, flexible,                            transoral; with biopsy, single or multiple Diagnosis Code(s):        --- Professional ---                           K22.8, Other specified diseases of esophagus                           C16.0, Malignant neoplasm of cardia                           C16.1, Malignant neoplasm of fundus of stomach                           Z98.0, Intestinal bypass and anastomosis status                           R10.12, Left upper quadrant pain                           R68.81, Early satiety CPT copyright 2016 American Medical Association. All rights reserved. The codes documented in this report are preliminary and upon coder review may  be revised to meet current compliance requirements. Jerene Bears, MD 04/14/2016 9:54:25 AM This  report has been signed electronically. Number of Addenda: 0

## 2016-04-14 NOTE — Anesthesia Preprocedure Evaluation (Addendum)
Anesthesia Evaluation    Airway Mallampati: II       Dental   Pulmonary former smoker,  Advanced COPD, SOB, Home O2 use   breath sounds clear to auscultation       Cardiovascular + CAD   Rhythm:Regular Rate:Normal     Neuro/Psych    GI/Hepatic R/O gastric ca, malnourished   Endo/Other    Renal/GU      Musculoskeletal   Abdominal   Peds  Hematology   Anesthesia Other Findings   Reproductive/Obstetrics                            Anesthesia Physical Anesthesia Plan  ASA: IV  Anesthesia Plan: MAC   Post-op Pain Management:    Induction: Intravenous  Airway Management Planned: Natural Airway and Nasal Cannula  Additional Equipment:   Intra-op Plan:   Post-operative Plan: Possible Post-op intubation/ventilation  Informed Consent: I have reviewed the patients History and Physical, chart, labs and discussed the procedure including the risks, benefits and alternatives for the proposed anesthesia with the patient or authorized representative who has indicated his/her understanding and acceptance.     Plan Discussed with: CRNA  Anesthesia Plan Comments:         Anesthesia Quick Evaluation

## 2016-04-14 NOTE — H&P (Signed)
HPI: [name] is a 81 yo male with hx of stomach cancer s/p partial gastrectomy/Billroth II diagnosed in 2013.  He follows with Dr. Benay Spice and was last seen in cancer clinic on 03/25/16.  At that appointment he reported intermittent left upper abdominal pain and early satiety. Food makes the pain worse. The pain is intermittent but when present can be severe. Nonradiating.Marland Kitchen He had a CT scan on 03/18/2016 showing stable gastric wall thickening. The decision was made to proceed with restaging endoscopy to evaluate for tumor progression.   Past Medical History  Diagnosis Date  . COPD (chronic obstructive pulmonary disease) ([place])   . Post herpetic neuralgia   . Hemorrhoids   . Anemia   . Stomach cancer ([place])     remission  . CAD (coronary artery disease) 9/14    mildly abnormal perfusion study  . BPH (benign prostatic hypertrophy)   . Muscle disease     pt denies  . HOH (hard of hearing)     Past Surgical History  Procedure Laterality Date  . Stomach surgery      for ulcers, age 68  . Eus  03/30/2012    Procedure: UPPER ENDOSCOPIC ULTRASOUND (EUS) LINEAR;  Surgeon: Milus Banister, MD;  Location: [facility];  Service: Endoscopy;  Laterality: N/A;  . Cataract extraction w/ intraocular lens implant Right 2016     (Not in an outpatient encounter)  No Known Allergies  Family History  Problem Relation Age of Onset  . Heart disease Brother   . Cancer Mother 62    breast ca with mets to lymph nodes  . Colon cancer Neg Hx     Social History  Substance Use Topics  . Smoking status: Former Smoker -- 45 years    Types: Cigarettes    Quit date: 10/26/2003  . Smokeless tobacco: Never Used  . Alcohol Use: No     Comment: quit about 2004, heavy in the past    ROS: As per history of present illness, otherwise negative  BP 131/78 mmHg  Pulse 76  Temp(Src) 97.7 F (36.5 C) (Oral)  Resp 20  SpO2 99% Gen: awake, alert, NAD HEENT: anicteric, op clear CV: RRR, no mrg Pulm:  distant BS, clear Abd: soft, NT/ND, +BS throughout Ext: no c/c/e Neuro: nonfocal  RELEVANT LABS AND IMAGING: CBC    Component Value Date/Time   WBC 7.9 03/18/2016 1314   WBC 13.2* 01/07/2016 0437   WBC 6.9 04/20/2012 1729   RBC 4.42 03/18/2016 1314   RBC 4.32* 01/07/2016 0437   RBC 4.32* 04/20/2012 1729   HGB 12.6* 04/14/2016 0839   HGB 11.1* 03/18/2016 1314   HGB 10.3* 04/20/2012 1729   HCT 37.0* 04/14/2016 0839   HCT 35.4* 03/18/2016 1314   HCT 32.7* 04/20/2012 1729   PLT 266 03/18/2016 1314   PLT 206 01/07/2016 0437   PLT 234 04/20/2012 1729   MCV 80.2 03/18/2016 1314   MCV 75.3* 01/07/2016 0437   MCV 76* 04/20/2012 1729   MCH 25.2* 03/18/2016 1314   MCH 24.1* 01/07/2016 0437   MCH 23.9* 04/20/2012 1729   MCHC 31.4* 03/18/2016 1314   MCHC 32.0 01/07/2016 0437   MCHC 31.5* 04/20/2012 1729   RDW 18.5* 03/18/2016 1314   RDW 20.9* 01/07/2016 0437   RDW 21.3* 04/20/2012 1729   LYMPHSABS 1.8 03/18/2016 1314   LYMPHSABS 0.9* 01/05/2016 1149   MONOABS 0.6 03/18/2016 1314   MONOABS 0.5 01/05/2016 1149   EOSABS 0.1 03/18/2016 1314  EOSABS 0.0 01/05/2016 1149   BASOSABS 0.0 03/18/2016 1314   BASOSABS 0.0 01/05/2016 1149    CMP     Component Value Date/Time   NA 141 04/14/2016 0839   NA 138 03/18/2016 1314   NA 140 04/20/2012 1729   K 4.0 04/14/2016 0839   K 4.8 Sl. Hemolysis 03/18/2016 1314   K 4.2 04/20/2012 1729   CL 100* 04/14/2016 0839   CL 105 07/18/2012 1520   CL 104 04/20/2012 1729   CO2 25 03/18/2016 1314   CO2 28 01/07/2016 0437   CO2 27 04/20/2012 1729   GLUCOSE 93 04/14/2016 0839   GLUCOSE 94 03/18/2016 1314   GLUCOSE 98 07/18/2012 1520   GLUCOSE 115* 04/20/2012 1729   BUN 12 04/14/2016 0839   BUN 12.6 03/18/2016 1314   BUN 13 04/20/2012 1729   CREATININE 0.80 04/14/2016 0839   CREATININE 0.9 03/18/2016 1314   CREATININE 1.32* 04/20/2012 1729   CALCIUM 8.9 03/18/2016 1314   CALCIUM 8.2* 01/07/2016 0437   CALCIUM 9.1 04/20/2012 1729   PROT  6.7 03/18/2016 1314   PROT 8.5* 01/05/2016 1149   PROT 7.3 04/20/2012 1729   ALBUMIN 3.5 03/18/2016 1314   ALBUMIN 4.2 01/05/2016 1149   ALBUMIN 3.8 04/20/2012 1729   AST 19 03/18/2016 1314   AST 33 01/05/2016 1149   AST 19 04/20/2012 1729   ALT <9 03/18/2016 1314   ALT 12* 01/05/2016 1149   ALT 16 04/20/2012 1729   ALKPHOS 91 03/18/2016 1314   ALKPHOS 119 01/05/2016 1149   ALKPHOS 107 04/20/2012 1729   BILITOT 0.34 03/18/2016 1314   BILITOT 0.6 01/05/2016 1149   BILITOT 0.3 04/20/2012 1729   GFRNONAA >60 01/07/2016 0437   GFRNONAA 53* 04/20/2012 1729   GFRAA >60 01/07/2016 0437   GFRAA >60 04/20/2012 1729    ASSESSMENT/PLAN:  81 yo male with hx of stomach cancer s/p partial gastrectomy/Billroth II followed by Dr. Benay Spice now with left upper quadrant pain and intermittent dysphagia  1. Hx of gastric cancer with LUQ pain and early satiety -- repeat EGD today with monitored anesthesia care.  Possible dilation. The nature of the procedure, as well as the risks, benefits, and alternatives were carefully and thoroughly reviewed with the patient. Ample time for discussion and questions allowed. The patient understood, was satisfied, and agreed to proceed.

## 2016-04-14 NOTE — Discharge Instructions (Signed)
Esophagogastroduodenoscopy, Care After Refer to this sheet in the next few weeks. These instructions provide you with information about caring for yourself after your procedure. Your health care provider may also give you more specific instructions. Your treatment has been planned according to current medical practices, but problems sometimes occur. Call your health care provider if you have any problems or questions after your procedure. WHAT TO EXPECT AFTER THE PROCEDURE After your procedure, it is typical to feel: 1. Soreness in your throat. 2. Pain with swallowing. 3. Sick to your stomach (nauseous). 4. Bloated. 5. Dizzy. 6. Fatigued. HOME CARE INSTRUCTIONS  Do not eat or drink anything until the numbing medicine (local anesthetic) has worn off and your gag reflex has returned. You will know that the local anesthetic has worn off when you can swallow comfortably.  Do not drive or operate machinery until directed by your health care provider.  Take medicines only as directed by your health care provider. SEEK MEDICAL CARE IF:  1. You cannot stop coughing. 2. You are not urinating at all or less than usual. SEEK IMMEDIATE MEDICAL CARE IF:  You have difficulty swallowing.  You cannot eat or drink.  You have worsening throat or chest pain.  You have dizziness or lightheadedness or you faint.  You have nausea or vomiting.  You have chills.  You have a fever.  You have severe abdominal pain.  You have black, tarry, or bloody stools.   This information is not intended to replace advice given to you by your health care provider. Make sure you discuss any questions you have with your health care provider.   Document Released: 09/27/2012 Document Revised: 11/01/2014 Document Reviewed: 09/27/2012 Elsevier Interactive Patient Education 2016 [place] HAD AN ENDOSCOPIC PROCEDURE TODAY: Refer to the procedure report that was given to you for any specific questions about  what was found during the examination.  If the procedure report does not answer your questions, please call your gastroenterologist to clarify.  YOU SHOULD EXPECT: Some feelings of bloating in the abdomen. Passage of more gas than usual.  Walking can help get rid of the air that was put into your GI tract during the procedure and reduce the bloating. If you had a lower endoscopy (such as a colonoscopy or flexible sigmoidoscopy) you may notice spotting of blood in your stool or on the toilet paper.   DIET: Your first meal following the procedure should be a light meal and then it is ok to progress to your normal diet.  A half-sandwich or bowl of soup is an example of a good first meal.  Heavy or fried foods are harder to digest and may make you feel nasueas or bloated.  Drink plenty of fluids but you should avoid alcoholic beverages for 24 hours.  ACTIVITY: Your care partner should take you home directly after the procedure.  You should plan to take it easy, moving slowly for the rest of the day.  You can resume normal activity the day after the procedure however you should NOT DRIVE or use heavy machinery for 24 hours (because of the sedation medicines used during the test).    SYMPTOMS TO REPORT IMMEDIATELY  A gastroenterologist can be reached at any hour.  Please call your doctor's office for any of the following symptoms:   Following lower endoscopy (colonoscopy, flexible sigmoidoscopy)  Excessive amounts of blood in the stool  Significant tenderness, worsening of abdominal pains  Swelling of the abdomen that is  new, acute  Fever of 100 or higher  Following upper endoscopy (EGD, EUS, ERCP)  Vomiting of blood or coffee ground material  New, significant abdominal pain  New, significant chest pain or pain under the shoulder blades  Painful or persistently difficult swallowing  New shortness of breath  Black, tarry-looking stools  FOLLOW UP: If any biopsies were taken you will be contacted  by phone or by letter within the next 1-3 weeks.  Call your gastroenterologist if you have not heard about the biopsies in 3 weeks.  Please also call your gastroenterologist's office with any specific questions about appointments or follow up tests.

## 2016-04-14 NOTE — Transfer of Care (Signed)
Immediate Anesthesia Transfer of Care Note  Patient: Danny Page  Procedure(s) Performed: Procedure(s): ESOPHAGOGASTRODUODENOSCOPY (EGD) WITH PROPOFOL (N/A)  Patient Location: PACU  Anesthesia Type:MAC  Level of Consciousness: awake, alert  and oriented  Airway & Oxygen Therapy: Patient Spontanous Breathing and Patient connected to nasal cannula oxygen  Post-op Assessment: Report given to RN and Post -op Vital signs reviewed and stable  Post vital signs: Reviewed and stable  Last Vitals:  Filed Vitals:   04/14/16 1000 04/14/16 1010  BP: 131/58 149/78  Pulse: 77 76  Temp:    Resp: 26 22    Last Pain: There were no vitals filed for this visit.       Complications: No apparent anesthesia complications

## 2016-04-15 ENCOUNTER — Encounter (HOSPITAL_COMMUNITY): Payer: Self-pay | Admitting: Internal Medicine

## 2016-04-16 ENCOUNTER — Telehealth: Payer: Self-pay | Admitting: Internal Medicine

## 2016-04-16 NOTE — Telephone Encounter (Signed)
Dr. Vena Rua note regarding path discussed with pts wife and she is aware, questions answered.

## 2016-04-20 ENCOUNTER — Encounter: Payer: Self-pay | Admitting: *Deleted

## 2016-04-20 ENCOUNTER — Telehealth: Payer: Self-pay | Admitting: Oncology

## 2016-04-20 ENCOUNTER — Ambulatory Visit (HOSPITAL_BASED_OUTPATIENT_CLINIC_OR_DEPARTMENT_OTHER): Payer: Medicare Other | Admitting: Oncology

## 2016-04-20 ENCOUNTER — Other Ambulatory Visit: Payer: Self-pay | Admitting: *Deleted

## 2016-04-20 VITALS — BP 85/52 | HR 72 | Temp 98.4°F | Resp 18 | Ht 66.0 in | Wt 104.8 lb

## 2016-04-20 DIAGNOSIS — C162 Malignant neoplasm of body of stomach: Secondary | ICD-10-CM

## 2016-04-20 DIAGNOSIS — N329 Bladder disorder, unspecified: Secondary | ICD-10-CM

## 2016-04-20 DIAGNOSIS — R110 Nausea: Secondary | ICD-10-CM

## 2016-04-20 DIAGNOSIS — C169 Malignant neoplasm of stomach, unspecified: Secondary | ICD-10-CM | POA: Diagnosis not present

## 2016-04-20 DIAGNOSIS — R109 Unspecified abdominal pain: Secondary | ICD-10-CM | POA: Diagnosis not present

## 2016-04-20 MED ORDER — HYDROCODONE-ACETAMINOPHEN 5-325 MG PO TABS: 1.0000 | ORAL_TABLET | ORAL | Status: AC | PRN

## 2016-04-20 NOTE — Progress Notes (Signed)
[facility] OFFICE PROGRESS NOTE   Diagnosis: Gastric cancer  INTERVAL HISTORY:   Danny Page returns as scheduled. He has intermittent postprandial abdominal pain and reports a few episodes of emesis. No dysphagia. No bleeding. He was taken to an upper endoscopy by Dr.Pyrtle on 04/14/2016. A fungating mass with oozing was found in the cardia and gastric fundus. The mass was friable. The Billroth II anastomosis was involved with nodularity. This was biopsied. The biopsy from the proximal and distal stomach returned as adenocarcinoma.   Objective:  Vital signs in last 24 hours:  Blood pressure 85/52, pulse 72, temperature 98.4 F (36.9 C), temperature source Oral, resp. rate 18, height 5\' 6"  (1.676 m), weight 104 lb 12.8 oz (47.537 kg), SpO2 96 %.    Lymphatics:  no cervical, supraclavicular, or axillary nodes Resp:  lungs clear bilaterally Cardio:  regular rate and rhythm GI:  no hepatomegaly, no mass, nontender Vascular:  no leg edema   Lab Results:  Lab Results  Component Value Date   WBC 7.9 03/18/2016   HGB 12.6* 04/14/2016   HCT 37.0* 04/14/2016   MCV 80.2 03/18/2016   PLT 266 03/18/2016   NEUTROABS 5.5 03/18/2016     Medications: I have reviewed the patient's current medications.  Assessment/Plan: 1. Adenocarcinoma of the stomach. Adenocarcinoma found in the gastric remnant status post previous partial gastrectomy/Billroth II anastomosis. No evidence of metastatic disease by physical exam or CT of the abdomen/pelvis. Staging EUS on 03/30/2012 confirmed a uT3 uN0 tumor. He began radiation and concurrent Xeloda chemotherapy on 04/18/2012. Xeloda was subsequently placed on hold due to chest pain. Xeloda was resumed on 04/26/2012 with no further chest pain. The Xeloda dose was titrated to 1000 mg twice daily beginning 04/28/2012. He completed treatment on 05/30/2012. Restaging CT on 08/01/2012 with no evidence of metastatic disease. -Upper endoscopy  05/08/2013 with abnormal mucosa at the lesser curvature and no tumor mass seen, biopsy negative for malignancy  -CT 11/26/2015 revealed increased gastric wall thickening and thickening at the anterior bladder -CT 03/18/2016 revealed stable gastric wall thickening, increased size of anterior bladder wall lesion -Upper endoscopy 04/14/2016 revealed tumor involving the gastric cardia/fundus and Billroth II anastomosis, biopsies confirmed adenocarcinoma  2. Remote partial gastrectomy/Billroth II anastomosis secondary to ulcer disease. 3. Iron deficiency anemia secondary to the gastric mass status post red cell transfusion 03/17/2012.  4. COPD. 5. History of colon polyps. 6. History of Chest pain/subxiphoid pain-most likely related to radiation esophagitis/gastritis or the gastric mass 7. Anorexia/weight loss-? Secondary to gastric cancer or COPD, depression 8. Depression-he started a trial of Remeron on 06/13/2012, no longer taking Remeron 9. Dyspnea/low-grade fever at an office visit on 06/20/2012-? COPD flare,? Radiation pneumonitis-he completed a trial of prednisone. The symptoms partially improved. 10. History of Orthostasis 11. History of Dysphagia-none today 12. Indeterminate 8 mm left liver lesion on the CT 08/01/2012.   Disposition:   [name] appears unchanged. He is symptomatic with pain and nausea secondary to local progression of gastric cancer. There is no clinical evidence of distant metastatic disease, though the bladder mass could be related to gastric cancer. He is scheduled for an evaluation in urology next week.  I discussed systemic treatment options with [name] and his family. They understand he is not a surgical candidate. I recommend supportive care versus a trial of FOLFOX.  I reviewed the potential toxicities associated with the FOLFOX regimen including the chance for nausea/vomiting, mucositis, diarrhea, alopecia, and hematologic toxicity. We discussed the rash,  hyperpigmentation, sun sensitivity, and hand/foot syndrome associated with 5 fluorouracil. We reviewed the allergic reaction and various types of neuropathy seen with oxaliplatin.  [name] indicated he does not wish to receive systemic chemotherapy as a single treatment modality. He would like to consider repeat treatment with radiation. I explained radiation is limited to a maximum cumulative dose.  I will contact Dr. Lisbeth Renshaw to see if he may be eligible for repeat radiation to the stomach. Mr. Gentleman will return for an office visit on 05/05/2016.  [name], MD  04/20/2016  11:31 AM

## 2016-04-20 NOTE — Progress Notes (Unsigned)
Patient left office before obtaining prescription for Vicodin.  Message left on pt.'s wife's voicemail to inform her that script is ready for pick up at [facility].

## 2016-04-20 NOTE — Progress Notes (Signed)
Patient and wife came into class after seeing Dr. Benay Spice for teaching of folfox.  Within a couple of minutes of being in class and showing drug profile sheets patient states he is not going to do the treatment.  He only wants to take a pill.  Info related to Dr. Benay Spice.  Patient discharged home with app't to f/u with Dr. Benay Spice in 2 weeks.

## 2016-04-20 NOTE — Telephone Encounter (Signed)
Gave pt cal & avs °

## 2016-04-23 NOTE — Progress Notes (Signed)
GI Location of Tumor / Histology: Stomach,distal and proximal   Danny Page presented  months ago with symptoms of:   Biopsies of  (if applicable) revealed: Diagnosis 04/14/16: 1. Stomach, biopsy, Distal - ADENOCARCINOMA.- SEE COMMENT. 2. Stomach, biopsy, Proximal - ADENOCARCINOMA  Past/Anticipated interventions by surgeon, if any:  04/14/16 Dr. Hilarie Fredrickson Upper Endoscopy =fungating mass with oozing was found in the cardia and gastric fundus.Billroth II anastomosis was involved with nodularity, this was biopsied  Past/Anticipated interventions by medical oncology, if any: Dr. Benay Spice 04/20/16, patient declined systemic chemotherapy  ,referral for possible radiation again    Weight changes, if any:   Bowel/Bladder complaints, if any:   Nausea / Vomiting, if any:yes  Pain issues, if any:  Yes abdomen left upper quadrant,  Any blood per rectum:     SAFETY ISSUES:  Prior radiation? Yes, gastric cancer 04/19/12-05/30/12 4500Gy / 25 fractions, Dr. Gery Pray  Pacemaker/ICD? NO  Is the patient on methotrexate? no  Current Complaints/Details:

## 2016-04-26 ENCOUNTER — Ambulatory Visit
Admission: RE | Admit: 2016-04-26 | Discharge: 2016-04-26 | Disposition: A | Discharge status: Home / Self Care | Payer: Medicare Other | Source: Ambulatory Visit | Attending: Radiation Oncology | Admitting: Radiation Oncology

## 2016-04-26 ENCOUNTER — Encounter: Payer: Self-pay | Admitting: Radiation Oncology

## 2016-04-26 VITALS — BP 83/49 | HR 85 | Temp 98.7°F | Resp 20 | Ht 66.0 in | Wt 105.8 lb

## 2016-04-26 DIAGNOSIS — Z510 Encounter for antineoplastic radiation therapy: Secondary | ICD-10-CM | POA: Insufficient documentation

## 2016-04-26 DIAGNOSIS — C160 Malignant neoplasm of cardia: Secondary | ICD-10-CM

## 2016-04-26 DIAGNOSIS — C161 Malignant neoplasm of fundus of stomach: Secondary | ICD-10-CM

## 2016-04-26 DIAGNOSIS — C162 Malignant neoplasm of body of stomach: Secondary | ICD-10-CM

## 2016-04-26 NOTE — Progress Notes (Signed)
Please see the Nurse Progress Note in the MD Initial Consult Encounter for this patient. 

## 2016-04-26 NOTE — Progress Notes (Addendum)
GI Location of Tumor / Histology: Stomach,distal and proximal   Danny Page presented  months ago with symptoms of:   Biopsies of  (if applicable) revealed: Diagnosis 04/14/16: 1. Stomach, biopsy, Distal - ADENOCARCINOMA.- SEE COMMENT. 2. Stomach, biopsy, Proximal - ADENOCARCINOMA  Past/Anticipated interventions by surgeon, if any:  04/14/16 Dr. Hilarie Fredrickson Upper Endoscopy =fungating mass with oozing was found in the cardia and gastric fundus.Billroth II anastomosis was involved with nodularity, this was biopsied  Past/Anticipated interventions by medical oncology, if any: Dr. Benay Spice 04/20/16, patient declined systemic chemotherapy  ,referral for possible radiation again    Weight changes, if any: no   Bowel/Bladder complaints, if any:  Regular bowels, no bladder problems   Nausea / Vomiting, if any:y none since last week  Can only take a couple bites at a a time, gets full and takes 2 hours to eat a meal ,   Pain issues, if any:  Yes abdomen left upper quadrant,  And lower   Any blood per rectum:    no  SAFETY ISSUES:  Prior radiation? Yes, gastric cancer 04/19/12-05/30/12 4500Gy / 25 fractions, Dr. Gery Pray  Pacemaker/ICD? NO  Is the patient on methotrexate? no  Current Complaints/Details:Married, 1 son, Mother had breast cancer,  Former smoker 20 years ago, 30 years smoking 2ppd  , quit alcohol, no illicit drug, BP 99991111 mmHg  Pulse 87  Temp(Src) 98.7 F (37.1 C) (Oral)  Resp 20  Ht 5\' 6"  (1.676 m)  Wt 105 lb 12.8 oz (47.991 kg)  BMI 17.08 kg/m2  SpO2 96% left arm sittring BP 83/49 mmHg  Pulse 85  Temp(Src) 98.7 F (37.1 C) (Oral)  Resp 20  Ht 5\' 6"  (1.676 m)  Wt 105 lb 12.8 oz (47.991 kg)  BMI 17.08 kg/m2  SpO2 96% right arm siting

## 2016-04-26 NOTE — Progress Notes (Signed)
Radiation Oncology         [phone] ________________________________  Name: Danny Page MRN: [id number]  Date: 04/26/2016  DOB: 24-May-1935  JO:ACZYSAY Danny Pate, MD  Ladell Pier, MD     REFERRING PHYSICIAN: Ladell Pier, MD   DIAGNOSIS: The encounter diagnosis was Malignant neoplasm of fundus of stomach (Danny Page).   HISTORY OF PRESENT ILLNESS: Danny Page is a 81 y.o. male seen at the request of Dr. Benay Page for a history of recurrent adenocarcinoma of the stomach. He was originally diagnosed in December 2013 with a T3 tumor after years prior he had undergone a partial gastrectomy with Billroth II anastomosis in the 1950s for ulcers. He received radiation concurrently with Xeloda and he completed his treatment between 04/18/2012 and 05/30/2012. He received a total of 50.4 Gy. Following treatment, the patient achieved remission. He is been followed in surveillance and on imaging with CT scan in February 2017 increased gastric wall thickening and thickening along the anterior bladder was identified. This was stable on another scan on 03/18/16 and nodular thickening of the adrenal glands was also noted.  He continues to experience episodes of abdominal pain in the left upper quadrant and early satiety. He did undergo endoscopy on 04/15/2016 which demonstrated a fungating circumferential mass of losing bleeding of the cardia and gastric fundus is extremely friable, is anastomosis was patent but edematous and erythematous. There is also nodularity near this was also biopsied in both specimens sites distally and proximally were involved with adenocarcinoma. He has met with Dr. Benay Page and is considering FOLFOX, though the patient has declined chemotherapy, he would consider oral Xeloda. He was interested in knowing his options for additional radiation, and comes today to discuss whether there is any additional role for palliative radiotherapy.   PREVIOUS RADIATION THERAPY: Yes    04/19/12-05/30/12: 50.4 Gy in total to the abdomen; 45 Gy with a 5.4 Gy boost   PAST MEDICAL HISTORY:  Past Medical History  Diagnosis Date  . COPD (chronic obstructive pulmonary disease) ([place])   . Post herpetic neuralgia   . Hemorrhoids   . Anemia   . Stomach cancer ([place])     remission  . CAD (coronary artery disease) 9/14    mildly abnormal perfusion study  . BPH (benign prostatic hypertrophy)   . Muscle disease     pt denies  . HOH (hard of hearing)        PAST SURGICAL HISTORY: Past Surgical History  Procedure Laterality Date  . Stomach surgery      for ulcers, age 59  . Eus  03/30/2012    Procedure: UPPER ENDOSCOPIC ULTRASOUND (EUS) LINEAR;  Surgeon: Milus Banister, MD;  Location: [facility];  Service: Endoscopy;  Laterality: N/A;  . Cataract extraction w/ intraocular lens implant Right 2016  . Esophagogastroduodenoscopy (egd) with propofol N/A 04/14/2016    Procedure: ESOPHAGOGASTRODUODENOSCOPY (EGD) WITH PROPOFOL;  Surgeon: Jerene Bears, MD;  Location: [facility];  Service: Gastroenterology;  Laterality: N/A;     FAMILY HISTORY:  Family History  Problem Relation Age of Onset  . Heart disease Brother   . Cancer Mother 45    breast ca with mets to lymph nodes  . Colon cancer Neg Hx      SOCIAL HISTORY:  reports that he quit smoking about 12 years ago. His smoking use included Cigarettes. He quit after 45 years of use. He has never used smokeless tobacco. He reports that he does not drink alcohol or use  illicit drugs. The patient is married and accompanied by his wife. They have one son who lives in the [place]. area. They live in [place]. He is originally from [place].   ALLERGIES: Review of patient's allergies indicates no known allergies.   MEDICATIONS:  Current Outpatient Prescriptions  Medication Sig Dispense Refill  . albuterol (ACCUNEB) 1.25 MG/3ML nebulizer solution Take 3 mLs (1.25 mg total) by nebulization 2 (two) times daily. With budesonide and  bid prn for SOB (Patient not taking: Reported on 04/26/2016) 75 mL 2  . albuterol (PROVENTIL HFA;VENTOLIN HFA) 108 (90 Base) MCG/ACT inhaler Inhale 2 puffs into the lungs every 6 (six) hours as needed for wheezing or shortness of breath. 1 Inhaler 1  . budesonide (PULMICORT) 0.25 MG/2ML nebulizer solution Take 2 mLs (0.25 mg total) by nebulization 2 (two) times daily. 60 mL 0  . ferrous sulfate 325 (65 FE) MG tablet Take 650 mg by mouth daily with breakfast. Reported on 04/26/2016    . HYDROcodone-acetaminophen (NORCO/VICODIN) 5-325 MG tablet Take 1 tablet by mouth every 4 (four) hours as needed for moderate pain. (Patient not taking: Reported on 04/26/2016) 30 tablet 0  . pantoprazole (PROTONIX) 40 MG tablet Take 1 tablet (40 mg total) by mouth daily. (Patient not taking: Reported on 04/26/2016) 90 tablet 3  . tiotropium (SPIRIVA) 18 MCG inhalation capsule Place 18 mcg into inhaler and inhale daily.     No current facility-administered medications for this encounter.     REVIEW OF SYSTEMS: On review of systems, the patient reports that he is doing well overall. He denies any chest pain, shortness of breath, cough, fevers, chills, night sweats, unintended weight changes. He denies any abdominal pain list moment, but has noticed  He denies any bowel or bladder disturbances. He denies any new musculoskeletal or joint aches or pains. A complete review of systems is obtained and is otherwise negative.    PHYSICAL EXAM:  height is '5\' 6"'$  (1.676 m) and weight is 105 lb 12.8 oz (47.991 kg). His oral temperature is 98.7 F (37.1 C). His blood pressure is 83/49 and his pulse is 85. His respiration is 20.   Pain scale 0/10 In general this is a well appearing Caucasian male in no acute distress. He is alert and oriented x4 and appropriate throughout the examination. HEENT reveals that the patient is normocephalic, atraumatic. EOMs are intact. PERRLA. Skin is intact without any evidence of gross lesions. Cardiovascular  exam reveals a regular rate and rhythm, no clicks rubs or murmurs are auscultated. Chest is clear to auscultation bilaterally. Lymphatic assessment is performed and does not reveal any adenopathy in the cervical, supraclavicular, axillary, or inguinal chains. Abdomen has active bowel sounds in all quadrants and is intact. The abdomen is soft, non tender, non distended. Lower extremities are negative for pretibial pitting edema, deep calf tenderness, cyanosis or clubbing.   ECOG = 1  0 - Asymptomatic (Fully active, able to carry on all predisease activities without restriction)  1 - Symptomatic but completely ambulatory (Restricted in physically strenuous activity but ambulatory and able to carry out work of a light or sedentary nature. For example, light housework, office work)  2 - Symptomatic, <50% in bed during the day (Ambulatory and capable of all self care but unable to carry out any work activities. Up and about more than 50% of waking hours)  3 - Symptomatic, >50% in bed, but not bedbound (Capable of only limited self-care, confined to bed or chair 50% or more of  waking hours)  4 - Bedbound (Completely disabled. Cannot carry on any self-care. Totally confined to bed or chair)  5 - Death   Eustace Pen MM, Creech RH, Tormey DC, et al. [phone]). "Toxicity and response criteria of the [facility]". [place] Oncol. 5 (6): 649-55    LABORATORY DATA:  Lab Results  Component Value Date   WBC 7.9 03/18/2016   HGB 12.6* 04/14/2016   HCT 37.0* 04/14/2016   MCV 80.2 03/18/2016   PLT 266 03/18/2016   Lab Results  Component Value Date   NA 141 04/14/2016   K 4.0 04/14/2016   CL 100* 04/14/2016   CO2 25 03/18/2016   Lab Results  Component Value Date   ALT <9 03/18/2016   AST 19 03/18/2016   ALKPHOS 91 03/18/2016   BILITOT 0.34 03/18/2016      RADIOGRAPHY: No results found.     IMPRESSION:  Recurrent adenocarcinoma of the stomach   PLAN: Dr. Lisbeth Renshaw  discusses the imaging findings from the patient's CT imaging as well as his EGD findings, and pathology results. He discusses that there is some room for repeat radiotherapy treatments. He discusses the risks including poor healing and tissue injury. He discusses the rationale for 2 weeks of radiotherapy and to consider BID administration to avoid toxicity by 20 fractions in 2 weeks, spaced out 6 hours or more apart. The alternative would be to administer 10 fractions in two weeks daily. The logistics of this are reviewed and the patient is interested in daily treatment rather than BID treatment. The risks, benefits, short, and long term effects of radiotherapy were discussed. We will set up simulation today and anticipate starting radiation next Monday.We will also keep Dr. Benay Page informed that the patient is interested in oral chemotherapy with Xeloda.  The above documentation reflects my direct findings during this shared patient visit. Please see the separate note by Dr. Lisbeth Renshaw on this date for the remainder of the patient's plan of care.    Carola Rhine, PAC

## 2016-04-28 DIAGNOSIS — C160 Malignant neoplasm of cardia: Secondary | ICD-10-CM | POA: Diagnosis not present

## 2016-04-28 DIAGNOSIS — Z510 Encounter for antineoplastic radiation therapy: Secondary | ICD-10-CM | POA: Diagnosis not present

## 2016-04-28 DIAGNOSIS — C162 Malignant neoplasm of body of stomach: Secondary | ICD-10-CM | POA: Diagnosis not present

## 2016-04-29 ENCOUNTER — Other Ambulatory Visit: Payer: Self-pay | Admitting: Urology

## 2016-04-29 ENCOUNTER — Encounter: Payer: Self-pay | Admitting: Urology

## 2016-04-29 ENCOUNTER — Telehealth: Payer: Self-pay | Admitting: *Deleted

## 2016-04-29 ENCOUNTER — Ambulatory Visit (INDEPENDENT_AMBULATORY_CARE_PROVIDER_SITE_OTHER): Payer: Medicare Other | Admitting: Urology

## 2016-04-29 VITALS — BP 129/77 | HR 89 | Ht 66.0 in | Wt 105.0 lb

## 2016-04-29 DIAGNOSIS — R9341 Abnormal radiologic findings on diagnostic imaging of renal pelvis, ureter, or bladder: Secondary | ICD-10-CM | POA: Diagnosis not present

## 2016-04-29 LAB — MICROSCOPIC EXAMINATION
Bacteria, UA: NONE SEEN
EPITHELIAL CELLS (NON RENAL): NONE SEEN /HPF (ref 0–10)
WBC, UA: NONE SEEN /hpf (ref 0–?)

## 2016-04-29 LAB — URINALYSIS, COMPLETE
BILIRUBIN UA: NEGATIVE
Glucose, UA: NEGATIVE
KETONES UA: NEGATIVE
Leukocytes, UA: NEGATIVE
NITRITE UA: NEGATIVE
PH UA: 5 (ref 5.0–7.5)
Protein, UA: NEGATIVE
RBC UA: NEGATIVE
SPEC GRAV UA: 1.01 (ref 1.005–1.030)
Urobilinogen, Ur: 0.2 mg/dL (ref 0.2–1.0)

## 2016-04-29 MED ORDER — CIPROFLOXACIN HCL 500 MG PO TABS
500.0000 mg | ORAL_TABLET | Freq: Once | ORAL | Status: AC
  Administered 2016-04-29: 500 mg via ORAL

## 2016-04-29 MED ORDER — LIDOCAINE HCL 2 % EX GEL
1.0000 "application " | Freq: Once | CUTANEOUS | Status: AC
  Administered 2016-04-29: 1 via URETHRAL

## 2016-04-29 NOTE — Telephone Encounter (Signed)
Pt is scheduled to begin radiation on 7/10. Called pt with appt to see Dr. Benay Spice 7/7 to discuss chemo with radiation, he requested I call wife. Left message on voicemail for [facility] with appt info. Requested she call to confirm. Order to schedulers for appt.

## 2016-04-29 NOTE — Progress Notes (Signed)
04/29/2016 10:51 AM   Danny Page 1935/05/05 [id number]  Referring provider: Venia Carbon, MD [address] [place], [place] [address]  Chief Complaint  Patient presents with  . Bladder Lesion    New Patient    HPI: The patient is a 81 year old gentleman with a past medical history of adenocarcinoma of the stomach presents for evaluation of an asymptomatic anterior bladder wall lesion that was found on surveillance imaging for his stomach cancer. He denies any genitourinary history. He has never seen blood in his urine before. He voids well.  In regards to his stomach cancer, he was recommended to undergo FOLFOX as he is not a surgical candidate. The patient is not interested in systemic chemotherapy at this time.This is a recurrence of his stomach cancer which was originally diagnosed approximately 3 years ago. He is scheduled for radiation and oral chemotherapy at this time.   PMH: Past Medical History  Diagnosis Date  . COPD (chronic obstructive pulmonary disease) ([place] [facility] [place])   . Post herpetic neuralgia   . Hemorrhoids   . Anemia   . Stomach cancer ([place])     remission  . CAD (coronary artery disease) 9/14    mildly abnormal perfusion study  . BPH (benign prostatic hypertrophy)   . Muscle disease     pt denies  . HOH (hard of hearing)     Surgical History: Past Surgical History  Procedure Laterality Date  . Stomach surgery      for ulcers, age 30  . Eus  03/30/2012    Procedure: UPPER ENDOSCOPIC ULTRASOUND (EUS) LINEAR;  Surgeon: Milus Banister, MD;  Location: [facility];  Service: Endoscopy;  Laterality: N/A;  . Cataract extraction w/ intraocular lens implant Right 2016  . Esophagogastroduodenoscopy (egd) with propofol N/A 04/14/2016    Procedure: ESOPHAGOGASTRODUODENOSCOPY (EGD) WITH PROPOFOL;  Surgeon: Jerene Bears, MD;  Location: [facility];  Service: Gastroenterology;  Laterality: N/A;    Home Medications:    Medication List       This list is  accurate as of: 04/29/16 10:51 AM.  Always use your most recent med list.               albuterol 108 (90 Base) MCG/ACT inhaler  Commonly known as:  PROVENTIL HFA;VENTOLIN HFA  Inhale 2 puffs into the lungs every 6 (six) hours as needed for wheezing or shortness of breath.     albuterol 1.25 MG/3ML nebulizer solution  Commonly known as:  ACCUNEB  Take 3 mLs (1.25 mg total) by nebulization 2 (two) times daily. With budesonide and bid prn for SOB     budesonide 0.25 MG/2ML nebulizer solution  Commonly known as:  PULMICORT  Take 2 mLs (0.25 mg total) by nebulization 2 (two) times daily.     ferrous sulfate 325 (65 FE) MG tablet  Take 650 mg by mouth daily with breakfast. Reported on 04/26/2016     HYDROcodone-acetaminophen 5-325 MG tablet  Commonly known as:  NORCO/VICODIN  Take 1 tablet by mouth every 4 (four) hours as needed for moderate pain.     pantoprazole 40 MG tablet  Commonly known as:  PROTONIX  Take 1 tablet (40 mg total) by mouth daily.     tiotropium 18 MCG inhalation capsule  Commonly known as:  SPIRIVA  Place 18 mcg into inhaler and inhale daily.        Allergies: No Known Allergies  Family History: Family History  Problem Relation Age of Onset  .  Heart disease Brother   . Cancer Mother 95    breast ca with mets to lymph nodes  . Colon cancer Neg Hx     Social History:  reports that he quit smoking about 12 years ago. His smoking use included Cigarettes. He quit after 45 years of use. He has never used smokeless tobacco. He reports that he does not drink alcohol or use illicit drugs.  ROS:                                        Physical Exam: There were no vitals taken for this visit.  Constitutional:  Alert and oriented, No acute distress. HEENT: [place] AT, moist mucus membranes.  Trachea midline, no masses. Cardiovascular: No clubbing, cyanosis, or edema. Respiratory: Normal respiratory effort, no increased work of breathing. GI:  Abdomen is soft, nontender, nondistended, no abdominal masses GU: No CVA tenderness.  Skin: No rashes, bruises or suspicious lesions. Lymph: No cervical or inguinal adenopathy. Neurologic: Grossly intact, no focal deficits, moving all 4 extremities. Psychiatric: Normal mood and affect.  Laboratory Data: Lab Results  Component Value Date   WBC 7.9 03/18/2016   HGB 12.6* 04/14/2016   HCT 37.0* 04/14/2016   MCV 80.2 03/18/2016   PLT 266 03/18/2016    Lab Results  Component Value Date   CREATININE 0.80 04/14/2016    Lab Results  Component Value Date   PSA 1.36 07/01/2009   PSA 1.96 12/18/2007    No results found for: TESTOSTERONE  No results found for: HGBA1C  Urinalysis    Component Value Date/Time   BILIRUBINUR NEG 07/15/2014 1624   PROTEINUR neg 07/15/2014 1624   UROBILINOGEN negative 07/15/2014 1624   NITRITE neg 07/15/2014 1624   LEUKOCYTESUR Negative 07/15/2014 1624    Pertinent Imaging: CLINICAL DATA: New constant RIGHT upper quadrant pain. History of malignant neoplasm of the stomach.  EXAM: CT ABDOMEN AND PELVIS WITH CONTRAST  TECHNIQUE: Multidetector CT imaging of the abdomen and pelvis was performed using the standard protocol following bolus administration of intravenous contrast.  CONTRAST: 153mL ISOVUE-300 IOPAMIDOL (ISOVUE-300) INJECTION 61%  COMPARISON: CT 11/26/2015  FINDINGS: Lower chest: Lung bases are clear.  Hepatobiliary: No focal hepatic lesion. Normal gallbladder  Pancreas: Mild atrophy of the pancreas. No pancreatic duct dilatation or lesion.  Spleen: Normal spleen  Adrenals/urinary tract: There is nodular thickening of the adrenal glands unchanged from prior.  Again noted a broad lesion along the anterior LEFT wall the bladder measuring 3.6 cm in length along the bladder wall and 1.5 cm in depth. Lesion is increased in length along the bladder wall compared to prior. In coronal rotation lesion appears  similar (image 38, series 602)  .  Stomach/Bowel: Again demonstrated irregular thickening along the gastric cardia extending into the lower esophagus measuring up to 2.5 cm in thickness compared to 2.3 cm on prior for no change. There is no evidence of gastric outlet obstruction. Gastrojejunostomy patent. Small bowel appendix and cecum normal. Diverticulum the descending colon without acute inflammation. Rectosigmoid colon is normal.  Vascular/Lymphatic: Abdominal aorta is normal caliber with atherosclerotic calcification. There is no retroperitoneal or periportal lymphadenopathy. No pelvic lymphadenopathy.  Reproductive: Enhancement of the transitional zone of prostate gland. No change.  Other: No free fluid.  Musculoskeletal: No aggressive osseous lesion.  IMPRESSION: 1. Irregular thickening of the gastric wall involving the gastric cardia and the gastroesophageal junction consistent with  gastric carcinoma. 2. Nodular thickening of the adrenal glands is indeterminate but similar to comparison exam. 3. Broad mucosal lesion involving the anterior wall of the bladder. Recommend cystoscopy for further evaluation.   Cystoscopy Procedure Note  Patient identification was confirmed, informed consent was obtained, and patient was prepped using Betadine solution.  Lidocaine jelly was administered per urethral meatus.    Preoperative abx where received prior to procedure.     Pre-Procedure: - Inspection reveals a normal caliber ureteral meatus.  Procedure: The flexible cystoscope was introduced without difficulty - No urethral strictures/lesions are present. - Enlarged prostate  - Normal bladder neck - Bilateral ureteral orifices identified  The patient has an exophytic tumor on the left lateral wall that is nodular in appearance. Does not appear to be typical papillary look with transitional cell carcinoma.   Post-Procedure: - Patient tolerated the procedure  well    Assessment & Plan:   I discussed the patient the risks, benefits, indications of this procedure. We discussed that the tumors bladder does not appear similar to typical transitional cell carcinoma. Nonetheless the recommendation is to undergo transurethral resection of bladder tumor. He understands risks, benefits, indications procedure. He understands risks include but are not limited to bleeding, infection, need for repeat procedures, incomplete resection, need for Foley catheter among others. All questions were answered. He is elected to proceed.  1. Bladder tumor -Transurethral resection of bladder tumor - Will need clearance from medical oncology as he'll be starting oral chemotherapy agent in her future. We'll need to ensure that it is safe for him to undergo this procedure well on that medication.     Nickie Retort, MD  [facility] [address] [place], [place] [address] [phone]

## 2016-04-30 ENCOUNTER — Ambulatory Visit (HOSPITAL_BASED_OUTPATIENT_CLINIC_OR_DEPARTMENT_OTHER): Payer: Medicare Other | Admitting: Oncology

## 2016-04-30 ENCOUNTER — Telehealth: Payer: Self-pay | Admitting: Oncology

## 2016-04-30 ENCOUNTER — Telehealth: Payer: Self-pay | Admitting: Radiology

## 2016-04-30 VITALS — BP 97/60 | HR 96 | Temp 99.0°F | Resp 18 | Ht 66.0 in | Wt 104.7 lb

## 2016-04-30 DIAGNOSIS — N329 Bladder disorder, unspecified: Secondary | ICD-10-CM

## 2016-04-30 DIAGNOSIS — D500 Iron deficiency anemia secondary to blood loss (chronic): Secondary | ICD-10-CM

## 2016-04-30 DIAGNOSIS — C169 Malignant neoplasm of stomach, unspecified: Secondary | ICD-10-CM

## 2016-04-30 DIAGNOSIS — K769 Liver disease, unspecified: Secondary | ICD-10-CM

## 2016-04-30 DIAGNOSIS — C162 Malignant neoplasm of body of stomach: Secondary | ICD-10-CM

## 2016-04-30 MED ORDER — CAPECITABINE 500 MG PO TABS: 1000.0000 mg | ORAL_TABLET | Freq: Two times a day (BID) | ORAL | Status: DC

## 2016-04-30 MED FILL — CAPECITABINE 500 MG TABLET: 500 | 14 days supply | Qty: 40 | Fill #0

## 2016-04-30 NOTE — Telephone Encounter (Signed)
Gave and pritned appt sched and avs for pt for July  °

## 2016-04-30 NOTE — Telephone Encounter (Signed)
LMOM. Need to notify pt of surgery information. 

## 2016-04-30 NOTE — Progress Notes (Signed)
[place] OFFICE PROGRESS NOTE   Diagnosis: Gastric cancer  INTERVAL HISTORY:   Mr.Saldierna returns as scheduled. He reports mild discomfort after eating. No dysphagia. No bleeding. He saw Dr. Lisbeth Renshaw and is scheduled to begin palliative stomach radiation on 05/03/2016. Mr. Romualdo agrees to sensitizing capecitabine. He states he will not agree to other chemotherapy.  He saw Dr. Pilar Jarvis on 04/29/2016. A cystoscopy confirmed a left bladder wall mass. He is being scheduled for transurethral resection.   Objective:  Vital signs in last 24 hours:  Blood pressure 97/60, pulse 96, temperature 99 F (37.2 C), temperature source Oral, resp. rate 18, height 5\' 6"  (1.676 m), weight 104 lb 11.2 oz (47.492 kg), SpO2 97 %.   Resp:  distant breath sounds, coarse rhonchi at the lower posterior chest bilaterally, no respiratory distress Cardio:  regular rate and rhythm GI:  nontender, no hepatomegaly, no mass Vascular:  no leg edema   Lab Results:  Lab Results  Component Value Date   WBC 7.9 03/18/2016   HGB 12.6* 04/14/2016   HCT 37.0* 04/14/2016   MCV 80.2 03/18/2016   PLT 266 03/18/2016   NEUTROABS 5.5 03/18/2016    Medications: I have reviewed the patient's current medications.  Assessment/Plan: 1. Adenocarcinoma of the stomach. Adenocarcinoma found in the gastric remnant status post previous partial gastrectomy/Billroth II anastomosis. No evidence of metastatic disease by physical exam or CT of the abdomen/pelvis. Staging EUS on 03/30/2012 confirmed a uT3 uN0 tumor. He began radiation and concurrent Xeloda chemotherapy on 04/18/2012. Xeloda was subsequently placed on hold due to chest pain. Xeloda was resumed on 04/26/2012 with no further chest pain. The Xeloda dose was titrated to 1000 mg twice daily beginning 04/28/2012. He completed treatment on 05/30/2012. Restaging CT on 08/01/2012 with no evidence of metastatic disease. -Upper endoscopy 05/08/2013 with abnormal mucosa  at the lesser curvature and no tumor mass seen, biopsy negative for malignancy  -CT 11/26/2015 revealed increased gastric wall thickening and thickening at the anterior bladder -CT 03/18/2016 revealed stable gastric wall thickening, increased size of anterior bladder wall lesion -Upper endoscopy 04/14/2016 revealed tumor involving the gastric cardia/fundus and Billroth II anastomosis, biopsies confirmed adenocarcinoma  2. Remote partial gastrectomy/Billroth II anastomosis secondary to ulcer disease. 3. Iron deficiency anemia secondary to the gastric mass status post red cell transfusion 03/17/2012.  4. COPD. 5. History of colon polyps. 6. History of Chest pain/subxiphoid pain-most likely related to radiation esophagitis/gastritis or the gastric mass 7. Anorexia/weight loss-? Secondary to gastric cancer or COPD, depression 8. Depression-he started a trial of Remeron on 06/13/2012, no longer taking Remeron 9. Dyspnea/low-grade fever at an office visit on 06/20/2012-? COPD flare,? Radiation pneumonitis-he completed a trial of prednisone. The symptoms partially improved. 10. History of Orthostasis 11. History of Dysphagia-none today 12. Indeterminate 8 mm left liver lesion on the CT 08/01/2012. 13.  bladder mass confirmed on cystoscopy 04/29/2016   Disposition: Mr.Helser appears unchanged. He declines FOLFOX chemotherapy. He agrees to a course of pallor radiation and radiosensitizing capecitabine. We reviewed the potential toxicities associated with capecitabine. He will take capecitabine on days radiation on the. He is scheduled to begin treatment on 05/03/2016 for a 2 week course.  The bladder lesion could represent a primary tumor or a metastasis from the gastric cancer. He understands it is unlikely resection of the bladder mass will impact on his lifespan. His initial indication is he would like to proceed with removal of the bladder tumor.  Mr. Lardner will return for an  office visit on  05/14/2016.         [name], MD  04/30/2016  1:05 PM

## 2016-04-30 NOTE — Telephone Encounter (Signed)
Notified pt's wife of surgery scheduled 05/19/16, pre-admit testing appt on 05/05/16 @9 :30 & to call day prior to surgery for arrival time to SDS. Wife voices understanding.

## 2016-04-30 NOTE — Telephone Encounter (Signed)
Pt called to cancel bladder surgery. He does not want to r/s at this time.

## 2016-04-30 NOTE — Telephone Encounter (Signed)
S.w. Pt and advised on todays appt...the patient ok and aware °

## 2016-05-03 ENCOUNTER — Ambulatory Visit
Admission: RE | Admit: 2016-05-03 | Discharge: 2016-05-03 | Disposition: A | Discharge status: Home / Self Care | Payer: Medicare Other | Source: Ambulatory Visit | Attending: Radiation Oncology | Admitting: Radiation Oncology

## 2016-05-03 DIAGNOSIS — Z510 Encounter for antineoplastic radiation therapy: Secondary | ICD-10-CM | POA: Diagnosis not present

## 2016-05-03 DIAGNOSIS — C162 Malignant neoplasm of body of stomach: Secondary | ICD-10-CM | POA: Diagnosis not present

## 2016-05-03 DIAGNOSIS — C160 Malignant neoplasm of cardia: Secondary | ICD-10-CM | POA: Diagnosis not present

## 2016-05-04 ENCOUNTER — Other Ambulatory Visit: Payer: Self-pay | Admitting: Radiation Oncology

## 2016-05-04 ENCOUNTER — Ambulatory Visit
Admission: RE | Admit: 2016-05-04 | Discharge: 2016-05-04 | Disposition: A | Discharge status: Home / Self Care | Payer: Medicare Other | Source: Ambulatory Visit | Attending: Radiation Oncology | Admitting: Radiation Oncology

## 2016-05-04 DIAGNOSIS — C162 Malignant neoplasm of body of stomach: Secondary | ICD-10-CM | POA: Diagnosis not present

## 2016-05-04 DIAGNOSIS — C169 Malignant neoplasm of stomach, unspecified: Secondary | ICD-10-CM

## 2016-05-04 DIAGNOSIS — Z510 Encounter for antineoplastic radiation therapy: Secondary | ICD-10-CM | POA: Diagnosis not present

## 2016-05-04 DIAGNOSIS — C160 Malignant neoplasm of cardia: Secondary | ICD-10-CM | POA: Diagnosis not present

## 2016-05-04 MED ORDER — SONAFINE EX EMUL
1.0000 "application " | Freq: Two times a day (BID) | CUTANEOUS | Status: DC
  Administered 2016-05-04: 1 via TOPICAL

## 2016-05-04 MED ORDER — ONDANSETRON HCL 8 MG PO TABS: 8.0000 mg | ORAL_TABLET | Freq: Three times a day (TID) | ORAL | Status: AC | PRN

## 2016-05-04 NOTE — Progress Notes (Signed)
Patient education, radiation therapy and you book, my business card, sonafine cream given, to use on treated skin area daily and prn, discussed side effects skin irritation, nausea, vomiting, diarrhea, need for IVF's possibly, pain, weight loss, has 10 treatments, has had rad tx before years ago, reinforce , verbal understanding, teach back given

## 2016-05-05 ENCOUNTER — Other Ambulatory Visit: Payer: Medicare Other

## 2016-05-05 ENCOUNTER — Ambulatory Visit
Admission: RE | Admit: 2016-05-05 | Discharge: 2016-05-05 | Disposition: A | Discharge status: Home / Self Care | Payer: Medicare Other | Source: Ambulatory Visit | Attending: Radiation Oncology | Admitting: Radiation Oncology

## 2016-05-05 ENCOUNTER — Ambulatory Visit: Payer: Medicare Other | Admitting: Oncology

## 2016-05-05 DIAGNOSIS — C162 Malignant neoplasm of body of stomach: Secondary | ICD-10-CM | POA: Diagnosis not present

## 2016-05-05 DIAGNOSIS — Z510 Encounter for antineoplastic radiation therapy: Secondary | ICD-10-CM | POA: Diagnosis not present

## 2016-05-05 DIAGNOSIS — C160 Malignant neoplasm of cardia: Secondary | ICD-10-CM | POA: Diagnosis not present

## 2016-05-06 ENCOUNTER — Ambulatory Visit
Admission: RE | Admit: 2016-05-06 | Discharge: 2016-05-06 | Disposition: A | Discharge status: Home / Self Care | Payer: Medicare Other | Source: Ambulatory Visit | Attending: Radiation Oncology | Admitting: Radiation Oncology

## 2016-05-06 DIAGNOSIS — C162 Malignant neoplasm of body of stomach: Secondary | ICD-10-CM | POA: Diagnosis not present

## 2016-05-06 DIAGNOSIS — Z510 Encounter for antineoplastic radiation therapy: Secondary | ICD-10-CM | POA: Diagnosis not present

## 2016-05-06 DIAGNOSIS — C160 Malignant neoplasm of cardia: Secondary | ICD-10-CM | POA: Diagnosis not present

## 2016-05-07 ENCOUNTER — Ambulatory Visit
Admission: RE | Admit: 2016-05-07 | Discharge: 2016-05-07 | Disposition: A | Discharge status: Home / Self Care | Payer: Medicare Other | Source: Ambulatory Visit | Attending: Radiation Oncology | Admitting: Radiation Oncology

## 2016-05-07 ENCOUNTER — Encounter: Payer: Self-pay | Admitting: Radiation Oncology

## 2016-05-07 VITALS — BP 137/53 | HR 85 | Temp 98.7°F | Resp 16 | Wt 103.4 lb

## 2016-05-07 DIAGNOSIS — C160 Malignant neoplasm of cardia: Secondary | ICD-10-CM

## 2016-05-07 DIAGNOSIS — C162 Malignant neoplasm of body of stomach: Secondary | ICD-10-CM | POA: Diagnosis not present

## 2016-05-07 DIAGNOSIS — Z510 Encounter for antineoplastic radiation therapy: Secondary | ICD-10-CM | POA: Diagnosis not present

## 2016-05-07 NOTE — Progress Notes (Addendum)
Weekly rad txs abdomen 5/10 completed, c/o abdomen, high up pain, started at 8am, took hydrocodone tab 5/325mg , 1.5hours ago  Started again with   With radiation pain 8/10 , moaning, was nausea yesterday after rad , has zofran and taking, ate only cranberry juice., 1/2 cup coffee today, ate only soup yesterday starting to subside now stated patient  10:37 AM BP 137/53 mmHg  Pulse 85  Temp(Src) 98.7 F (37.1 C) (Oral)  Resp 16  Wt 103 lb 6.4 oz (46.902 kg)  Wt Readings from Last 3 Encounters:  05/07/16 103 lb 6.4 oz (46.902 kg)  04/30/16 104 lb 11.2 oz (47.492 kg)  04/29/16 105 lb (47.628 kg)

## 2016-05-08 DIAGNOSIS — C160 Malignant neoplasm of cardia: Secondary | ICD-10-CM | POA: Insufficient documentation

## 2016-05-08 NOTE — Progress Notes (Signed)
Department of Radiation Oncology  Phone:  [phone] Fax:        [phone]  Weekly Treatment Note    Name: Danny Page Date: 05/08/2016 MRN: [id number] DOB: 1935/07/22   Diagnosis:     ICD-9-CM ICD-10-CM   1. Malignant neoplasm of cardia of stomach (HCC) 151.0 C16.0   2. 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 albuterol (ACCUNEB) 1.25 MG/3ML nebulizer solution Take 3 mLs (1.25 mg total) by nebulization 2 (two) times daily. With budesonide and bid prn for SOB 75 mL 2  . albuterol (PROVENTIL HFA;VENTOLIN HFA) 108 (90 Base) MCG/ACT inhaler Inhale 2 puffs into the lungs every 6 (six) hours as needed for wheezing or shortness of breath. 1 Inhaler 1  . budesonide (PULMICORT) 0.25 MG/2ML nebulizer solution Take 2 mLs (0.25 mg total) by nebulization 2 (two) times daily. 60 mL 0  . capecitabine (XELODA) 500 MG tablet Take 2 tablets (1,000 mg total) by mouth 2 (two) times daily after a meal. On days of radiation only. (Mon-Fri) 40 tablet 0  . HYDROcodone-acetaminophen (NORCO/VICODIN) 5-325 MG tablet Take 1 tablet by mouth every 4 (four) hours as needed for moderate pain. 30 tablet 0  . ondansetron (ZOFRAN) 8 MG tablet Take 1 tablet (8 mg total) by mouth every 8 (eight) hours as needed for nausea or vomiting. 30 tablet 3  . pantoprazole (PROTONIX) 40 MG tablet Take 1 tablet (40 mg total) by mouth daily. 90 tablet 3  . tiotropium (SPIRIVA) 18 MCG inhalation capsule Place 18 mcg into inhaler and inhale daily.    . Wound Dressings (SONAFINE) Apply 1 application topically daily.     No current facility-administered medications for this encounter.     ALLERGIES: Review of patient's allergies indicates no known allergies.   LABORATORY DATA:  Lab Results  Component Value Date   WBC 7.9 03/18/2016   HGB 12.6* 04/14/2016   HCT 37.0*  04/14/2016   MCV 80.2 03/18/2016   PLT 266 03/18/2016   Lab Results  Component Value Date   NA 141 04/14/2016   K 4.0 04/14/2016   CL 100* 04/14/2016   CO2 25 03/18/2016   Lab Results  Component Value Date   ALT <9 03/18/2016   AST 19 03/18/2016   ALKPHOS 91 03/18/2016   BILITOT 0.34 03/18/2016     NARRATIVE: Danny Page was seen today for weekly treatment management. The chart was checked and the patient's films were reviewed.  Weekly rad txs abdomen 5/10 completed, c/o abdomen, high up pain, started at 8am, took hydrocodone tab 5/325mg , 1.5hours ago  Started again with   With radiation pain 8/10 , moaning, was nausea yesterday after rad , has zofran and taking, ate only cranberry juice., 1/2 cup coffee today, ate only soup yesterday starting to subside now stated patient  8:46 PM BP 137/53 mmHg  Pulse 85  Temp(Src) 98.7 F (37.1 C) (Oral)  Resp 16  Wt 103 lb 6.4 oz (46.902 kg)  Wt Readings from Last 3 Encounters:  05/07/16 103 lb 6.4 oz (46.902 kg)  04/30/16 104 lb 11.2 oz (47.492 kg)  04/29/16 105 lb (47.628 kg)    PHYSICAL EXAMINATION: weight is 103 lb 6.4 oz (46.902 kg). His oral temperature is 98.7 F (37.1 C). His blood pressure is 137/53 and his pulse is 85. His respiration is 16.  ASSESSMENT: The patient is doing satisfactorily with treatment.  PLAN: We will continue with the patient's radiation treatment as planned.

## 2016-05-09 DIAGNOSIS — J449 Chronic obstructive pulmonary disease, unspecified: Secondary | ICD-10-CM | POA: Diagnosis not present

## 2016-05-10 ENCOUNTER — Ambulatory Visit
Admission: RE | Admit: 2016-05-10 | Discharge: 2016-05-10 | Disposition: A | Discharge status: Home / Self Care | Payer: Medicare Other | Source: Ambulatory Visit | Attending: Radiation Oncology | Admitting: Radiation Oncology

## 2016-05-10 DIAGNOSIS — C162 Malignant neoplasm of body of stomach: Secondary | ICD-10-CM | POA: Diagnosis not present

## 2016-05-10 DIAGNOSIS — J449 Chronic obstructive pulmonary disease, unspecified: Secondary | ICD-10-CM | POA: Diagnosis not present

## 2016-05-10 DIAGNOSIS — Z510 Encounter for antineoplastic radiation therapy: Secondary | ICD-10-CM | POA: Diagnosis not present

## 2016-05-10 DIAGNOSIS — C160 Malignant neoplasm of cardia: Secondary | ICD-10-CM | POA: Diagnosis not present

## 2016-05-11 ENCOUNTER — Ambulatory Visit
Admission: RE | Admit: 2016-05-11 | Discharge: 2016-05-11 | Disposition: A | Discharge status: Home / Self Care | Payer: Medicare Other | Source: Ambulatory Visit | Attending: Radiation Oncology | Admitting: Radiation Oncology

## 2016-05-11 ENCOUNTER — Other Ambulatory Visit: Payer: Self-pay | Admitting: Internal Medicine

## 2016-05-11 DIAGNOSIS — Z510 Encounter for antineoplastic radiation therapy: Secondary | ICD-10-CM | POA: Diagnosis not present

## 2016-05-11 DIAGNOSIS — C160 Malignant neoplasm of cardia: Secondary | ICD-10-CM | POA: Diagnosis not present

## 2016-05-11 DIAGNOSIS — C162 Malignant neoplasm of body of stomach: Secondary | ICD-10-CM | POA: Diagnosis not present

## 2016-05-12 ENCOUNTER — Ambulatory Visit
Admission: RE | Admit: 2016-05-12 | Discharge: 2016-05-12 | Disposition: A | Discharge status: Home / Self Care | Payer: Medicare Other | Source: Ambulatory Visit | Attending: Radiation Oncology | Admitting: Radiation Oncology

## 2016-05-12 DIAGNOSIS — C160 Malignant neoplasm of cardia: Secondary | ICD-10-CM | POA: Diagnosis not present

## 2016-05-12 DIAGNOSIS — C162 Malignant neoplasm of body of stomach: Secondary | ICD-10-CM | POA: Diagnosis not present

## 2016-05-12 DIAGNOSIS — Z510 Encounter for antineoplastic radiation therapy: Secondary | ICD-10-CM | POA: Diagnosis not present

## 2016-05-13 ENCOUNTER — Ambulatory Visit
Admission: RE | Admit: 2016-05-13 | Discharge: 2016-05-13 | Disposition: A | Discharge status: Home / Self Care | Payer: Medicare Other | Source: Ambulatory Visit | Attending: Radiation Oncology | Admitting: Radiation Oncology

## 2016-05-13 DIAGNOSIS — C160 Malignant neoplasm of cardia: Secondary | ICD-10-CM | POA: Diagnosis not present

## 2016-05-13 DIAGNOSIS — Z510 Encounter for antineoplastic radiation therapy: Secondary | ICD-10-CM | POA: Diagnosis not present

## 2016-05-13 DIAGNOSIS — C162 Malignant neoplasm of body of stomach: Secondary | ICD-10-CM | POA: Diagnosis not present

## 2016-05-14 ENCOUNTER — Encounter: Payer: Self-pay | Admitting: Radiation Oncology

## 2016-05-14 ENCOUNTER — Telehealth: Payer: Self-pay | Admitting: *Deleted

## 2016-05-14 ENCOUNTER — Other Ambulatory Visit: Payer: Medicare Other

## 2016-05-14 ENCOUNTER — Ambulatory Visit
Admission: RE | Admit: 2016-05-14 | Discharge: 2016-05-14 | Disposition: A | Discharge status: Home / Self Care | Payer: Medicare Other | Source: Ambulatory Visit | Attending: Radiation Oncology | Admitting: Radiation Oncology

## 2016-05-14 ENCOUNTER — Ambulatory Visit: Payer: Medicare Other | Admitting: Nurse Practitioner

## 2016-05-14 ENCOUNTER — Telehealth: Payer: Self-pay | Admitting: Nurse Practitioner

## 2016-05-14 ENCOUNTER — Ambulatory Visit: Payer: Medicare Other

## 2016-05-14 VITALS — BP 103/53 | HR 79 | Temp 97.8°F | Resp 20 | Wt 101.8 lb

## 2016-05-14 DIAGNOSIS — C160 Malignant neoplasm of cardia: Secondary | ICD-10-CM | POA: Diagnosis not present

## 2016-05-14 DIAGNOSIS — C162 Malignant neoplasm of body of stomach: Secondary | ICD-10-CM | POA: Diagnosis not present

## 2016-05-14 DIAGNOSIS — Z510 Encounter for antineoplastic radiation therapy: Secondary | ICD-10-CM | POA: Diagnosis not present

## 2016-05-14 MED ORDER — METOCLOPRAMIDE HCL 10 MG PO TABS: 10.0000 mg | ORAL_TABLET | Freq: Four times a day (QID) | ORAL | Status: DC

## 2016-05-14 NOTE — Telephone Encounter (Signed)
per Enid Derry in radaition pt wanted to r/s appt from 7/21-gave 7/28 appt @1 :15-pt aware

## 2016-05-14 NOTE — Telephone Encounter (Signed)
CALLED PATIENT TO INFORM THAT APPT. FOR THIS PATIENT HAS BEEN RESCHEDULED FOR 05-21-16- ARRIVAL TIME - 1 PM WITH LISA THOMAS, LVM FOR A RETURN CALL

## 2016-05-14 NOTE — Progress Notes (Addendum)
Weekly rad txs abdomen, 10/10  eating very little, eats a couple bites and gets full, nausea but doesn'y throw up, ate 2 bites of bun this am and drank hot tea, occasiopnal flatulence, very little bowel movement, zofran not helping his nuasea wants a different rx very fatiued, 1 month follw up appt made 10:59 AM 'BP 103/53 mmHg  Pulse 79  Temp(Src) 97.8 F (36.6 C) (Oral)  Resp 20  Wt 101 lb 12.8 oz (46.176 kg)  Wt Readings from Last 3 Encounters:  05/14/16 101 lb 12.8 oz (46.176 kg)  05/07/16 103 lb 6.4 oz (46.902 kg)  04/30/16 104 lb 11.2 oz (47.492 kg)

## 2016-05-15 NOTE — Progress Notes (Signed)
Department of Radiation Oncology  Phone:  [phone] Fax:        [phone]  Weekly Treatment Note    Name: Danny Page Date: 05/15/2016 MRN: [id number] DOB: [birth year]   Diagnosis:     ICD-9-CM ICD-10-CM   1. Adenocarcinoma of gastric cardia (HCC) 151.0 C16.0      Current dose: 25 Gy  Current fraction: 10   MEDICATIONS: Current Outpatient Prescriptions  Medication Sig Dispense Refill  . albuterol (ACCUNEB) 1.25 MG/3ML nebulizer solution USE ONE VIAL IN NEBULIZER TWICE DAILY WITH  BUDESONIDE  AND  TWICE  DAILY  AS  NEEDED  FOR  SHORTNESS  OF  BREATH 75 vial 5  . albuterol (PROVENTIL HFA;VENTOLIN HFA) 108 (90 Base) MCG/ACT inhaler Inhale 2 puffs into the lungs every 6 (six) hours as needed for wheezing or shortness of breath. 1 Inhaler 1  . budesonide (PULMICORT) 0.25 MG/2ML nebulizer solution Take 2 mLs (0.25 mg total) by nebulization 2 (two) times daily. 60 mL 0  . capecitabine (XELODA) 500 MG tablet Take 2 tablets (1,000 mg total) by mouth 2 (two) times daily after a meal. On days of radiation only. (Mon-Fri) 40 tablet 0  . HYDROcodone-acetaminophen (NORCO/VICODIN) 5-325 MG tablet Take 1 tablet by mouth every 4 (four) hours as needed for moderate pain. 30 tablet 0  . ondansetron (ZOFRAN) 8 MG tablet Take 1 tablet (8 mg total) by mouth every 8 (eight) hours as needed for nausea or vomiting. 30 tablet 3  . pantoprazole (PROTONIX) 40 MG tablet Take 1 tablet (40 mg total) by mouth daily. 90 tablet 3  . tiotropium (SPIRIVA) 18 MCG inhalation capsule Place 18 mcg into inhaler and inhale daily.    . Wound Dressings (SONAFINE) Apply 1 application topically daily.    . metoCLOPramide (REGLAN) 10 MG tablet Take 1 tablet (10 mg total) by mouth 4 (four) times daily. 90 tablet 1   No current facility-administered medications for this encounter.     ALLERGIES: Review of patient's allergies indicates no known allergies.   LABORATORY DATA:  Lab Results  Component Value Date     WBC 7.9 03/18/2016   HGB 12.6* 04/14/2016   HCT 37.0* 04/14/2016   MCV 80.2 03/18/2016   PLT 266 03/18/2016   Lab Results  Component Value Date   NA 141 04/14/2016   K 4.0 04/14/2016   CL 100* 04/14/2016   CO2 25 03/18/2016   Lab Results  Component Value Date   ALT <9 03/18/2016   AST 19 03/18/2016   ALKPHOS 91 03/18/2016   BILITOT 0.34 03/18/2016     NARRATIVE: Danny Page was seen today for weekly treatment management. The chart was checked and the patient's films were reviewed.  Weekly rad txs abdomen, 10/10  eating very little, eats a couple bites and gets full, nausea but doesn'y throw up, ate 2 bites of bun this am and drank hot tea, occasiopnal flatulence, very little bowel movement, zofran not helping his nuasea wants a different rx very fatiued, 1 month follw up appt made 12:04 PM 'BP 103/53 mmHg  Pulse 79  Temp(Src) 97.8 F (36.6 C) (Oral)  Resp 20  Wt 101 lb 12.8 oz (46.176 kg)  Wt Readings from Last 3 Encounters:  05/14/16 101 lb 12.8 oz (46.176 kg)  05/07/16 103 lb 6.4 oz (46.902 kg)  04/30/16 104 lb 11.2 oz (47.492 kg)    PHYSICAL EXAMINATION: weight is 101 lb 12.8 oz (46.176 kg). His oral temperature is 97.8 F (36.6  C). His blood pressure is 103/53 and his pulse is 79. His respiration is 20.        ASSESSMENT: The patient is doing satisfactorily with treatment.  Unfortunately, the patient has not had a significant improvement in his local symptoms. He still complains of nausea and this has not responded well to Zofran.  PLAN: We discussed that radiation treatment can continue to work after his course has been completed. I have given him a prescription for Reglan to take in addition to Zofran. We will tentatively plan to see the patient in one month but he knows to contact our office if necessary.

## 2016-05-15 NOTE — Progress Notes (Signed)
  Radiation Oncology         [phone] ________________________________  Name: Danny Page MRN: [id number]  Date: 04/26/2016  DOB: 18-Apr-1935  SIMULATION AND TREATMENT PLANNING NOTE  DIAGNOSIS:     ICD-9-CM ICD-10-CM   1. Adenocarcinoma of gastric cardia ([place]) 151.0 C16.0      Site:  Stomach  NARRATIVE:  The patient was brought to the [facility].  Identity was confirmed.  All relevant records and images related to the planned course of therapy were reviewed.   Written consent to proceed with treatment was confirmed which was freely given after reviewing the details related to the planned course of therapy had been reviewed with the patient.  Then, the patient was set-up in a stable reproducible  supine position for radiation therapy.  CT images were obtained.  Surface markings were placed.    Medically necessary complex treatment device(s) for immobilization:  Customized vac lock bag.   The CT images were loaded into the planning software.  Then the target and avoidance structures were contoured.  Treatment planning then occurred.  The radiation prescription was entered and confirmed.  A total of 4 complex treatment devices were fabricated which relate to the designed radiation treatment fields. Each of these customized fields/ complex treatment devices will be used on a daily basis during the radiation course. I have requested : 3D Simulation  I have requested a DVH of the following structures: Target volume, left kidney, right kidney, spinal cord, liver.   The patient will undergo daily image guidance to ensure accurate localization of the target, and adequate minimize dose to the normal surrounding structures in close proximity to the target.   PLAN:  The patient will receive 25 Gy in 10 fractions.  Special treatment procedure The patient will also receive concurrent chemotherapy during the treatment. The patient may therefore experience increased toxicity or side  effects and the patient will be monitored for such problems. This may require extra lab work as necessary. This therefore constitutes a special treatment procedure.  Additionally, this represents a site of reirradiation  ________________________________   Jodelle Gross, MD, PhD

## 2016-05-19 ENCOUNTER — Encounter: Admission: RE | Payer: Self-pay | Source: Ambulatory Visit

## 2016-05-19 ENCOUNTER — Ambulatory Visit: Admission: RE | Admit: 2016-05-19 | Payer: Medicare Other | Source: Ambulatory Visit | Admitting: Urology

## 2016-05-19 SURGERY — TURBT (TRANSURETHRAL RESECTION OF BLADDER TUMOR)
Anesthesia: Choice

## 2016-05-21 ENCOUNTER — Ambulatory Visit (HOSPITAL_BASED_OUTPATIENT_CLINIC_OR_DEPARTMENT_OTHER): Payer: Medicare Other | Admitting: Nurse Practitioner

## 2016-05-21 ENCOUNTER — Other Ambulatory Visit (HOSPITAL_BASED_OUTPATIENT_CLINIC_OR_DEPARTMENT_OTHER): Payer: Medicare Other

## 2016-05-21 ENCOUNTER — Telehealth: Payer: Self-pay | Admitting: Oncology

## 2016-05-21 ENCOUNTER — Other Ambulatory Visit: Payer: Self-pay

## 2016-05-21 ENCOUNTER — Other Ambulatory Visit: Payer: Medicare Other

## 2016-05-21 ENCOUNTER — Ambulatory Visit: Payer: Medicare Other | Admitting: Nurse Practitioner

## 2016-05-21 VITALS — BP 89/41 | HR 79 | Temp 97.7°F | Resp 18 | Ht 66.0 in | Wt 100.1 lb

## 2016-05-21 DIAGNOSIS — C169 Malignant neoplasm of stomach, unspecified: Secondary | ICD-10-CM

## 2016-05-21 DIAGNOSIS — N329 Bladder disorder, unspecified: Secondary | ICD-10-CM

## 2016-05-21 DIAGNOSIS — K769 Liver disease, unspecified: Secondary | ICD-10-CM | POA: Diagnosis not present

## 2016-05-21 DIAGNOSIS — D500 Iron deficiency anemia secondary to blood loss (chronic): Secondary | ICD-10-CM

## 2016-05-21 LAB — CBC WITH DIFFERENTIAL/PLATELET
BASO%: 1.2 % (ref 0.0–2.0)
BASOS ABS: 0.1 10*3/uL (ref 0.0–0.1)
EOS ABS: 0.1 10*3/uL (ref 0.0–0.5)
EOS%: 1.8 % (ref 0.0–7.0)
HEMATOCRIT: 34.3 % — AB (ref 38.4–49.9)
HEMOGLOBIN: 11.3 g/dL — AB (ref 13.0–17.1)
LYMPH#: 0.6 10*3/uL — AB (ref 0.9–3.3)
LYMPH%: 7.8 % — ABNORMAL LOW (ref 14.0–49.0)
MCH: 26.7 pg — AB (ref 27.2–33.4)
MCHC: 32.9 g/dL (ref 32.0–36.0)
MCV: 81.1 fL (ref 79.3–98.0)
MONO#: 1.2 10*3/uL — AB (ref 0.1–0.9)
MONO%: 15.5 % — ABNORMAL HIGH (ref 0.0–14.0)
NEUT#: 5.7 10*3/uL (ref 1.5–6.5)
NEUT%: 73.7 % (ref 39.0–75.0)
NRBC: 0 % (ref 0–0)
PLATELETS: 220 10*3/uL (ref 140–400)
RBC: 4.23 10*6/uL (ref 4.20–5.82)
RDW: 17 % — AB (ref 11.0–14.6)
WBC: 7.7 10*3/uL (ref 4.0–10.3)

## 2016-05-21 NOTE — Progress Notes (Addendum)
[facility] OFFICE PROGRESS NOTE   Diagnosis:  Gastric cancer  INTERVAL HISTORY:   Danny Page returns for follow-up. He completed the course of radiation on 05/14/2016. Abdominal pain is unchanged. He has pain medication at home but does not take it due to fear of addiction. His appetite is poor. He notes early satiety. He denies any bleeding. Last night he had an episode of nausea/dry heaves. He is urinating without difficulty. No hematuria.  Objective:  Vital signs in last 24 hours:  Blood pressure (!) 89/41, pulse 79, temperature 97.7 F (36.5 C), temperature source Oral, resp. rate 18, height 5\' 6"  (1.676 m), weight 100 lb 1.6 oz (45.4 kg), SpO2 100 %.    HEENT: No thrush or ulcers. Resp: Lungs clear bilaterally. Breath sounds are distant. Cardio: Regular rate and rhythm. GI: Abdomen is soft. No hepatomegaly. No mass. Tender at the upper mid abdomen. Vascular: No leg edema.   Lab Results:  Lab Results  Component Value Date   WBC 7.7 05/21/2016   HGB 11.3 (L) 05/21/2016   HCT 34.3 (L) 05/21/2016   MCV 81.1 05/21/2016   PLT 220 05/21/2016   NEUTROABS 5.7 05/21/2016    Imaging:  No results found.  Medications: I have reviewed the patient's current medications.  Assessment/Plan: 1. Adenocarcinoma of the stomach. Adenocarcinoma found in the gastric remnant status post previous partial gastrectomy/Billroth II anastomosis. No evidence of metastatic disease by physical exam or CT of the abdomen/pelvis. Staging EUS on 03/30/2012 confirmed a uT3 uN0 tumor. He began radiation and concurrent Xeloda chemotherapy on 04/18/2012. Xeloda was subsequently placed on hold due to chest pain. Xeloda was resumed on 04/26/2012 with no further chest pain. The Xeloda dose was titrated to 1000 mg twice daily beginning 04/28/2012. He completed treatment on 05/30/2012. Restaging CT on 08/01/2012 with no evidence of metastatic disease. -Upper endoscopy 05/08/2013 with abnormal  mucosa at the lesser curvature and no tumor mass seen, biopsy negative for malignancy  -CT 11/26/2015 revealed increased gastric wall thickening and thickening at the anterior bladder -CT 03/18/2016 revealed stable gastric wall thickening, increased size of anterior bladder wall lesion -Upper endoscopy 04/14/2016 revealed tumor involving the gastric cardia/fundus and Billroth II anastomosis, biopsies confirmed adenocarcinoma  -Palliative radiation to the stomach with radiosensitizing Xeloda 05/03/2016 through 05/14/2016 2. Remote partial gastrectomy/Billroth II anastomosis secondary to ulcer disease. 3. Iron deficiency anemia secondary to the gastric mass status post red cell transfusion 03/17/2012.  4. COPD. 5. History of colon polyps. 6. History of Chest pain/subxiphoid pain-most likely related to radiation esophagitis/gastritis or the gastric mass 7. Anorexia/weight loss-? Secondary to gastric cancer or COPD, depression 8. Depression-he started a trial of Remeron on 06/13/2012, no longer taking Remeron 9. Dyspnea/low-grade fever at an office visit on 06/20/2012-? COPD flare,? Radiation pneumonitis-he completed a trial of prednisone. The symptoms partially improved. 10. History of Orthostasis 11. History of Dysphagia-none today 12. Indeterminate 8 mm left liver lesion on the CT 08/01/2012. 13.  bladder mass confirmed on cystoscopy 04/29/2016   Disposition: [name] appears unchanged. He has completed the course of palliative radiation to the stomach. Thus far he has noted no improvement in pain. We encouraged him to take his pain medication if needed. We are hopeful that the pain will improve over the next few weeks.   We discussed systemic treatment on the FOLFOX regimen. He is not interested in further treatment.   He will return for a follow-up visit in 4 weeks. He will contact the office in  the interim with any problems.  Patient seen with Dr. Benay Spice.      Ned Card  ANP/GNP-BC   05/21/2016  3:25 PM  This was a shared visit with Ned Card. [name] completed a course of palliative Xeloda/radiation.  He will return for an office visit in 4 weeks.  Julieanne Manson, M.D.

## 2016-05-21 NOTE — Telephone Encounter (Signed)
Gave pt cal & avs °

## 2016-06-03 ENCOUNTER — Ambulatory Visit: Payer: Medicare Other | Admitting: Internal Medicine

## 2016-06-03 ENCOUNTER — Encounter: Payer: Self-pay | Admitting: Internal Medicine

## 2016-06-03 DIAGNOSIS — R9341 Abnormal radiologic findings on diagnostic imaging of renal pelvis, ureter, or bladder: Secondary | ICD-10-CM | POA: Diagnosis not present

## 2016-06-03 DIAGNOSIS — E43 Unspecified severe protein-calorie malnutrition: Secondary | ICD-10-CM

## 2016-06-03 DIAGNOSIS — C160 Malignant neoplasm of cardia: Secondary | ICD-10-CM

## 2016-06-03 DIAGNOSIS — J439 Emphysema, unspecified: Secondary | ICD-10-CM

## 2016-06-03 MED ORDER — PREDNISONE 20 MG PO TABS: 40.0000 mg | ORAL_TABLET | Freq: Every day | ORAL | 1 refills | Status: DC

## 2016-06-03 NOTE — Assessment & Plan Note (Signed)
He actually is better Not using the oxygen much now Urged him to continue the nebs and spiriva

## 2016-06-03 NOTE — Assessment & Plan Note (Signed)
Will see if he can eat after the prednisone Doesn't like supplements but asked him to consider again

## 2016-06-03 NOTE — Progress Notes (Signed)
Subjective:    Patient ID: Danny Page, male    DOB: [birth year], 81 y.o.   MRN: [id number]  HPI Home visit for follow up of multiple medical conditions Wife and son Raynard are here  Having terrible problems with his stomach since the radiation Will eat 2 bites of food--then he is full and has nausea Will have bad pain in his stomach then---finally eases up after 2 hours (nagging pain persists though) reglan and ondansetron are not helping at all He didn't want wife to call Dr Benay Spice norco helps some but he doesn't want to take  Breathing is still poor Upset about the cost of the oxygen Hasn't been using the oxygen regularly though Very little cough No fever--but will run hot and cold at times  No urinary problems No hematuria Decision made not to remove bladder lesion  Current Outpatient Prescriptions on File Prior to Visit  Medication Sig Dispense Refill  . albuterol (ACCUNEB) 1.25 MG/3ML nebulizer solution USE ONE VIAL IN NEBULIZER TWICE DAILY WITH  BUDESONIDE  AND  TWICE  DAILY  AS  NEEDED  FOR  SHORTNESS  OF  BREATH 75 vial 5  . albuterol (PROVENTIL HFA;VENTOLIN HFA) 108 (90 Base) MCG/ACT inhaler Inhale 2 puffs into the lungs every 6 (six) hours as needed for wheezing or shortness of breath. 1 Inhaler 1  . budesonide (PULMICORT) 0.25 MG/2ML nebulizer solution Take 2 mLs (0.25 mg total) by nebulization 2 (two) times daily. 60 mL 0  . HYDROcodone-acetaminophen (NORCO/VICODIN) 5-325 MG tablet Take 1 tablet by mouth every 4 (four) hours as needed for moderate pain. 30 tablet 0  . metoCLOPramide (REGLAN) 10 MG tablet Take 1 tablet (10 mg total) by mouth 4 (four) times daily. 90 tablet 1  . ondansetron (ZOFRAN) 8 MG tablet Take 1 tablet (8 mg total) by mouth every 8 (eight) hours as needed for nausea or vomiting. 30 tablet 3  . pantoprazole (PROTONIX) 40 MG tablet Take 1 tablet (40 mg total) by mouth daily. 90 tablet 3  . tiotropium (SPIRIVA) 18 MCG inhalation capsule Place 18  mcg into inhaler and inhale daily.     No current facility-administered medications on file prior to visit.     No Known Allergies  Past Medical History:  Diagnosis Date  . Anemia   . BPH (benign prostatic hypertrophy)   . CAD (coronary artery disease) 9/14   mildly abnormal perfusion study  . COPD (chronic obstructive pulmonary disease) ([place])   . Hemorrhoids   . HOH (hard of hearing)   . Muscle disease    pt denies  . Post herpetic neuralgia   . Stomach cancer ([place])    remission    Past Surgical History:  Procedure Laterality Date  . CATARACT EXTRACTION W/ INTRAOCULAR LENS IMPLANT Right 2016  . ESOPHAGOGASTRODUODENOSCOPY (EGD) WITH PROPOFOL N/A 04/14/2016   Procedure: ESOPHAGOGASTRODUODENOSCOPY (EGD) WITH PROPOFOL;  Surgeon: Jerene Bears, MD;  Location: [facility];  Service: Gastroenterology;  Laterality: N/A;  . EUS  03/30/2012   Procedure: UPPER ENDOSCOPIC ULTRASOUND (EUS) LINEAR;  Surgeon: Milus Banister, MD;  Location: [facility];  Service: Endoscopy;  Laterality: N/A;  . STOMACH SURGERY     for ulcers, age 18    Family History  Problem Relation Age of Onset  . Heart disease Brother   . Cancer Mother 9    breast ca with mets to lymph nodes  . Colon cancer Neg Hx     Social History   Social History  .  Marital status: Married    Spouse name: N/A  . Number of children: N/A  . Years of education: N/A   Occupational History  . retired, Chief Financial Officer Retired   Social History Main Topics  . Smoking status: Former Smoker    Years: 45.00    Types: Cigarettes    Quit date: 10/26/2003  . Smokeless tobacco: Never Used  . Alcohol use No     Comment: quit about 2004, heavy in the past  . Drug use: No  . Sexual activity: No   Other Topics Concern  . Not on file   Social History Narrative   No living will   Requests wife as health care POA--no set form   Requests DNR--- done 01/08/15   Would not want a feeding tube   Review of Systems C;early has lost  weight--down to 100# 2 weeks ago--but hasn't checked lately Sleeping okay No chest pain No palpitations Bowels are okay    Objective:   Physical Exam  Constitutional: No distress.  Clear muscle wasting  Neck: No thyromegaly present.  Cardiovascular: Normal rate, regular rhythm and normal heart sounds.  Exam reveals no gallop.   No murmur heard. Pulmonary/Chest: No respiratory distress. He has no wheezes. He has no rales.  Decreased breath sounds but clear  Abdominal: Soft. Bowel sounds are normal. He exhibits no distension. There is no tenderness. There is no rebound and no guarding.  scaphoid  Musculoskeletal: He exhibits no edema.  Lymphadenopathy:    He has no cervical adenopathy.  Psychiatric:  Usual stoic self and some degree of psychomotor retardation          Assessment & Plan:

## 2016-06-03 NOTE — Assessment & Plan Note (Signed)
Finished with his RT and no further treatment is planned Now having severe abdominal pain and unable to eat May be from radiation damage Antiemetics not helpful---will try course of prednisone to see if it improves his symptoms

## 2016-06-03 NOTE — Assessment & Plan Note (Signed)
No action for now about bladder lesion

## 2016-06-09 DIAGNOSIS — J449 Chronic obstructive pulmonary disease, unspecified: Secondary | ICD-10-CM | POA: Diagnosis not present

## 2016-06-10 NOTE — Progress Notes (Signed)
°  Radiation Oncology         (336) [phone] ________________________________  Name: Danny Page MRN: [id number]  Date: 05/14/2016  DOB: 20-Dec-1934  End of Treatment Note  Diagnosis:      ICD-9-CM ICD-10-CM   1. 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   Beams/energy:   3D // 10X, 15X  Narrative: The patient tolerated radiation treatment relatively well.   The patient reports poor appetite and nausea without vomiting. This nausea has been poorly managed with Zofran.   Plan: The patient has completed radiation treatment. The patient will return to radiation oncology clinic for routine followup in one month. The patient was given a prescription for Reglan to take in addition to Zofran. I advised them to call or return sooner if they have any questions or concerns related to their recovery or treatment.  ------------------------------------------------  Jodelle Gross, MD, PhD  This document serves as a record of services personally performed by Kyung Rudd, MD. It was created on his behalf by Arlyce Harman, a trained medical scribe. The creation of this record is based on the scribe's personal observations and the provider's statements to them. This document has been checked and approved by the attending provider.

## 2016-06-11 DIAGNOSIS — C169 Malignant neoplasm of stomach, unspecified: Secondary | ICD-10-CM | POA: Diagnosis not present

## 2016-06-11 DIAGNOSIS — J449 Chronic obstructive pulmonary disease, unspecified: Secondary | ICD-10-CM | POA: Diagnosis not present

## 2016-06-18 ENCOUNTER — Ambulatory Visit (HOSPITAL_BASED_OUTPATIENT_CLINIC_OR_DEPARTMENT_OTHER): Payer: Medicare Other | Admitting: Oncology

## 2016-06-18 ENCOUNTER — Telehealth: Payer: Self-pay | Admitting: Oncology

## 2016-06-18 VITALS — BP 100/51 | HR 75 | Temp 97.4°F | Resp 17 | Ht 66.0 in | Wt 99.0 lb

## 2016-06-18 DIAGNOSIS — K769 Liver disease, unspecified: Secondary | ICD-10-CM

## 2016-06-18 DIAGNOSIS — C169 Malignant neoplasm of stomach, unspecified: Secondary | ICD-10-CM | POA: Diagnosis not present

## 2016-06-18 DIAGNOSIS — N329 Bladder disorder, unspecified: Secondary | ICD-10-CM

## 2016-06-18 NOTE — Telephone Encounter (Signed)
AVS REPORT AND APPT SCHD GIVEN PER 06/18/16 LOS. °

## 2016-06-18 NOTE — Progress Notes (Signed)
  [facility] OFFICE PROGRESS NOTE   Diagnosis: Gastric cancer  INTERVAL HISTORY:   Danny Page returns as scheduled. He reports no difficulty with eating. Abdominal pain is improved. No hematuria. His wife has noted increased exertional dyspnea. He is followed by Dr. [name] with whom visits.  Objective:  Vital signs in last 24 hours:  Blood pressure (!) 100/51, pulse 75, temperature 97.4 F (36.3 C), temperature source Oral, resp. rate 17, height 5\' 6"  (1.676 m), weight 99 lb (44.9 kg).    Resp: Distant breath sounds, scattered wheeze over the left chest, no respiratory distress Cardio: Regular rate and rhythm GI: No hepatosplenomegaly, nontender, no apparent ascites Vascular: No leg edema   Lab Results:  Lab Results  Component Value Date   WBC 7.7 05/21/2016   HGB 11.3 (L) 05/21/2016   HCT 34.3 (L) 05/21/2016   MCV 81.1 05/21/2016   PLT 220 05/21/2016   NEUTROABS 5.7 05/21/2016     Medications: I have reviewed the patient's current medications.  Assessment/Plan: 1. Adenocarcinoma of the stomach. Adenocarcinoma found in the gastric remnant status post previous partial gastrectomy/Billroth II anastomosis. No evidence of metastatic disease by physical exam or CT of the abdomen/pelvis. Staging EUS on 03/30/2012 confirmed a uT3 uN0 tumor. He began radiation and concurrent Xeloda chemotherapy on 04/18/2012. Xeloda was subsequently placed on hold due to chest pain. Xeloda was resumed on 04/26/2012 with no further chest pain. The Xeloda dose was titrated to 1000 mg twice daily beginning 04/28/2012. He completed treatment on 05/30/2012. Restaging CT on 08/01/2012 with no evidence of metastatic disease. -Upper endoscopy 05/08/2013 with abnormal mucosa at the lesser curvature and no tumor mass seen, biopsy negative for malignancy  -CT 11/26/2015 revealed increased gastric wall thickening and thickening at the anterior bladder -CT 03/18/2016 revealed stable gastric wall  thickening, increased size of anterior bladder wall lesion -Upper endoscopy 04/14/2016 revealed tumor involving the gastric cardia/fundus and Billroth II anastomosis, biopsies confirmed adenocarcinoma  -Palliative radiation to the stomach with radiosensitizing Xeloda 05/03/2016 through 05/14/2016 2. Remote partial gastrectomy/Billroth II anastomosis secondary to ulcer disease. 3. Iron deficiency anemia secondary to the gastric mass status post red cell transfusion 03/17/2012.  4. COPD. 5. History of colon polyps. 6. History of Chest pain/subxiphoid pain-most likely related to radiation esophagitis/gastritis or the gastric mass 7. Anorexia/weight loss-? Secondary to gastric cancer or COPD, depression 8. Depression-he started a trial of Remeron on 06/13/2012, no longer taking Remeron 9. Dyspnea/low-grade fever at an office visit on 06/20/2012-? COPD flare,? Radiation pneumonitis-he completed a trial of prednisone. The symptoms partially improved. 10. History of Orthostasis 11. History of Dysphagia-none today 12. Indeterminate 8 mm left liver lesion on the CT 08/01/2012. 13. bladder mass confirmed on cystoscopy 04/29/2016-he declined cystoscopic resection  Disposition: Danny Page appears stable. He declines further treatment of the gastric cancer. I do not recommend restaging CTs or endoscopic evaluation. We discussed Hospice care. He declines hospice at present.  He plans to continue follow-up with Dr. [name]. He would like to [facility] at the [facility] and will return in 3 months. I am available to see him sooner as needed.    [name], MD  06/18/2016  11:24 AM

## 2016-06-22 ENCOUNTER — Ambulatory Visit: Payer: Self-pay | Admitting: Radiation Oncology

## 2016-07-05 ENCOUNTER — Telehealth: Payer: Self-pay

## 2016-07-05 NOTE — Telephone Encounter (Signed)
Left v/m requesting cb from Mrs Gonnering.

## 2016-07-05 NOTE — Telephone Encounter (Signed)
Spoke to him and wife Advised him to use the roxanol for pain I will do a home visit on Thursday

## 2016-07-05 NOTE — Telephone Encounter (Signed)
Please get more info. Has he tried the norco? Can he eat or drink anything?  I can't get out there till Thursday at the earliest

## 2016-07-05 NOTE — Telephone Encounter (Signed)
Mrs Hogenson left v/m requesting cb by Dr [name]; pt having more abdominal discomfort and breathing seems worse. Pt last home visit was 06/03/16.

## 2016-07-05 NOTE — Telephone Encounter (Signed)
Danny Page called back; pt has not tried the norco.  Pt can eat 2 bites at a time pt feels full. When drinking anything pt gets full right away. Pt is hurting all day.  Danny Page said Thursday will be OK. Danny Page request cb.

## 2016-07-06 ENCOUNTER — Ambulatory Visit: Payer: Self-pay | Admitting: Radiation Oncology

## 2016-07-08 ENCOUNTER — Ambulatory Visit: Payer: Medicare Other | Admitting: Internal Medicine

## 2016-07-08 ENCOUNTER — Encounter: Payer: Self-pay | Admitting: Internal Medicine

## 2016-07-08 VITALS — BP 88/56 | HR 100 | Resp 24

## 2016-07-08 DIAGNOSIS — C169 Malignant neoplasm of stomach, unspecified: Secondary | ICD-10-CM | POA: Diagnosis not present

## 2016-07-08 DIAGNOSIS — J439 Emphysema, unspecified: Secondary | ICD-10-CM

## 2016-07-08 DIAGNOSIS — J9611 Chronic respiratory failure with hypoxia: Secondary | ICD-10-CM | POA: Diagnosis not present

## 2016-07-08 DIAGNOSIS — E43 Unspecified severe protein-calorie malnutrition: Secondary | ICD-10-CM

## 2016-07-08 DIAGNOSIS — F339 Major depressive disorder, recurrent, unspecified: Secondary | ICD-10-CM

## 2016-07-08 MED ORDER — PREDNISONE 20 MG PO TABS: 20.0000 mg | ORAL_TABLET | Freq: Every day | ORAL | 3 refills | Status: DC

## 2016-07-08 NOTE — Assessment & Plan Note (Signed)
His mood issues and resistance to care continue to affect his care

## 2016-07-08 NOTE — Progress Notes (Signed)
Subjective:    Patient ID: Danny Page, male    DOB: 1935-07-23, 81 y.o.   MRN: [id number]  HPI Home visit for follow up  Wife is here  Seemed to improve a little with the prednisone Able to eat just a little Now the pain is worsening Again not able to eat I asked him to try roxanol and he agreed--then refused wife when I was off the phone Has not been willing to take the hydrocodone either (once since my last visit)  His breathing is also worse No longer can go to shower--wife has to sponge bathe Has been needing the oxygen regularly again since last week Still on spiriva Sent the nebulizer back---never thought it helped  Usual cough and some phlegm No fever Hoarse voice  Current Outpatient Prescriptions on File Prior to Visit  Medication Sig Dispense Refill  . albuterol (PROVENTIL HFA;VENTOLIN HFA) 108 (90 Base) MCG/ACT inhaler Inhale 2 puffs into the lungs every 6 (six) hours as needed for wheezing or shortness of breath. 1 Inhaler 1  . budesonide (PULMICORT) 0.25 MG/2ML nebulizer solution Take 2 mLs (0.25 mg total) by nebulization 2 (two) times daily. 60 mL 0  . HYDROcodone-acetaminophen (NORCO/VICODIN) 5-325 MG tablet Take 1 tablet by mouth every 4 (four) hours as needed for moderate pain. 30 tablet 0  . ondansetron (ZOFRAN) 8 MG tablet Take 1 tablet (8 mg total) by mouth every 8 (eight) hours as needed for nausea or vomiting. 30 tablet 3  . pantoprazole (PROTONIX) 40 MG tablet Take 1 tablet (40 mg total) by mouth daily. 90 tablet 3  . tiotropium (SPIRIVA) 18 MCG inhalation capsule Place 18 mcg into inhaler and inhale daily.     No current facility-administered medications on file prior to visit.     No Known Allergies  Past Medical History:  Diagnosis Date  . Anemia   . BPH (benign prostatic hypertrophy)   . CAD (coronary artery disease) 9/14   mildly abnormal perfusion study  . COPD (chronic obstructive pulmonary disease) ([place])   . Hemorrhoids   . HOH (hard  of hearing)   . Muscle disease    pt denies  . Post herpetic neuralgia   . Stomach cancer ([place])    remission    Past Surgical History:  Procedure Laterality Date  . CATARACT EXTRACTION W/ INTRAOCULAR LENS IMPLANT Right 2016  . ESOPHAGOGASTRODUODENOSCOPY (EGD) WITH PROPOFOL N/A 04/14/2016   Procedure: ESOPHAGOGASTRODUODENOSCOPY (EGD) WITH PROPOFOL;  Surgeon: Jerene Bears, MD;  Location: [facility];  Service: Gastroenterology;  Laterality: N/A;  . EUS  03/30/2012   Procedure: UPPER ENDOSCOPIC ULTRASOUND (EUS) LINEAR;  Surgeon: Milus Banister, MD;  Location: [facility];  Service: Endoscopy;  Laterality: N/A;  . STOMACH SURGERY     for ulcers, age 59    Family History  Problem Relation Age of Onset  . Heart disease Brother   . Cancer Mother 12    breast ca with mets to lymph nodes  . Colon cancer Neg Hx     Social History   Social History  . Marital status: Married    Spouse name: N/A  . Number of children: N/A  . Years of education: N/A   Occupational History  . retired, Chief Financial Officer Retired   Social History Main Topics  . Smoking status: Former Smoker    Years: 45.00    Types: Cigarettes    Quit date: 10/26/2003  . Smokeless tobacco: Never Used  . Alcohol use No  Comment: quit about 2004, heavy in the past  . Drug use: No  . Sexual activity: No   Other Topics Concern  . Not on file   Social History Narrative   No living will   Requests wife as health care POA--no set form   Requests DNR--- done 01/08/15   Would not want a feeding tube   Review of Systems No trouble voiding. No hematuria Bowels are okay--but generally slow (with limited eating) Able to sleep Has clearly lost more weight    Objective:   Physical Exam  Constitutional:  Clear wasting Some dyspnea just at rest  Neck: No thyromegaly present.  Cardiovascular: Normal rate, regular rhythm and normal heart sounds.  Exam reveals no gallop.   No murmur heard. Pulmonary/Chest: He has no wheezes.  He has no rales.  Almost no air movement but is not tight  Abdominal: Soft. He exhibits no distension. There is no tenderness.  Musculoskeletal: He exhibits no edema.  Lymphadenopathy:    He has no cervical adenopathy.  Psychiatric: He has a normal mood and affect. His behavior is normal.          Assessment & Plan:

## 2016-07-08 NOTE — Assessment & Plan Note (Signed)
Worse again Using the oxygen all the time--but still dyspneic

## 2016-07-08 NOTE — Assessment & Plan Note (Signed)
Has lost more weight  Hopefully he will be able to eat more on the prednisone

## 2016-07-08 NOTE — Assessment & Plan Note (Signed)
Has clearly worsened in the past couple of weeks Will restart the prednisone--may help stomach but clearly seemed to help his breathing Not willing to start hospice yet

## 2016-07-08 NOTE — Assessment & Plan Note (Signed)
Ongoing symptoms Continues to be resistant to symptomatic treatment Discussed hospice--doesn't want this now Discussed with him and wife ---she needs help (they should hire aides)  Will try the prednisone again since he was able to eat better with that Consider retrying the metoclopramide

## 2016-07-10 DIAGNOSIS — J449 Chronic obstructive pulmonary disease, unspecified: Secondary | ICD-10-CM | POA: Diagnosis not present

## 2016-07-13 ENCOUNTER — Telehealth: Payer: Self-pay | Admitting: Internal Medicine

## 2016-07-13 NOTE — Telephone Encounter (Signed)
I faxed the last 2 office notes and demographics to [place] at [facility].

## 2016-07-13 NOTE — Telephone Encounter (Signed)
Hospice referral made to [facility]  Please fax demographics and my last 2 notes to [facility]

## 2016-07-13 NOTE — Telephone Encounter (Signed)
Wife called - she would like to have hospice services for pt . Please call cell number at [phone] Thanks

## 2016-07-14 ENCOUNTER — Telehealth: Payer: Self-pay | Admitting: Oncology

## 2016-07-14 NOTE — Telephone Encounter (Signed)
Danny Page called to cancel the patient's 09/21 appointment per the patient is in Hospice care. The patient would like to keep the November appointment.

## 2016-07-15 ENCOUNTER — Ambulatory Visit: Admission: RE | Admit: 2016-07-15 | Payer: Medicare Other | Source: Ambulatory Visit | Admitting: Radiation Oncology

## 2016-07-15 DIAGNOSIS — J439 Emphysema, unspecified: Secondary | ICD-10-CM | POA: Diagnosis not present

## 2016-07-15 DIAGNOSIS — C160 Malignant neoplasm of cardia: Secondary | ICD-10-CM | POA: Diagnosis not present

## 2016-07-15 DIAGNOSIS — J9611 Chronic respiratory failure with hypoxia: Secondary | ICD-10-CM | POA: Diagnosis not present

## 2016-07-15 DIAGNOSIS — J449 Chronic obstructive pulmonary disease, unspecified: Secondary | ICD-10-CM | POA: Diagnosis not present

## 2016-07-22 ENCOUNTER — Telehealth: Payer: Self-pay

## 2016-07-22 NOTE — Telephone Encounter (Signed)
Spoke to [place]. She will let Mrs. Bagent know.

## 2016-07-22 NOTE — Telephone Encounter (Signed)
Actually, probably a good idea to stop it. Tell her thanks

## 2016-07-22 NOTE — Telephone Encounter (Signed)
Danny [facility] with [facility]  left v/m; pt has gastric CA and pt is on ASA 81 mg and  Danny Page wants to know if Dr [name] wants pt to continue taking the ASA. Danny Page request cb.

## 2016-08-04 ENCOUNTER — Telehealth: Payer: Self-pay | Admitting: Internal Medicine

## 2016-08-04 NOTE — Telephone Encounter (Signed)
Vicky with [facility] called -  Would you consider a long acting pain medication for this pt? What are your thoughts about fentanyl patch? He is taking hydrocodone more often.  cb number is [phone] Thanks

## 2016-08-05 NOTE — Telephone Encounter (Signed)
He has been very resistant to many meds. If he is taking the hydrocodone at least 3 times a day and he will consider, I would try the 1mcg patch (I would check with him and wife personally first though)

## 2016-08-05 NOTE — Telephone Encounter (Signed)
Spoke to Pt and his wife. Explained that the patch would stay on for a few days and would provide a steady amount of pain medication and the hydrocodone would help more with breakthrough pain. The pt is not interested right now. They do have morphine if needed.  Iva Lento they appreciated her concern. May change their minds later.

## 2016-09-17 ENCOUNTER — Ambulatory Visit: Payer: Medicare Other | Admitting: Internal Medicine

## 2016-09-17 ENCOUNTER — Encounter: Payer: Self-pay | Admitting: Internal Medicine

## 2016-09-17 VITALS — BP 90/58 | HR 76 | Resp 24 | Wt 92.0 lb

## 2016-09-17 DIAGNOSIS — J439 Emphysema, unspecified: Secondary | ICD-10-CM

## 2016-09-17 DIAGNOSIS — J9611 Chronic respiratory failure with hypoxia: Secondary | ICD-10-CM | POA: Diagnosis not present

## 2016-09-17 DIAGNOSIS — C160 Malignant neoplasm of cardia: Secondary | ICD-10-CM | POA: Diagnosis not present

## 2016-09-17 DIAGNOSIS — E43 Unspecified severe protein-calorie malnutrition: Secondary | ICD-10-CM

## 2016-09-17 DIAGNOSIS — F339 Major depressive disorder, recurrent, unspecified: Secondary | ICD-10-CM | POA: Diagnosis not present

## 2016-09-17 MED ORDER — PANTOPRAZOLE SODIUM 40 MG PO TBEC: 40.0000 mg | DELAYED_RELEASE_TABLET | Freq: Every day | ORAL | 3 refills | Status: DC

## 2016-09-17 NOTE — Assessment & Plan Note (Signed)
Good sats on the oxygen Uses it all the time

## 2016-09-17 NOTE — Assessment & Plan Note (Addendum)
Ongoing postprandial pain and early satiety Better back on the prednisone and hydrocodone before meals Weight up 10# Asked him to restart the protonix--just in case it can reduce some of his chronic gastric irritation

## 2016-09-17 NOTE — Assessment & Plan Note (Signed)
Some better on the prednisone again (helps breathing and postprandial pain)

## 2016-09-17 NOTE — Assessment & Plan Note (Signed)
Can't even walk 10 feet in house without dyspnea No longer able to do personal care---has been reluctant to accept any help from hospice Will see if Danny Page can get a male aide for him (be he is refusing here)

## 2016-09-17 NOTE — Assessment & Plan Note (Signed)
Still depressed but he is stoic and hasn't taken meds This doesn't seem to be a limiting factor for now

## 2016-09-17 NOTE — Progress Notes (Signed)
Subjective:    Patient ID: Danny Page, male    DOB: 1935-06-14, 81 y.o.   MRN: [id number]  HPI Home visit for follow up of stomach cancer and COPD Wife is here  Still having bad problems with breathing On oxygen all the time Even takes a lot of energy just to move bowels Wife now giving sponge bath---too much energy for shower (even with help) Has been on the prednisone  Eating more--but still very little Fills up quickly and some post prandial pain still Does take the norco before eating--this seems to help the pain some  No cough or fever Some chest congestion--- mucinex given by Casilda Carls from hospice Wife notes grunting Won't take the morphine--but will take the norco prn Stopped the nebulizer and sent it back---really didn't think it helped Using spiriva but not really the albuterol  Stopped the protonix due to "no heartburn" Discussed restarting --in case it might help the stomach pain  Mood is about the same "grouchy" per wife She went to [facility] for Thanksgiving--but he didn't feel well enough to go  Current Outpatient Prescriptions on File Prior to Visit  Medication Sig Dispense Refill  . albuterol (PROVENTIL HFA;VENTOLIN HFA) 108 (90 Base) MCG/ACT inhaler Inhale 2 puffs into the lungs every 6 (six) hours as needed for wheezing or shortness of breath. 1 Inhaler 1  . HYDROcodone-acetaminophen (NORCO/VICODIN) 5-325 MG tablet Take 1 tablet by mouth every 4 (four) hours as needed for moderate pain. 30 tablet 0  . ondansetron (ZOFRAN) 8 MG tablet Take 1 tablet (8 mg total) by mouth every 8 (eight) hours as needed for nausea or vomiting. 30 tablet 3  . predniSONE (DELTASONE) 20 MG tablet Take 1 tablet (20 mg total) by mouth daily with breakfast. 30 tablet 3  . tiotropium (SPIRIVA) 18 MCG inhalation capsule Place 18 mcg into inhaler and inhale daily.     No current facility-administered medications on file prior to visit.     No Known Allergies  Past Medical History:   Diagnosis Date  . Anemia   . BPH (benign prostatic hypertrophy)   . CAD (coronary artery disease) 9/14   mildly abnormal perfusion study  . COPD (chronic obstructive pulmonary disease) ([place])   . Hemorrhoids   . HOH (hard of hearing)   . Muscle disease    pt denies  . Post herpetic neuralgia   . Stomach cancer ([place])    remission    Past Surgical History:  Procedure Laterality Date  . CATARACT EXTRACTION W/ INTRAOCULAR LENS IMPLANT Right 2016  . ESOPHAGOGASTRODUODENOSCOPY (EGD) WITH PROPOFOL N/A 04/14/2016   Procedure: ESOPHAGOGASTRODUODENOSCOPY (EGD) WITH PROPOFOL;  Surgeon: Jerene Bears, MD;  Location: [facility];  Service: Gastroenterology;  Laterality: N/A;  . EUS  03/30/2012   Procedure: UPPER ENDOSCOPIC ULTRASOUND (EUS) LINEAR;  Surgeon: Milus Banister, MD;  Location: [facility];  Service: Endoscopy;  Laterality: N/A;  . STOMACH SURGERY     for ulcers, age 46    Family History  Problem Relation Age of Onset  . Heart disease Brother   . Cancer Mother 79    breast ca with mets to lymph nodes  . Colon cancer Neg Hx     Social History   Social History  . Marital status: Married    Spouse name: N/A  . Number of children: N/A  . Years of education: N/A   Occupational History  . retired, Chief Financial Officer Retired   Social History Main Topics  . Smoking status:  Former Smoker    Years: 45.00    Types: Cigarettes    Quit date: 10/26/2003  . Smokeless tobacco: Never Used  . Alcohol use No     Comment: quit about 2004, heavy in the past  . Drug use: No  . Sexual activity: No   Other Topics Concern  . Not on file   Social History Narrative   No living will   Requests wife as health care POA--no set form   Requests DNR--- done 01/08/15   Would not want a feeding tube   Review of Systems Weight up 10# from nadir Sleeping okay Bowels are moving okay Some trouble voiding-- has to push to empty No skin ulcers---just dry skin and tough Memory has been worsening      Objective:   Physical Exam  Constitutional: He appears well-developed. No distress.  Neck: No thyromegaly present.  Cardiovascular: Normal rate and regular rhythm.  Exam reveals no gallop and no friction rub.   No murmur heard. Distant heart sounds but regular   Pulmonary/Chest:  Very little air movement Expiratory phase is not prolonged Rare expiratory wheeze  Abdominal: Soft. He exhibits no distension. There is no tenderness.  Musculoskeletal: He exhibits no edema.  Lymphadenopathy:    He has no cervical adenopathy.  Psychiatric:  Not overly depressed now          Assessment & Plan:

## 2016-09-20 ENCOUNTER — Ambulatory Visit: Payer: Medicare Other | Admitting: Oncology

## 2016-10-12 ENCOUNTER — Telehealth: Payer: Self-pay

## 2016-10-12 NOTE — Telephone Encounter (Signed)
Danny Page with [facility] left v/m requesting verbal order for nebulizer; pt has refused in the past but Danny Page wants to try again; difficult for pt to walk and sats are not down that low so hates to bump up oxygen. Please advise.

## 2016-10-12 NOTE — Telephone Encounter (Signed)
That is fine He has sent it back in the past but worth trying again. I had prescribed budesonide (0.5mg  bid) as well as prn albuterol

## 2016-10-12 NOTE — Telephone Encounter (Signed)
Spoke to [place].

## 2016-10-13 DIAGNOSIS — J9611 Chronic respiratory failure with hypoxia: Secondary | ICD-10-CM | POA: Diagnosis not present

## 2016-10-13 DIAGNOSIS — C160 Malignant neoplasm of cardia: Secondary | ICD-10-CM | POA: Diagnosis not present

## 2016-10-13 DIAGNOSIS — J449 Chronic obstructive pulmonary disease, unspecified: Secondary | ICD-10-CM | POA: Diagnosis not present

## 2016-10-13 DIAGNOSIS — J439 Emphysema, unspecified: Secondary | ICD-10-CM | POA: Diagnosis not present

## 2016-11-26 ENCOUNTER — Other Ambulatory Visit: Payer: Self-pay | Admitting: Internal Medicine

## 2016-11-27 ENCOUNTER — Other Ambulatory Visit: Payer: Self-pay | Admitting: Internal Medicine

## 2016-11-29 NOTE — Telephone Encounter (Signed)
Mrs Deruiter request status of spiriva rx. Advised already sent to [facility] [address]. She will ck with phamracy later today.

## 2016-11-29 NOTE — Telephone Encounter (Signed)
Approved: okay to refill #30 x 0 1 bid prn dyspnea or anxiety

## 2016-11-29 NOTE — Telephone Encounter (Signed)
Pt's wife called. She is assuming hospice requested the medication. She is not for sure if he is on it or not, but said she thought Dr [name] placed him on a medication for anxiety.  She also asked when was Dr [name] planning his next home visit.

## 2016-11-29 NOTE — Telephone Encounter (Signed)
We received a refill request for lorazepam #30. It had no last refill date and the medication was not on his list. His last OV was 09-17-16. I know he is Hospice, so I was unsure if there were orders for this but not in the computer. Left messages on both phone numbers to try and find out about the refill. Do you know if he is on it?

## 2016-12-02 ENCOUNTER — Encounter: Payer: Self-pay | Admitting: Internal Medicine

## 2016-12-02 ENCOUNTER — Ambulatory Visit: Payer: Medicare Other | Admitting: Internal Medicine

## 2016-12-02 VITALS — BP 118/60 | HR 96 | Resp 26 | Wt 88.0 lb

## 2016-12-02 DIAGNOSIS — E43 Unspecified severe protein-calorie malnutrition: Secondary | ICD-10-CM

## 2016-12-02 DIAGNOSIS — J9611 Chronic respiratory failure with hypoxia: Secondary | ICD-10-CM

## 2016-12-02 DIAGNOSIS — F39 Unspecified mood [affective] disorder: Secondary | ICD-10-CM | POA: Insufficient documentation

## 2016-12-02 DIAGNOSIS — C169 Malignant neoplasm of stomach, unspecified: Secondary | ICD-10-CM

## 2016-12-02 DIAGNOSIS — J439 Emphysema, unspecified: Secondary | ICD-10-CM | POA: Diagnosis not present

## 2016-12-02 DIAGNOSIS — F339 Major depressive disorder, recurrent, unspecified: Secondary | ICD-10-CM | POA: Diagnosis not present

## 2016-12-02 NOTE — Assessment & Plan Note (Signed)
On the oxygen 24/7

## 2016-12-02 NOTE — Assessment & Plan Note (Signed)
Hard to tell its activity Can't eat more that a few bites--- may be related to past surgery and RT The prednisone seems to have helped this pain but he is still unable to eat and has lost weight

## 2016-12-02 NOTE — Assessment & Plan Note (Signed)
Ongoing weight loss despite the prednisone and wife's diligent efforts

## 2016-12-02 NOTE — Assessment & Plan Note (Signed)
Has not responded to meds and doesn't want to take anything Suffering with his condition and ready to die---but not suicidal

## 2016-12-02 NOTE — Progress Notes (Signed)
Subjective:    Patient ID: Danny Page, male    DOB: 02/01/1935, 82 y.o.   MRN: [id number]  HPI Home visit to review his multiple chronic medical conditions Wife is here Andrea--hospice RN is here Raquel Sarna --Education officer, museum is here also  He feels that his breathing is better since restarting the spiriva Is using nebulizer with budesonide and albuterol bid Ongoing easy dyspnea-- any walking (like walking a few steps) Needs assist for bathing--wife helps with sponge bath or occasional shower Regular cough Doesn't use the roxanol--but has it here just in case  No regular abdominal pain Did have a bad spell a few weeks ago--usually after eating Gets dyspnea right after eating-- he calls it "suffocation" Still seems to be eating better on the prednisone  He thinks his mood is okay States he will be happy "to go"--but no true suicidal ideation Frequent anxiety--mostly related to his dyspnea He does take the lorazepam at times for this and it seems to help some  Current Outpatient Prescriptions on File Prior to Visit  Medication Sig Dispense Refill  . albuterol (PROVENTIL HFA;VENTOLIN HFA) 108 (90 Base) MCG/ACT inhaler Inhale 2 puffs into the lungs every 6 (six) hours as needed for wheezing or shortness of breath. 1 Inhaler 1  . HYDROcodone-acetaminophen (NORCO/VICODIN) 5-325 MG tablet Take 1 tablet by mouth every 4 (four) hours as needed for moderate pain. 30 tablet 0  . LORazepam (ATIVAN) 0.5 MG tablet TAKE ONE TABLET BY MOUTH EVERY 4 TO 6 HOURS AS NEEDED FOR SHORTNESS OF BREATH/ANXIETY 30 tablet 2  . ondansetron (ZOFRAN) 8 MG tablet Take 1 tablet (8 mg total) by mouth every 8 (eight) hours as needed for nausea or vomiting. 30 tablet 3  . pantoprazole (PROTONIX) 40 MG tablet Take 1 tablet (40 mg total) by mouth daily. 90 tablet 3  . predniSONE (DELTASONE) 20 MG tablet Take 1 tablet (20 mg total) by mouth daily with breakfast. 30 tablet 3  . SPIRIVA HANDIHALER 18 MCG inhalation capsule  INHALE ONE DOSE BY MOUTH ONCE DAILY 30 capsule 11  . tiotropium (SPIRIVA) 18 MCG inhalation capsule Place 18 mcg into inhaler and inhale daily.     No current facility-administered medications on file prior to visit.     No Known Allergies  Past Medical History:  Diagnosis Date  . Anemia   . BPH (benign prostatic hypertrophy)   . CAD (coronary artery disease) 9/14   mildly abnormal perfusion study  . COPD (chronic obstructive pulmonary disease) ([place])   . Hemorrhoids   . HOH (hard of hearing)   . Muscle disease    pt denies  . Post herpetic neuralgia   . Stomach cancer ([place])    remission    Past Surgical History:  Procedure Laterality Date  . CATARACT EXTRACTION W/ INTRAOCULAR LENS IMPLANT Right 2016  . ESOPHAGOGASTRODUODENOSCOPY (EGD) WITH PROPOFOL N/A 04/14/2016   Procedure: ESOPHAGOGASTRODUODENOSCOPY (EGD) WITH PROPOFOL;  Surgeon: Jerene Bears, MD;  Location: [facility];  Service: Gastroenterology;  Laterality: N/A;  . EUS  03/30/2012   Procedure: UPPER ENDOSCOPIC ULTRASOUND (EUS) LINEAR;  Surgeon: Milus Banister, MD;  [facility];  Service: Endoscopy;  Laterality: N/A;  . STOMACH SURGERY     for ulcers, age 75    Family History  Problem Relation Age of Onset  . Heart disease Brother   . Cancer Mother 42    breast ca with mets to lymph nodes  . Colon cancer Neg Hx  Social History   Social History  . Marital status: Married    Spouse name: N/A  . Number of children: N/A  . Years of education: N/A   Occupational History  . retired, Chief Financial Officer Retired   Social History Main Topics  . Smoking status: Former Smoker    Years: 45.00    Types: Cigarettes    Quit date: 10/26/2003  . Smokeless tobacco: Never Used  . Alcohol use No     Comment: quit about 2004, heavy in the past  . Drug use: No  . Sexual activity: No   Other Topics Concern  . Not on file   Social History Narrative   No living will   Requests wife as health care POA--no set form    Requests DNR--- done 01/08/15   Would not want a feeding tube   Review of Systems No fever but still gets hot and cold Appetite is still poor---gets full after only a few . Weight down again--still higher than nadir before prednisone Bowels are okay No trouble voiding No skin ulcers    Objective:   Physical Exam  Constitutional: No distress.  Cardiovascular: Normal rate and regular rhythm.  Exam reveals no gallop.   No murmur heard. Pulmonary/Chest: He has no wheezes. He has no rales.  Slightly labored at rest Very little air movement but not tight  Abdominal: There is no tenderness.  scaphoid  Musculoskeletal: He exhibits no edema.  Lymphadenopathy:    He has no cervical adenopathy.  Psychiatric:  Usual dry self No overt depression          Assessment & Plan:

## 2016-12-02 NOTE — Assessment & Plan Note (Signed)
Anxiety seems mostly related to his dyspnea Some relief with the lorazepam

## 2016-12-02 NOTE — Assessment & Plan Note (Addendum)
Severe  Unable to walk even a few steps without dyspnea Needs help with most care Somewhat better on daily prednisone and back on the spiriva Still losing weight though--has lost another 4# since my last visit

## 2017-01-11 DIAGNOSIS — J9611 Chronic respiratory failure with hypoxia: Secondary | ICD-10-CM | POA: Diagnosis not present

## 2017-01-11 DIAGNOSIS — C160 Malignant neoplasm of cardia: Secondary | ICD-10-CM | POA: Diagnosis not present

## 2017-01-11 DIAGNOSIS — J449 Chronic obstructive pulmonary disease, unspecified: Secondary | ICD-10-CM | POA: Diagnosis not present

## 2017-01-11 DIAGNOSIS — J439 Emphysema, unspecified: Secondary | ICD-10-CM | POA: Diagnosis not present

## 2017-01-28 ENCOUNTER — Encounter: Payer: Self-pay | Admitting: Internal Medicine

## 2017-01-28 ENCOUNTER — Ambulatory Visit: Payer: Medicare Other | Admitting: Internal Medicine

## 2017-01-28 VITALS — BP 94/50 | HR 88 | Resp 22 | Wt 84.0 lb

## 2017-01-28 DIAGNOSIS — J439 Emphysema, unspecified: Secondary | ICD-10-CM

## 2017-01-28 DIAGNOSIS — E43 Unspecified severe protein-calorie malnutrition: Secondary | ICD-10-CM | POA: Diagnosis not present

## 2017-01-28 DIAGNOSIS — C169 Malignant neoplasm of stomach, unspecified: Secondary | ICD-10-CM

## 2017-01-28 DIAGNOSIS — F339 Major depressive disorder, recurrent, unspecified: Secondary | ICD-10-CM

## 2017-01-28 MED ORDER — MORPHINE SULFATE ER 15 MG PO T12A: 15.0000 mg | EXTENDED_RELEASE_TABLET | Freq: Two times a day (BID) | ORAL | 0 refills | Status: DC

## 2017-01-28 MED ORDER — OMEPRAZOLE 20 MG PO CPDR
20.0000 mg | DELAYED_RELEASE_CAPSULE | Freq: Every day | ORAL | 11 refills | Status: AC
Start: 2017-01-28 — End: ?

## 2017-01-28 MED ORDER — PREDNISONE 20 MG PO TABS: 20.0000 mg | ORAL_TABLET | Freq: Every day | ORAL | 11 refills | Status: DC

## 2017-01-28 MED ORDER — GUAIFENESIN ER 600 MG PO TB12: 600.0000 mg | ORAL_TABLET | Freq: Two times a day (BID) | ORAL | 3 refills | Status: AC

## 2017-01-28 NOTE — Assessment & Plan Note (Signed)
Still depressed and talks about dying Never had suicidal ideation Has not taken or responded well to meds consisitently in past

## 2017-01-28 NOTE — Progress Notes (Signed)
Subjective:    Patient ID: Danny Page, male    DOB: 1935/02/08, 82 y.o.   MRN: [id number]  HPI Home visit for follow up of chronic medical problems Wife is here Reviewed status with Seth Bake RN yesterday  Stomach has gone back to hurting--- norco helps some Still takes the prednisone No recent nausea or vomiting Does get some increased pain post prandial  Tried the nebulizer---didn't really help He went back to the spiriva --though they have to apy for it Never takes off the oxygne albe to walk short distances in house--but can no longer go out at all Very hard to even take shower--has to sit in chair Still uses the budesonide--but he doesn't think it helps  Not really coughing  Depressed--- not clearly worse Now more confused Talking to people who are dead,   Started on furosemide a while back for sig swelling in legs This has helped Still swells daily but better with the med  Current Outpatient Prescriptions on File Prior to Visit  Medication Sig Dispense Refill  . budesonide (PULMICORT) 0.5 MG/2ML nebulizer solution Take 0.5 mg by nebulization 2 (two) times daily. With albuterol    . HYDROcodone-acetaminophen (NORCO/VICODIN) 5-325 MG tablet Take 1 tablet by mouth every 4 (four) hours as needed for moderate pain. 30 tablet 0  . LORazepam (ATIVAN) 0.5 MG tablet TAKE ONE TABLET BY MOUTH EVERY 4 TO 6 HOURS AS NEEDED FOR SHORTNESS OF BREATH/ANXIETY 30 tablet 2  . ondansetron (ZOFRAN) 8 MG tablet Take 1 tablet (8 mg total) by mouth every 8 (eight) hours as needed for nausea or vomiting. 30 tablet 3  . pantoprazole (PROTONIX) 40 MG tablet Take 1 tablet (40 mg total) by mouth daily. 90 tablet 3  . predniSONE (DELTASONE) 20 MG tablet Take 1 tablet (20 mg total) by mouth daily with breakfast. 30 tablet 3  . tiotropium (SPIRIVA) 18 MCG inhalation capsule Place 18 mcg into inhaler and inhale daily.     No current facility-administered medications on file prior to visit.     No  Known Allergies  Past Medical History:  Diagnosis Date  . Anemia   . BPH (benign prostatic hypertrophy)   . CAD (coronary artery disease) 9/14   mildly abnormal perfusion study  . COPD (chronic obstructive pulmonary disease) ([place])   . Hemorrhoids   . HOH (hard of hearing)   . Muscle disease    pt denies  . Post herpetic neuralgia   . Stomach cancer ([place])    remission    Past Surgical History:  Procedure Laterality Date  . CATARACT EXTRACTION W/ INTRAOCULAR LENS IMPLANT Right 2016  . ESOPHAGOGASTRODUODENOSCOPY (EGD) WITH PROPOFOL N/A 04/14/2016   Procedure: ESOPHAGOGASTRODUODENOSCOPY (EGD) WITH PROPOFOL;  Surgeon: Jerene Bears, MD;  Location: [facility];  Service: Gastroenterology;  Laterality: N/A;  . EUS  03/30/2012   Procedure: UPPER ENDOSCOPIC ULTRASOUND (EUS) LINEAR;  Surgeon: Milus Banister, MD;  [facility];  Service: Endoscopy;  Laterality: N/A;  . STOMACH SURGERY     for ulcers, age 66    Family History  Problem Relation Age of Onset  . Heart disease Brother   . Cancer Mother 87    breast ca with mets to lymph nodes  . Colon cancer Neg Hx     Social History   Social History  . Marital status: Married    Spouse name: N/A  . Number of children: N/A  . Years of education: N/A   Occupational History  .  retired, Chief Financial Officer Retired   Social History Main Topics  . Smoking status: Former Smoker    Years: 45.00    Types: Cigarettes    Quit date: 10/26/2003  . Smokeless tobacco: Never Used  . Alcohol use No     Comment: quit about 2004, heavy in the past  . Drug use: No  . Sexual activity: No   Other Topics Concern  . Not on file   Social History Narrative   No living will   Requests wife as health care POA--no set form   Requests DNR--- done 01/08/15   Would not want a feeding tube   Review of Systems Is eating--likes mac and cheese with bacon Has lost more weight Wife still gets food from [facility] at times Sleeps okay Bowels  are okay Slow stream-- he has to push it out (but does feel like he empties)    Objective:   Physical Exam  Constitutional: No distress.  Sig wasting  Neck: No thyromegaly present.  Cardiovascular: Normal rate and regular rhythm.  Exam reveals no gallop.   No murmur heard. Pulmonary/Chest: He has no wheezes. He has no rales.  Almost no air movement but not tight  Abdominal: He exhibits no distension. There is no tenderness.  Musculoskeletal: He exhibits no edema.  Lymphadenopathy:    He has no cervical adenopathy.          Assessment & Plan:

## 2017-01-28 NOTE — Assessment & Plan Note (Signed)
More pain and progressive weight loss despite eating fairly well Continues on hospice Will see if he will try MS Contin for pain and breathing

## 2017-01-28 NOTE — Assessment & Plan Note (Signed)
Continue the prednisone No effect of mirtazapine in past

## 2017-01-28 NOTE — Assessment & Plan Note (Signed)
Severe Nebs not really helping--he is going to hold off on this Continue the prednisone and oxygen Will add morphine in hopes it will help his dyspnea

## 2017-01-31 ENCOUNTER — Telehealth: Payer: Self-pay

## 2017-01-31 NOTE — Telephone Encounter (Signed)
PLEASE NOTE: All timestamps contained within this report are represented as [place] Standard Time. CONFIDENTIALTY NOTICE: This fax transmission is intended only for the addressee. It contains information that is legally privileged, confidential or otherwise protected from use or disclosure. If you are not the intended recipient, you are strictly prohibited from reviewing, disclosing, copying using or disseminating any of this information or taking any action in reliance on or regarding this information. If you have received this fax in error, please notify us immediately by telephone so that we can arrange for its return to [place]. Phone: [phone], Toll-Free: [phone], Fax: [phone] Page: 1 of 1 Call Id: [id number] [place] Night - Client Nonclinical Telephone Record [facility] Night - Client Client Site [place] Physician Viviana Simpler - MD Contact Type Call Who Is Calling Physician / Provider / Hospital Call Type Provider Call [facility] Page Now Reason for Call Request to speak to Physician Initial Comment Needing to speak with oncall regarding hospice patient Additional Comment Patient Name Danny Page Patient DOB 08/14/1935 Requesting Provider Lorelle Formosa Physician Number [phone] Facility Name [facility] Phone DateTime Result/Outcome Message Type Notes Garret Reddish - MD [phone] 01/28/2017 8:12:26 PM Called On Call Provider - Reached Doctor Paged Garret Reddish - MD 01/28/2017 8:12:47 PM Spoke with On Call - General Message Result Spoke with on call and connected with hospice. Call Closed By: Deloria Lair Transaction Date/Time: 01/28/2017 8:02:11 PM (ET)

## 2017-01-31 NOTE — Telephone Encounter (Signed)
Please check on him and find out how he is doing.

## 2017-01-31 NOTE — Telephone Encounter (Signed)
Left a message to call office

## 2017-02-01 ENCOUNTER — Telehealth: Payer: Self-pay | Admitting: Internal Medicine

## 2017-02-01 NOTE — Telephone Encounter (Signed)
Patient's spouse brought in a Recertification Form to be filled out by the PCP.  Form was put in the PCP's incoming prescription box.

## 2017-02-01 NOTE — Telephone Encounter (Signed)
Patient's wife returned Danny Page's call.  She can be reached at [phone].

## 2017-02-01 NOTE — Telephone Encounter (Signed)
Spoke to pt's wife. She said he was fine and was unsure why they had called [place].

## 2017-02-02 NOTE — Telephone Encounter (Signed)
Forms placed in Dr Letvak's Inbox on his desk 

## 2017-02-02 NOTE — Telephone Encounter (Signed)
I spoke to patient's wife and let her know form was completed and faxed to [employer].

## 2017-02-02 NOTE — Telephone Encounter (Signed)
Form done No charge 

## 2017-02-11 ENCOUNTER — Other Ambulatory Visit: Payer: Self-pay

## 2017-02-11 MED ORDER — MORPHINE SULFATE ER 15 MG PO T12A: 15.0000 mg | EXTENDED_RELEASE_TABLET | Freq: Two times a day (BID) | ORAL | 0 refills | Status: DC

## 2017-02-11 NOTE — Telephone Encounter (Signed)
I did not receive a printed Rx on this patient.

## 2017-02-11 NOTE — Telephone Encounter (Signed)
Vicky nurse with [facility] left v/m requesting rx morphine faxed to [facility] [address] Hospice pt. Pt last seen and rx last printed # 30 on 01/28/17.Dr [name] out of office.Vicky request cb when done.

## 2017-02-11 NOTE — Telephone Encounter (Signed)
Printed.  Thanks.  

## 2017-02-14 MED ORDER — MORPHINE SULFATE ER 15 MG PO T12A: 15.0000 mg | EXTENDED_RELEASE_TABLET | Freq: Two times a day (BID) | ORAL | 0 refills | Status: AC

## 2017-02-14 NOTE — Telephone Encounter (Signed)
Printed.  Thanks.  

## 2017-02-14 NOTE — Telephone Encounter (Signed)
Rx faxed to pharmacy  

## 2017-02-16 ENCOUNTER — Telehealth: Payer: Self-pay

## 2017-02-16 ENCOUNTER — Other Ambulatory Visit: Payer: Self-pay | Admitting: Internal Medicine

## 2017-02-16 NOTE — Telephone Encounter (Signed)
Vicky with [facility] left v/m;when Vicky saw pt yesterday pt was more irritable than usual. Vicky wonders if Dr [name] would consider med for depression or mood all over the place; pt is more irritable and more forgetful. Vicky request cb.

## 2017-02-16 NOTE — Telephone Encounter (Signed)
Last filled 02-01-17 #14.  Was not sure if he needed a refill

## 2017-02-16 NOTE — Telephone Encounter (Signed)
Approved: #30 x 3 He is going to continue this daily

## 2017-02-17 MED ORDER — SERTRALINE HCL 25 MG PO TABS: 25.0000 mg | ORAL_TABLET | Freq: Every day | ORAL | 5 refills | Status: AC

## 2017-02-17 NOTE — Telephone Encounter (Signed)
Left a message for Danny Page letting her know I sent in sertraline 25mg  for him to try

## 2017-02-17 NOTE — Telephone Encounter (Signed)
Please let her know that he has generally refused any such meds----and not done well the few times he has tried. I would encourage the morphine as that can give relief of his dyspnea (and I am not sure he is taking that either)

## 2017-02-17 NOTE — Telephone Encounter (Signed)
Okay to start sertraline 25mg  daily  Send Rx and let her know

## 2017-02-17 NOTE — Telephone Encounter (Signed)
Danny Page returned your call [facility] number [phone]

## 2017-02-17 NOTE — Telephone Encounter (Signed)
Left detailed message on VM for [facility]

## 2017-02-17 NOTE — Addendum Note (Signed)
Addended by: Pilar Grammes on: 02/17/2017 01:24 PM   Modules accepted: Orders

## 2017-02-17 NOTE — Telephone Encounter (Signed)
Spoke to [place]. She said Letta Median has been giving him Morphine regularly. She is wanting to scale back on the morphine to see if that is the issue. Vicky said he agreed to something for mood when she talked to him. Although, he may not remember.  Wife said urine has a foul, strong odor. "smells dead". Not like a UTI. Not drinking a lot so it may be concentrated.

## 2017-02-28 ENCOUNTER — Telehealth: Payer: Self-pay | Admitting: *Deleted

## 2017-02-28 NOTE — Telephone Encounter (Signed)
Spoke to [place] who states the pt is having trouble breathing and seems to be withdrawing; almost sedated. She states she is needing to speak with Dr [name] directly as this is one of his home health pts.

## 2017-02-28 NOTE — Telephone Encounter (Signed)
Spoke with Jocelyn Lamer  Had to call on call last night for cough with white sputum Did take some roxanol last night Today--affect seems off, just kind of staring at her No new meds since my last visit (sertraline started about 2 weeks ago)  Discussed options If not better tomorrow (with roxanol out of his system), will stop the sertraline  Weight is down another 11# in 2 months to 75#

## 2017-03-25 DEATH — deceased

## 2017-12-21 IMAGING — CT CT ABD-PELV W/ CM
2 of 5 series · 16 of 46 positions shown, 18 images · IV contrast (iopamidol)
Comparison: CT 11/26/2015

CLINICAL DATA: New constant RIGHT upper quadrant pain. History of
malignant neoplasm of the stomach.

EXAM:
CT ABDOMEN AND PELVIS WITH CONTRAST
TECHNIQUE: Multidetector CT imaging of the abdomen and pelvis was performed
using the standard protocol following bolus administration of
intravenous contrast.
CONTRAST:  100mL [id number]-[id number] IOPAMIDOL (2C7ZCG-1RR) INJECTION 61%

[Series 2: rtn a/p with · axial · 0.69mm/px · z∈[-500,-125]mm · 13 of 85 slices shown, 15 images]
[im 5/85  soft-tissue]
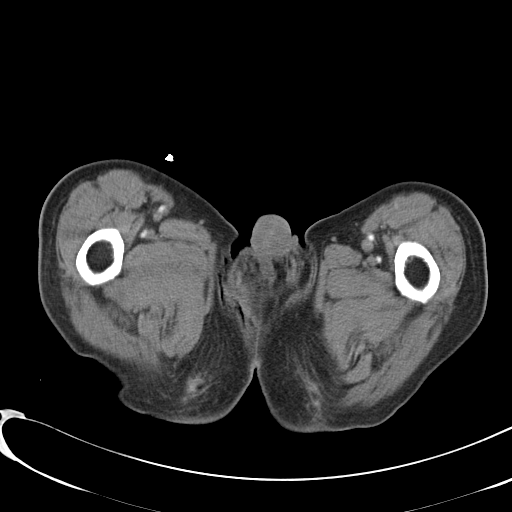
[im 5/85  bone]
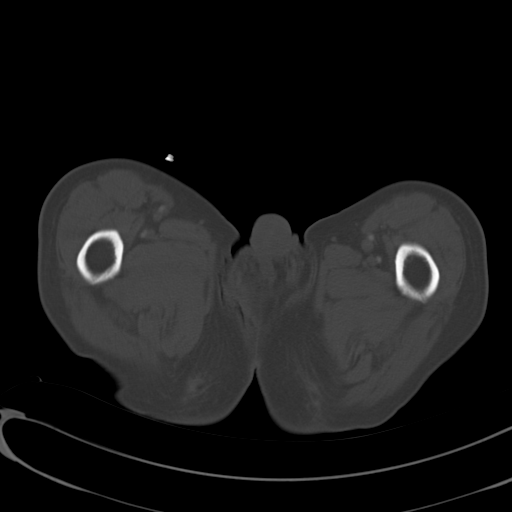
[im 10/85  soft-tissue]
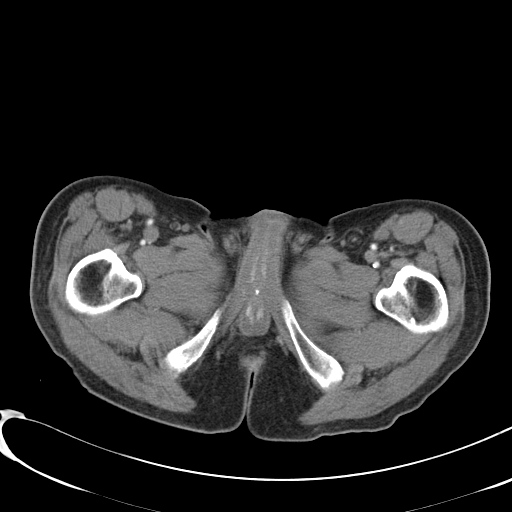
[im 20/85  soft-tissue]
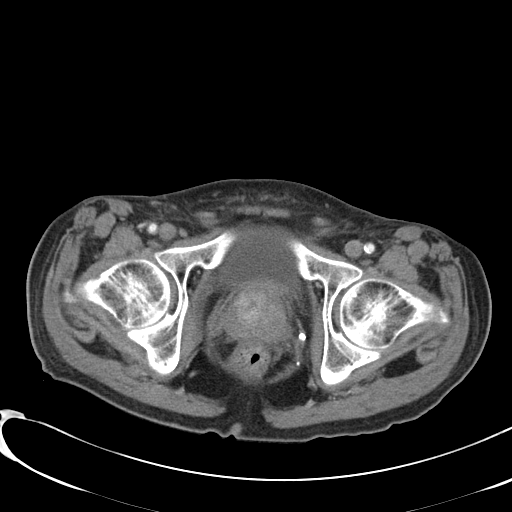
[im 25/85  soft-tissue]
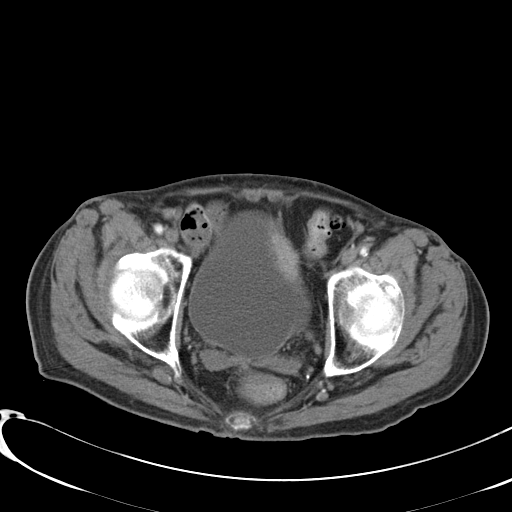
[im 30/85  soft-tissue]
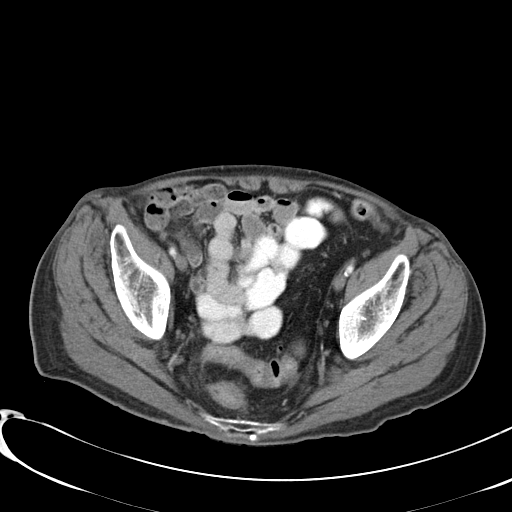
[im 35/85  soft-tissue]
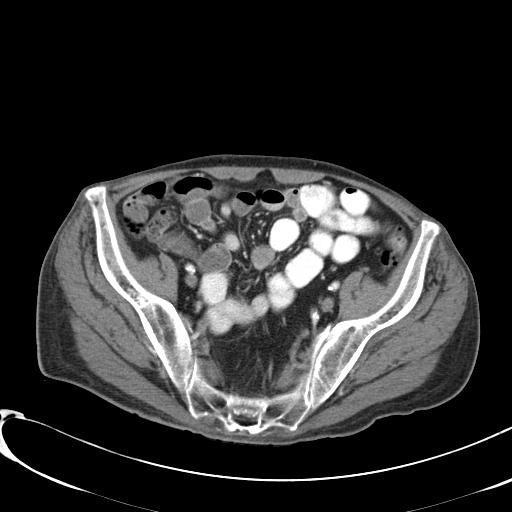
[im 45/85  soft-tissue]
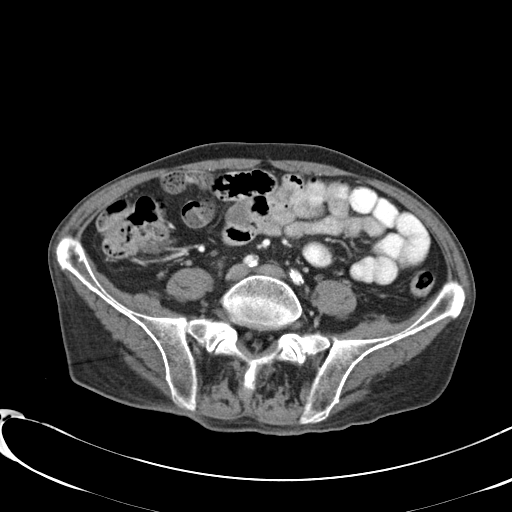
[im 50/85  soft-tissue]
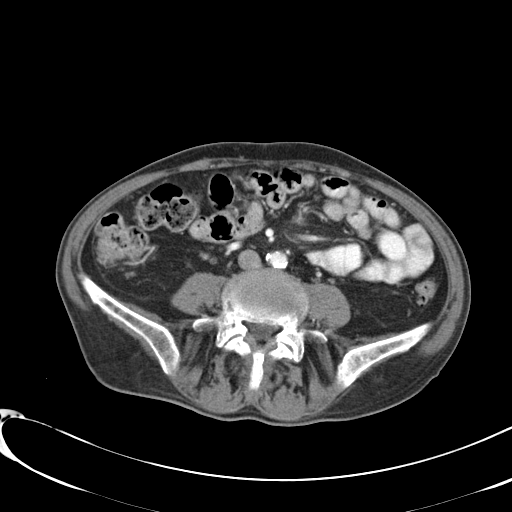
[im 55/85  soft-tissue]
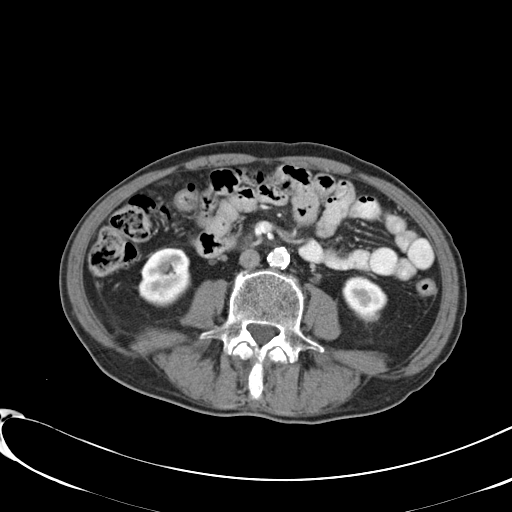
[im 55/85  bone]
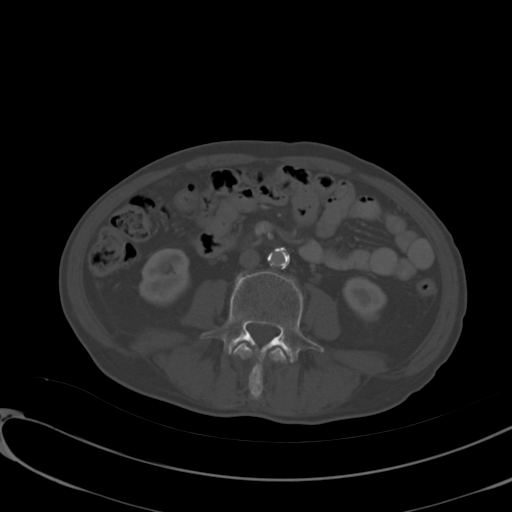
[im 60/85  soft-tissue]
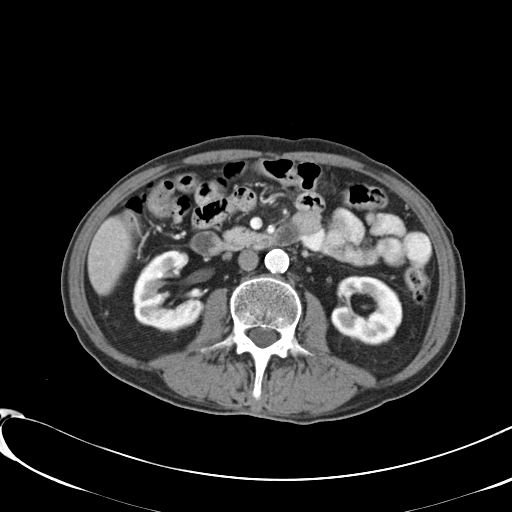
[im 65/85  soft-tissue]
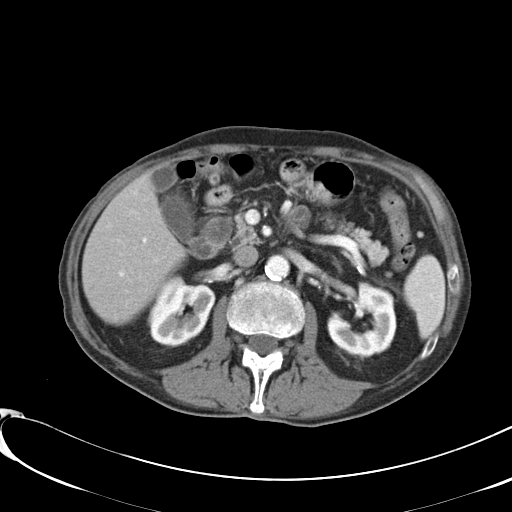
[im 75/85  soft-tissue]
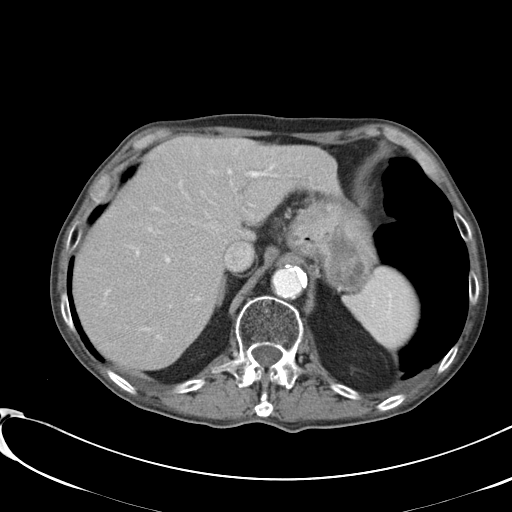
[im 80/85  soft-tissue]
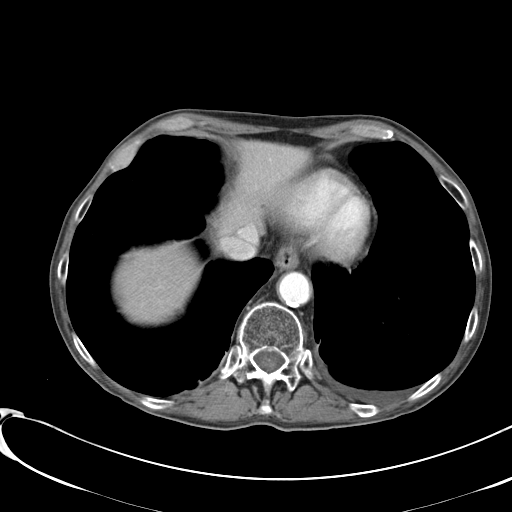

[Series 602: <mpr thick range> · coronal · 0.82mm/px · 3 of 100 slices shown]
[im 34/100  soft-tissue]
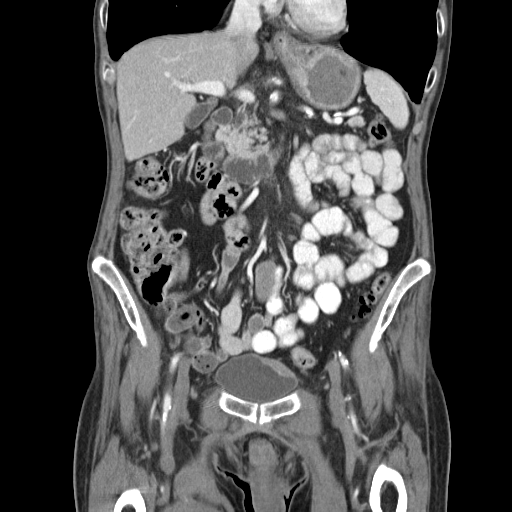
[im 45/100  soft-tissue]
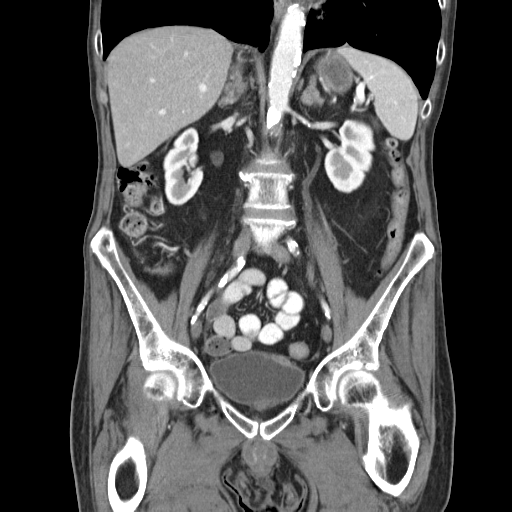
[im 56/100  soft-tissue]
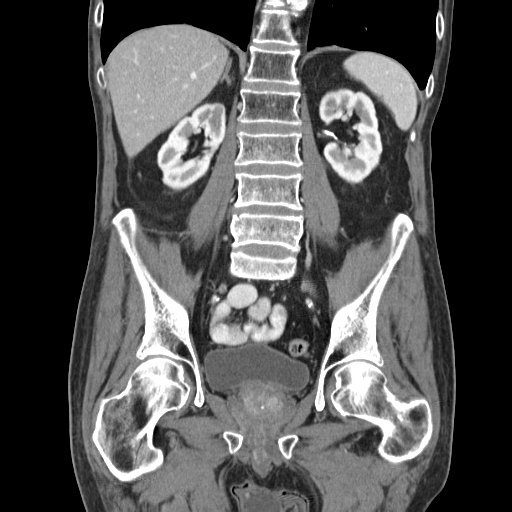

[16 of 46 positions shown; findings below may reference images not displayed]

FINDINGS: Lower chest: Lung bases are clear.

Hepatobiliary:  No focal hepatic lesion.  Normal gallbladder

Pancreas: Mild atrophy of the pancreas. No pancreatic duct
dilatation or lesion.

Spleen: Normal spleen

Adrenals/urinary tract: There is nodular thickening of the adrenal
glands unchanged from prior.

Again noted a broad lesion along the anterior LEFT wall the bladder
measuring 3.6 cm in length along the bladder wall and 1.5 cm in
depth. Lesion is increased in length along the bladder wall compared
to prior. In coronal rotation lesion appears similar (image 38,
series 602)

.

Stomach/Bowel: Again demonstrated irregular thickening along the
gastric cardia extending into the lower esophagus measuring up to
2.5 cm in thickness compared to 2.3 cm on prior for no change. There
is no evidence of gastric outlet obstruction. Gastrojejunostomy
patent. Small bowel appendix and cecum normal. Diverticulum the
descending colon without acute inflammation. Rectosigmoid colon is
normal.

Vascular/Lymphatic: Abdominal aorta is normal caliber with
atherosclerotic calcification. There is no retroperitoneal or
periportal lymphadenopathy. No pelvic lymphadenopathy.

Reproductive: Enhancement of the transitional zone of prostate
gland. No change.

Other: No free fluid.

Musculoskeletal: No aggressive osseous lesion.
IMPRESSION: 1. Irregular thickening of the gastric wall involving the gastric
cardia and the gastroesophageal junction consistent with gastric
carcinoma.
2. Nodular thickening of the adrenal glands is indeterminate but
similar to comparison exam.
3. Broad mucosal lesion involving the anterior wall of the bladder.
Recommend cystoscopy for further evaluation.
